# Patient Record
Sex: Female | Born: 1960
Health system: Southern US, Community
[De-identification: ages and names within clinical notes are randomized; demographics above are authoritative.]

## PROBLEM LIST (undated history)

## (undated) DIAGNOSIS — F329 Major depressive disorder, single episode, unspecified: Secondary | ICD-10-CM

## (undated) DIAGNOSIS — A549 Gonococcal infection, unspecified: Secondary | ICD-10-CM

## (undated) DIAGNOSIS — Z8742 Personal history of other diseases of the female genital tract: Secondary | ICD-10-CM

## (undated) DIAGNOSIS — R197 Diarrhea, unspecified: Secondary | ICD-10-CM

## (undated) DIAGNOSIS — M109 Gout, unspecified: Secondary | ICD-10-CM

## (undated) DIAGNOSIS — F419 Anxiety disorder, unspecified: Secondary | ICD-10-CM

## (undated) DIAGNOSIS — Z8619 Personal history of other infectious and parasitic diseases: Secondary | ICD-10-CM

## (undated) DIAGNOSIS — K219 Gastro-esophageal reflux disease without esophagitis: Secondary | ICD-10-CM

## (undated) DIAGNOSIS — J449 Chronic obstructive pulmonary disease, unspecified: Secondary | ICD-10-CM

## (undated) DIAGNOSIS — Z8489 Family history of other specified conditions: Secondary | ICD-10-CM

## (undated) DIAGNOSIS — R87619 Unspecified abnormal cytological findings in specimens from cervix uteri: Secondary | ICD-10-CM

## (undated) DIAGNOSIS — F4323 Adjustment disorder with mixed anxiety and depressed mood: Secondary | ICD-10-CM

## (undated) DIAGNOSIS — M722 Plantar fascial fibromatosis: Secondary | ICD-10-CM

## (undated) DIAGNOSIS — F32A Depression, unspecified: Secondary | ICD-10-CM

## (undated) DIAGNOSIS — I10 Essential (primary) hypertension: Secondary | ICD-10-CM

## (undated) HISTORY — PX: OTHER SURGICAL HISTORY: SHX169

## (undated) HISTORY — PX: TUBAL LIGATION: SHX77

---

## 2006-06-17 ENCOUNTER — Emergency Department: Payer: Self-pay | Admitting: Internal Medicine

## 2007-06-09 ENCOUNTER — Emergency Department: Payer: Self-pay | Admitting: Emergency Medicine

## 2007-07-18 ENCOUNTER — Emergency Department: Payer: Self-pay | Admitting: Emergency Medicine

## 2007-07-18 ENCOUNTER — Other Ambulatory Visit: Payer: Self-pay

## 2007-07-18 IMAGING — CR DG CHEST 1V
1 series · 1 of 1 positions shown · non-contrast
Comparison: none

REASON FOR EXAM: COUGH
COMMENTS:

PROCEDURE:     DXR - DXR CHEST 1 VIEWAP OR PA  - [DATE]  [DATE]
RESULT:     The lungs are mildly hyperinflated. There is no focal
infiltrate. The heart is normal in size. The pulmonary vascularity is not
engorged.

[view not recorded]
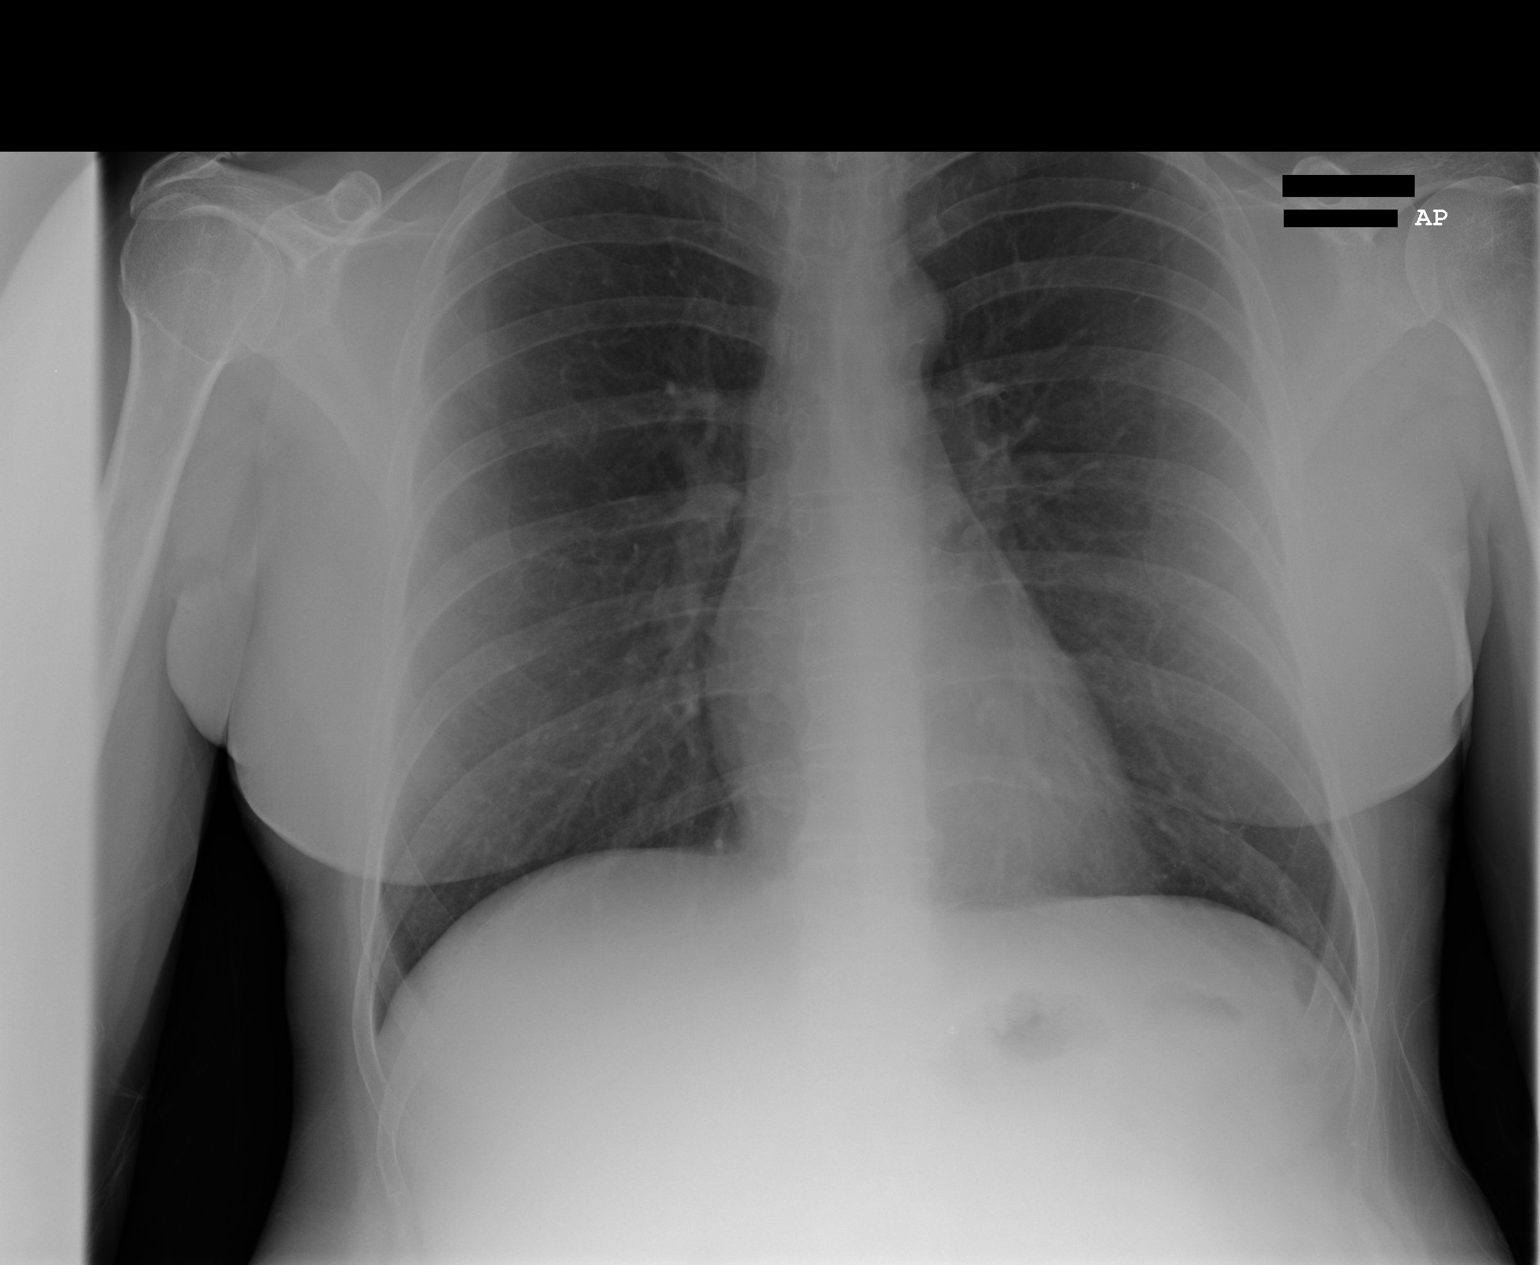

[1 of 1 positions shown; findings below may reference images not displayed]

IMPRESSION: There is hyperinflation consistent with COPD or reactive
airway disease. I do not see evidence of CHF nor of pneumonia.

## 2007-07-18 IMAGING — CT CT HEAD WITHOUT CONTRAST
2 series · 16 of 30 positions shown, 20 images · non-contrast
Comparison: none

REASON FOR EXAM: DIZZINESS, HEADACHE
COMMENTS:

PROCEDURE:     CT  - CT HEAD WITHOUT CONTRAST  - [DATE]  [DATE]
RESULT:     The patient has a history of dizziness and headache.
TECHNIQUE: Nonenhanced head CT is performed.
There are no prior studies available for comparison.

[Series 2: without · axial · non-contrast · 0.39mm/px · z∈[-185,-65]mm · 13 of 30 slices shown, 17 images]
[im 3/30  brain]
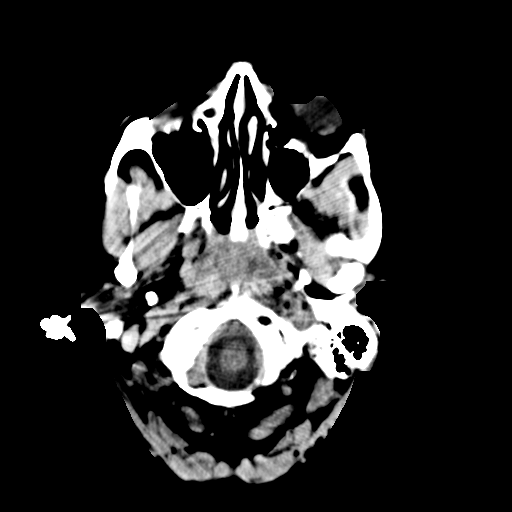
[im 3/30  bone]
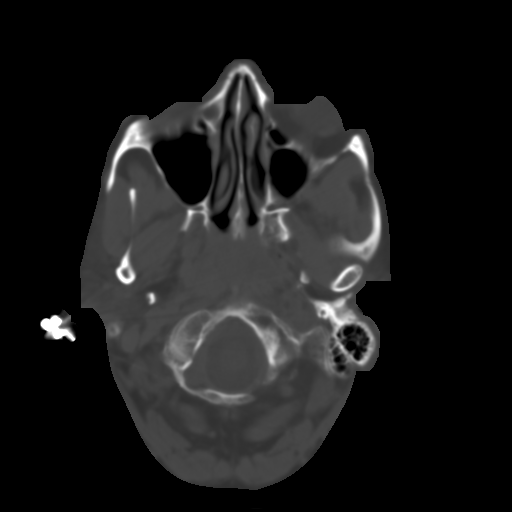
[im 5/30  brain]
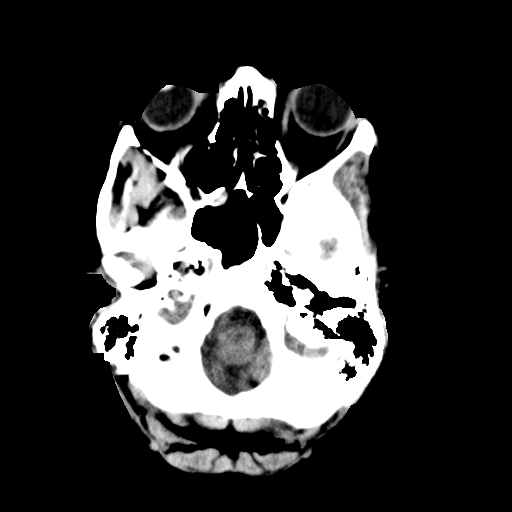
[im 7/30  brain]
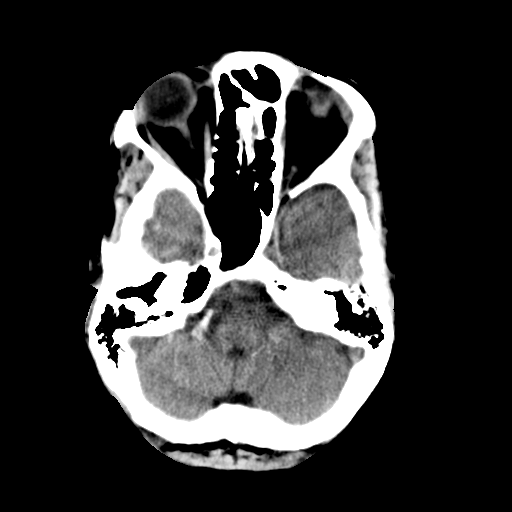
[im 9/30  brain]
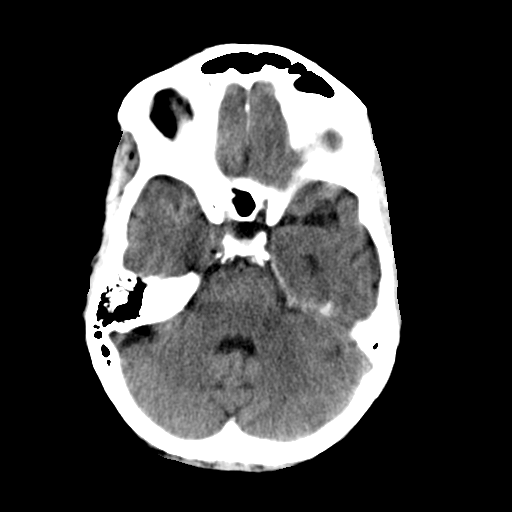
[im 11/30  brain]
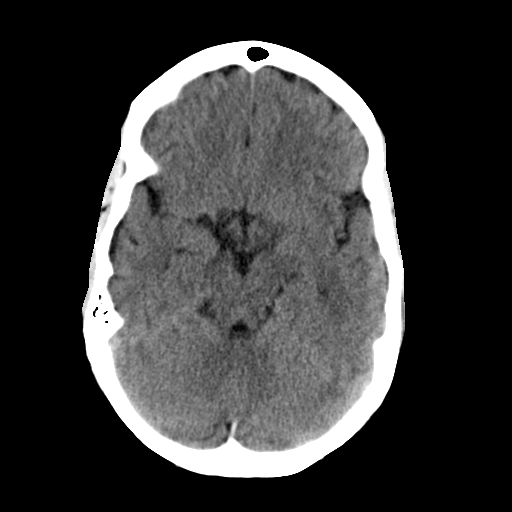
[im 11/30  bone]
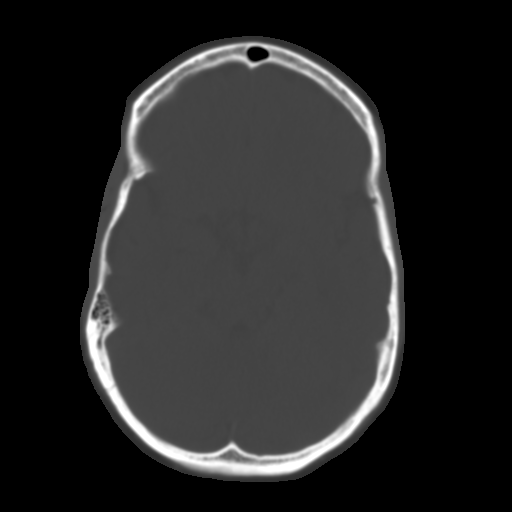
[im 13/30  brain]
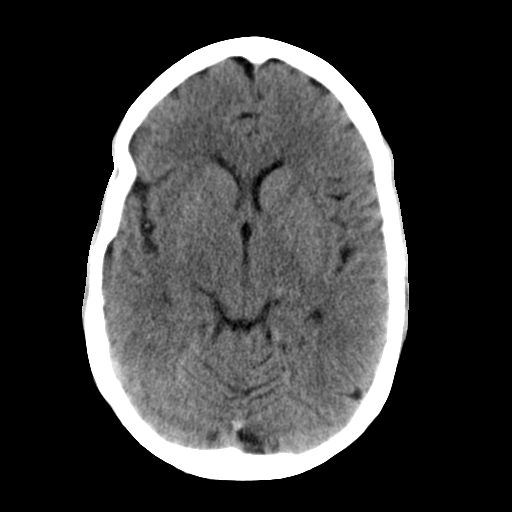
[im 15/30  brain]
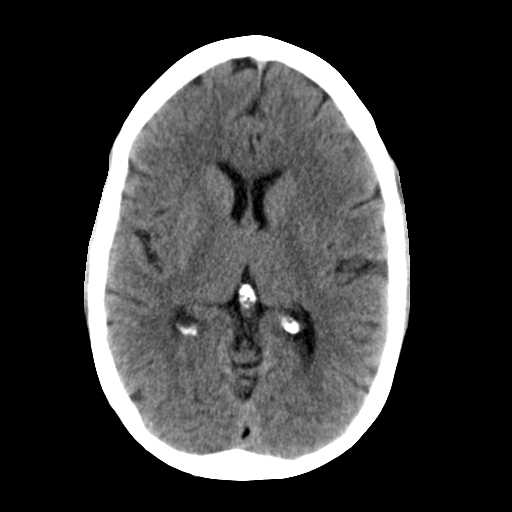
[im 17/30  brain]
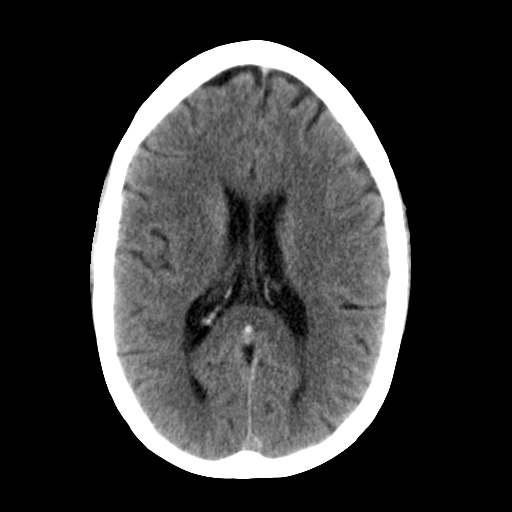
[im 19/30  brain]
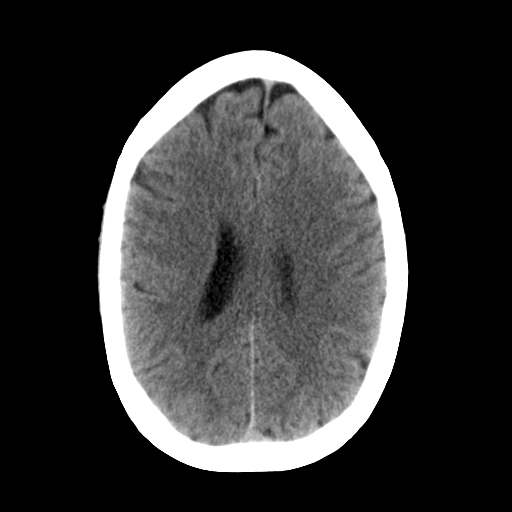
[im 19/30  bone]
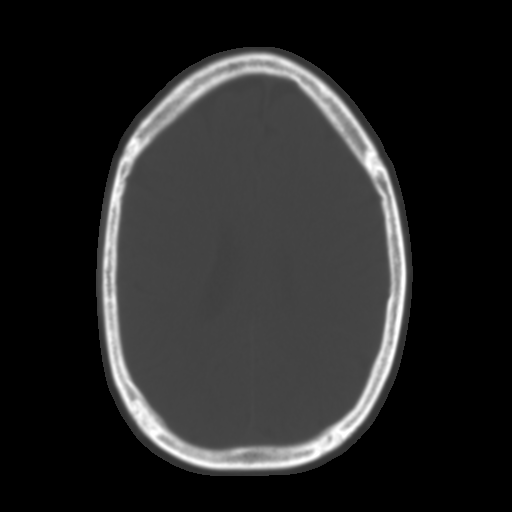
[im 21/30  brain]
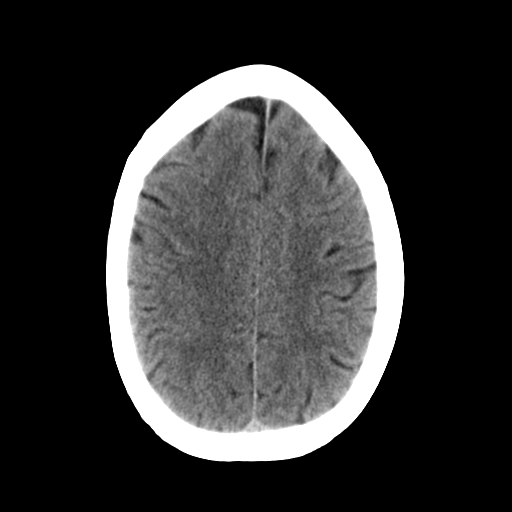
[im 23/30  brain]
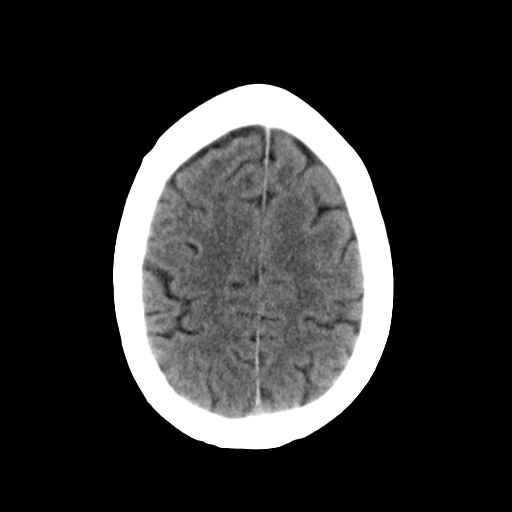
[im 25/30  brain]
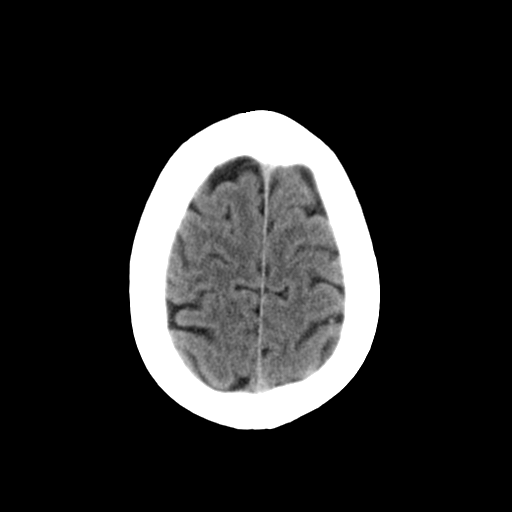
[im 27/30  brain]
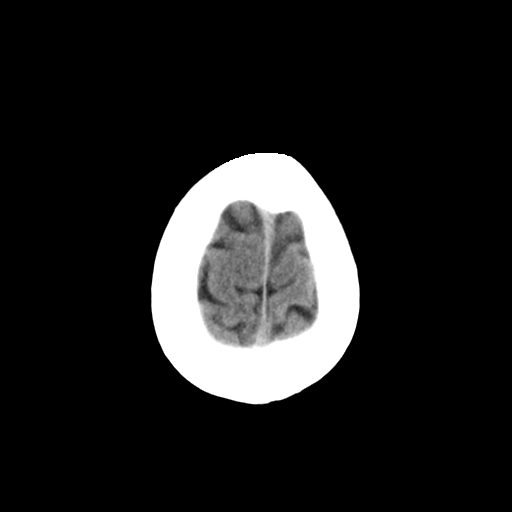
[im 27/30  bone]
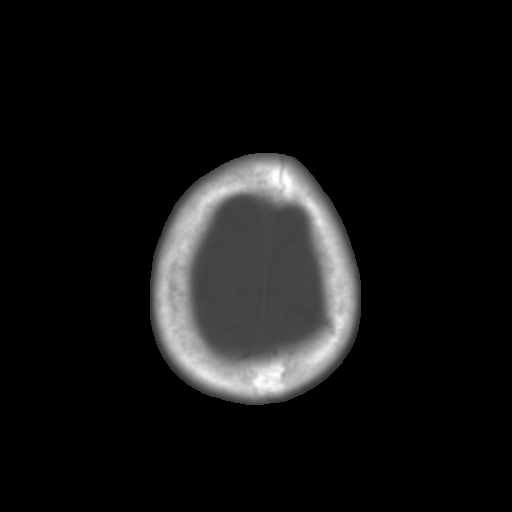

[Series 3: bone · axial · 0.39mm/px · z∈[-185,-145]mm · 3 of 30 slices shown]
[im 3/30  bone]
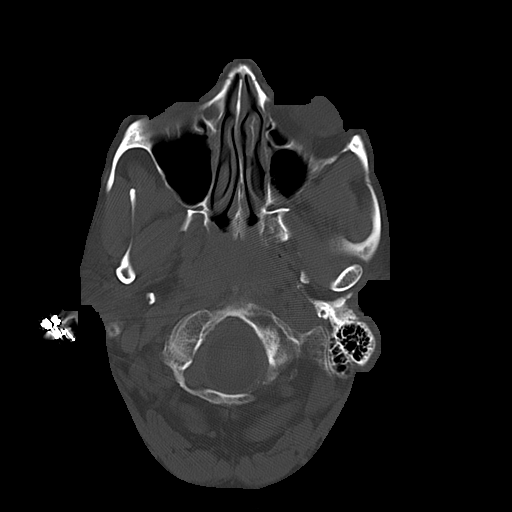
[im 7/30  bone]
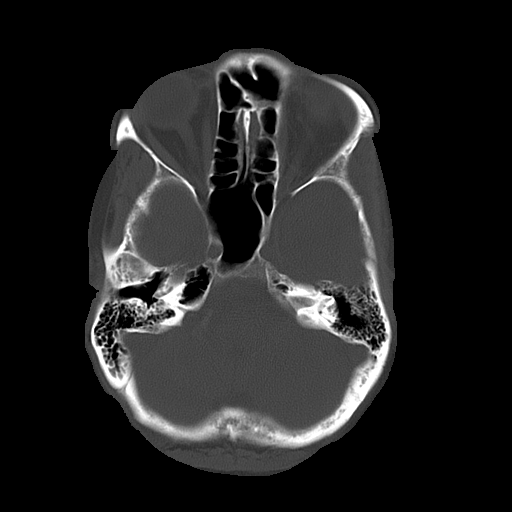
[im 11/30  bone]
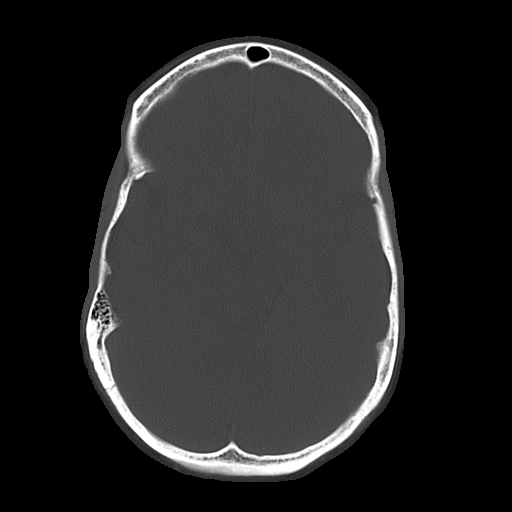

[16 of 30 positions shown; findings below may reference images not displayed]

FINDINGS: No intra-axial or extra-axial pathologic fluid or blood
collections are identified. No mass lesion is noted. There is no
hydrocephalus. A mucous retention cyst is noted in the LEFT maxillary sinus.
IMPRESSION: No acute intracranial abnormality is identified.

## 2007-10-15 ENCOUNTER — Emergency Department: Payer: Self-pay | Admitting: Emergency Medicine

## 2007-10-15 IMAGING — CR SACRUM AND COCCYX - 2+ VIEW
1 series · 4 of 4 positions shown · non-contrast
Comparison: none

REASON FOR EXAM: Fall, injury
COMMENTS:   LMP: 3 years ago

[Series 1: view not recorded · 0.17mm/px · 4 of 4 slices shown]
[im 1/4]
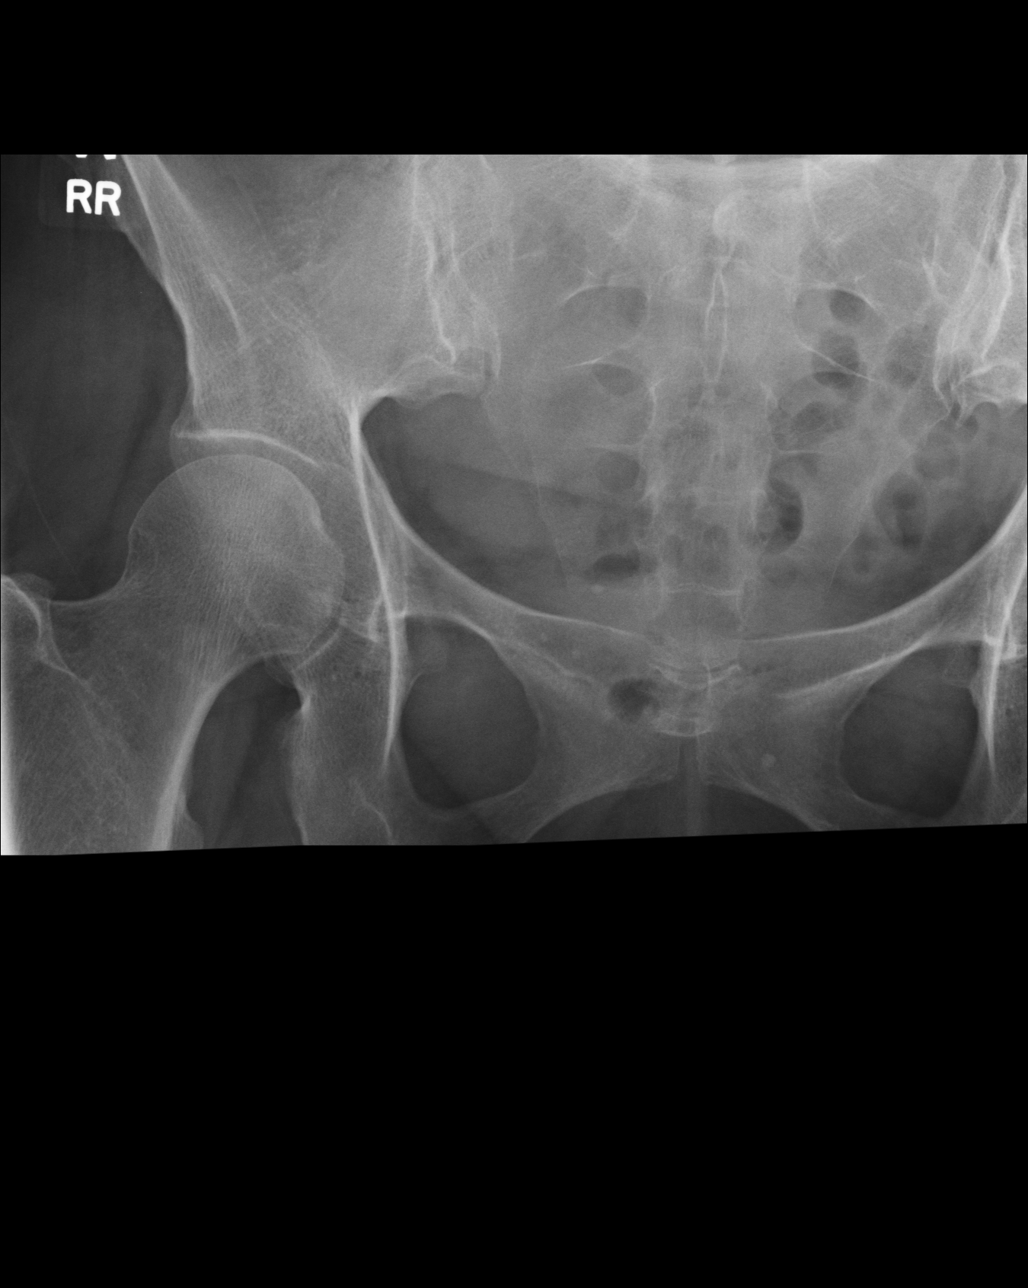
[im 2/4]
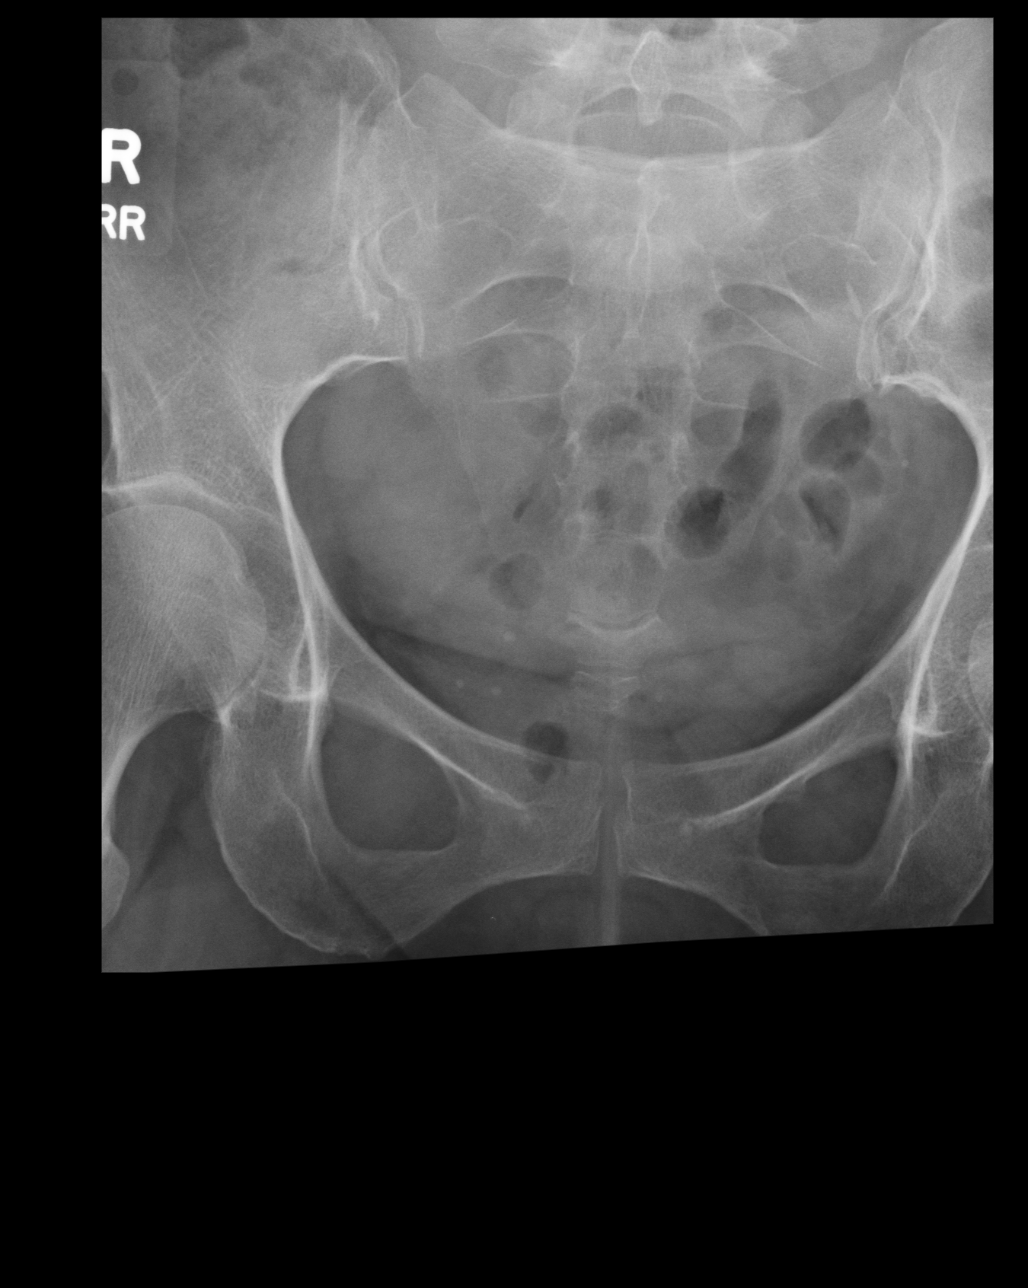
[im 3/4]
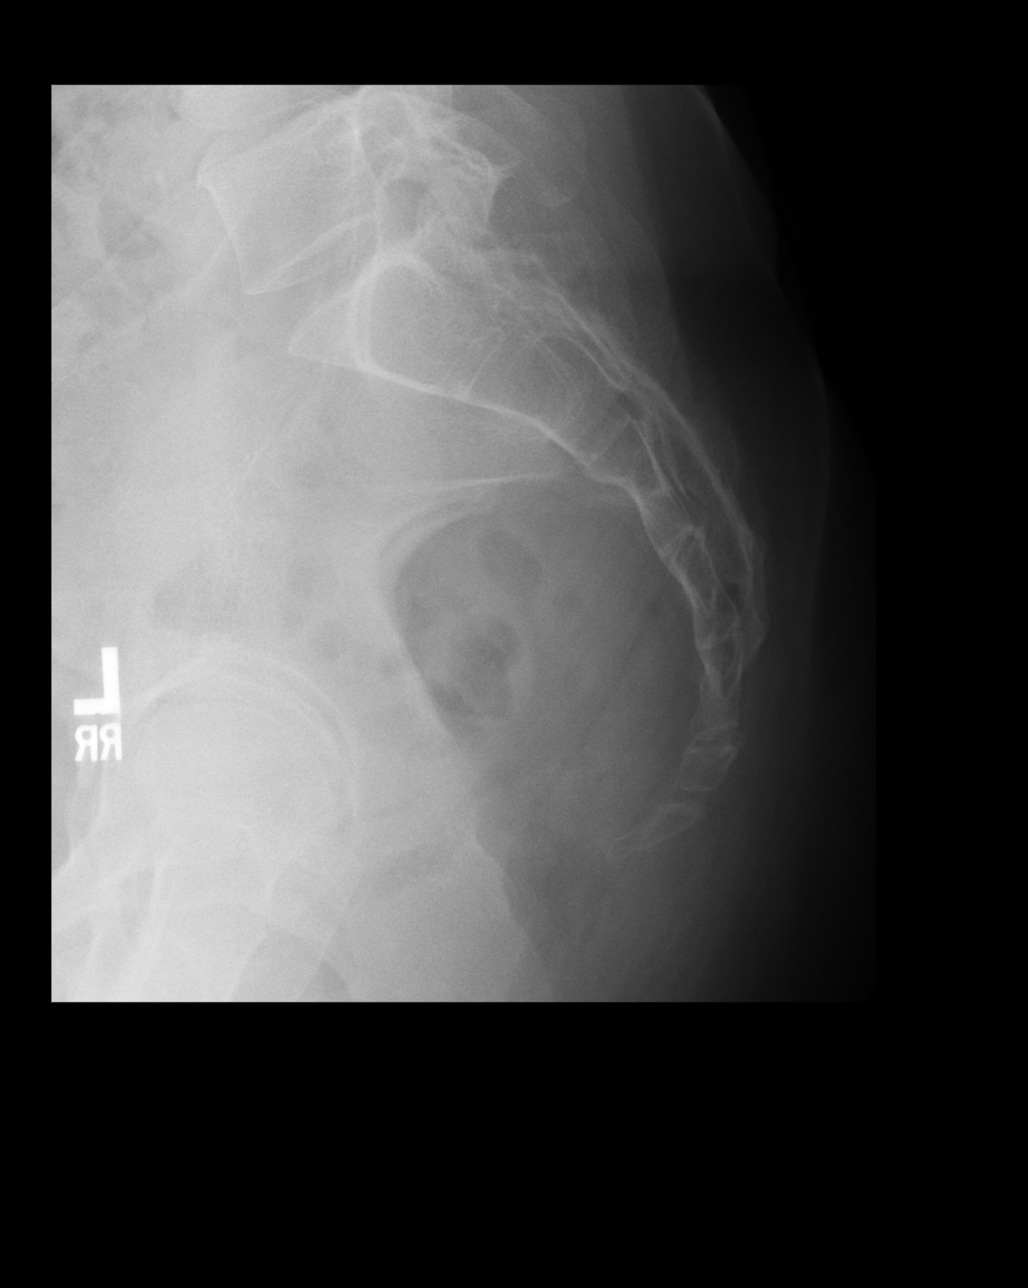
[im 4/4]
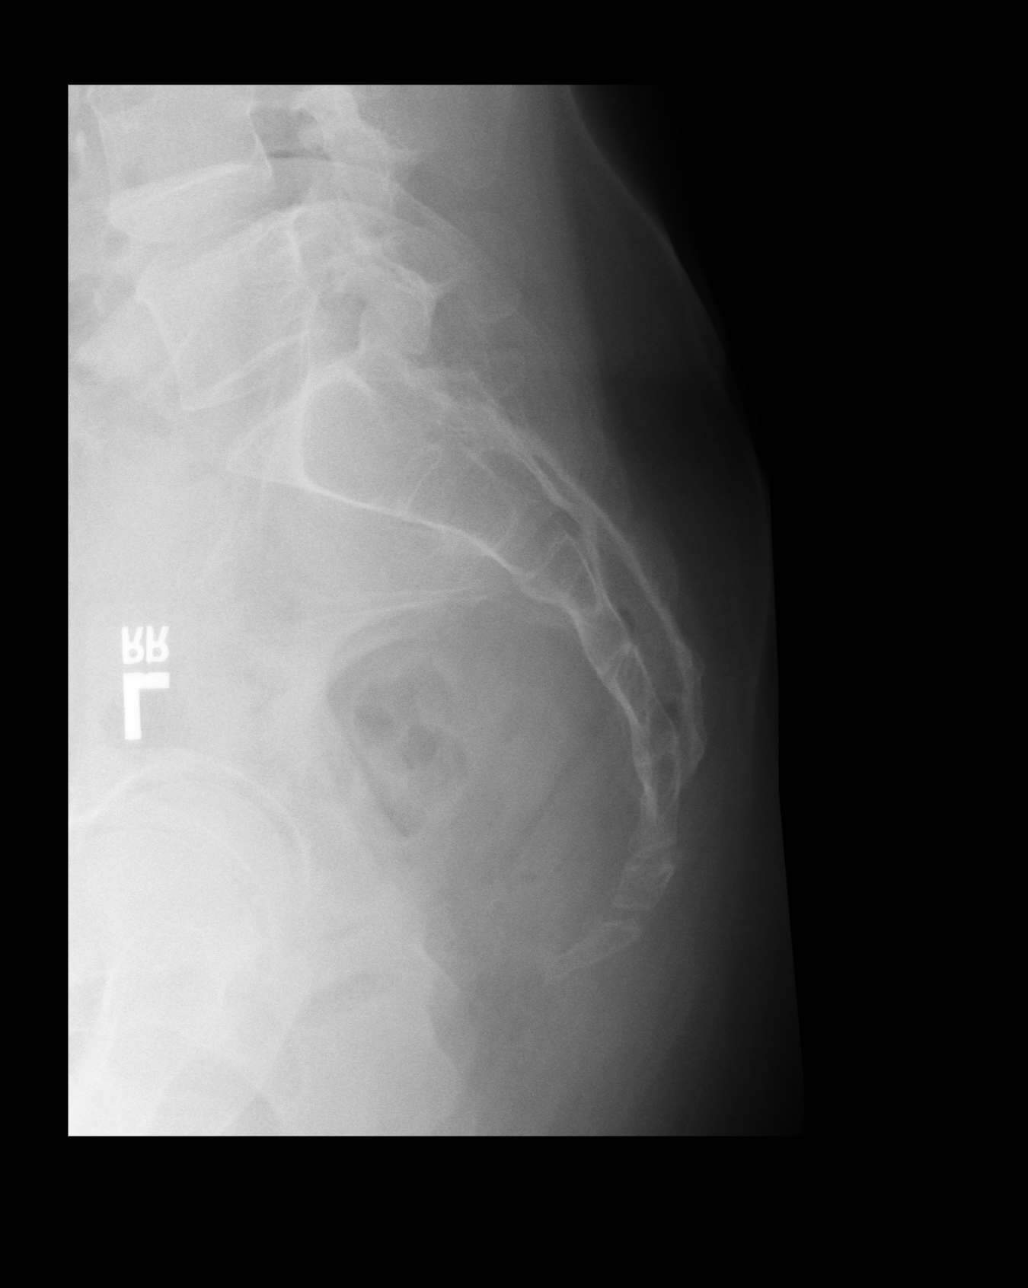

[4 of 4 positions shown; findings below may reference images not displayed]

PROCEDURE:     DXR - DXR SACRUM AND COCCYX  - [DATE]  [DATE]

RESULT:     Anterior and lateral views of the sacrum and coccyx are
obtained.

There is no previous exam available for comparison.

The sacral arches appear to be intact. The sacral and sacrococcygeal
alignment appears to be normal. No foreign body is evident.
IMPRESSION: No acute radiographic abnormality.

## 2007-10-30 ENCOUNTER — Emergency Department: Payer: Self-pay | Admitting: Emergency Medicine

## 2007-12-06 ENCOUNTER — Ambulatory Visit: Payer: Self-pay

## 2007-12-06 IMAGING — MG MAM [PERSON_NAME] DIG SCREEN W/CAD
1 series · 4 of 4 positions shown · non-contrast
Comparison: none

REASON FOR EXAM: Annual screening
COMMENTS:

[R CC · right · 4 of 4 slices shown]
[im 1/4]
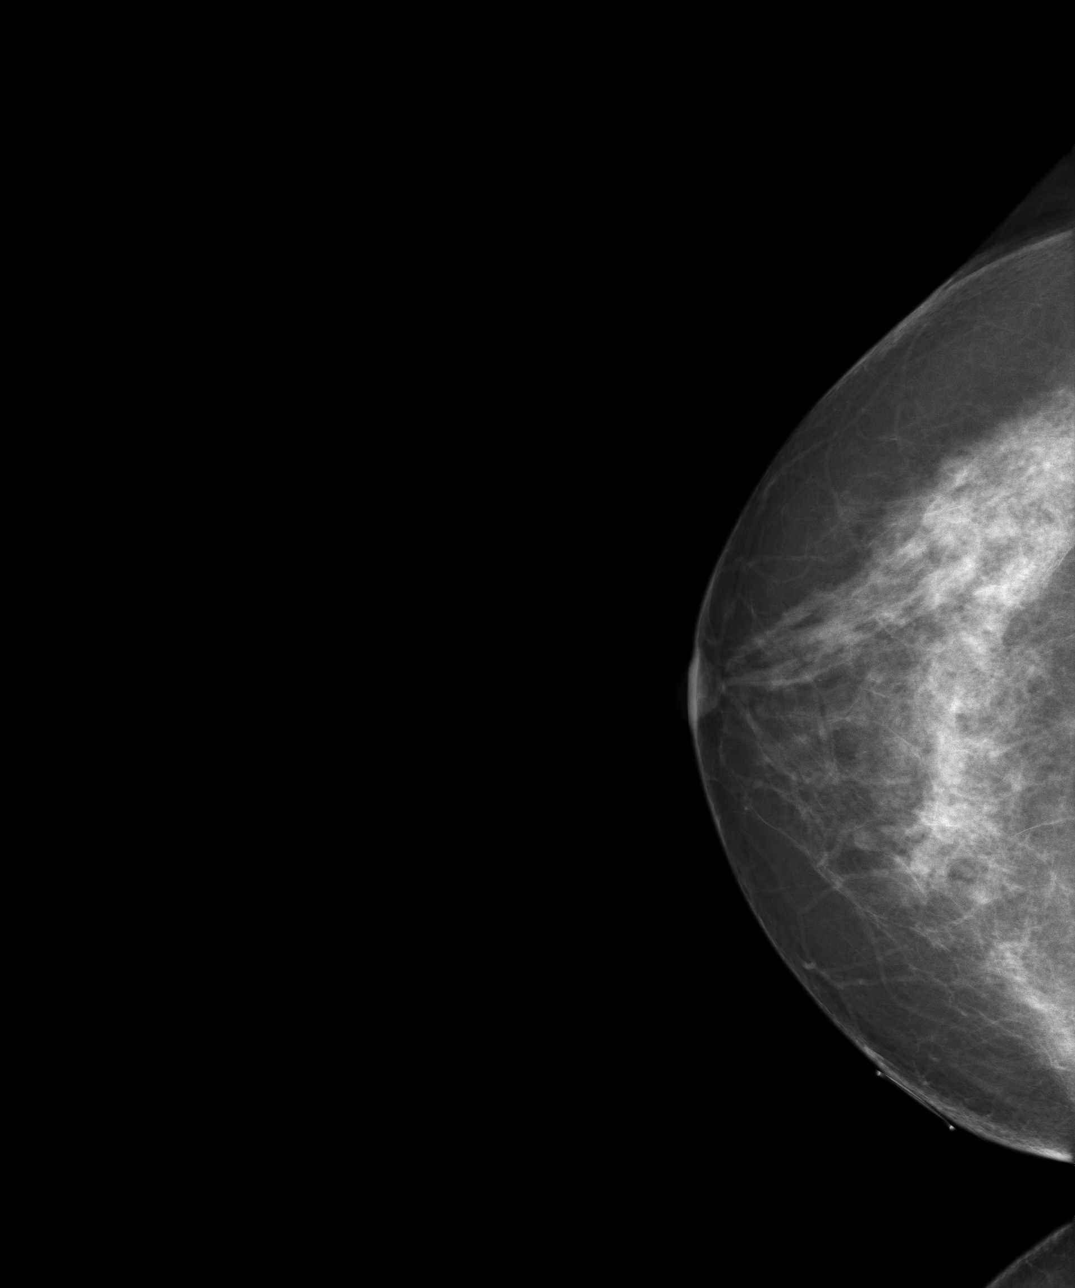
[im 2/4]
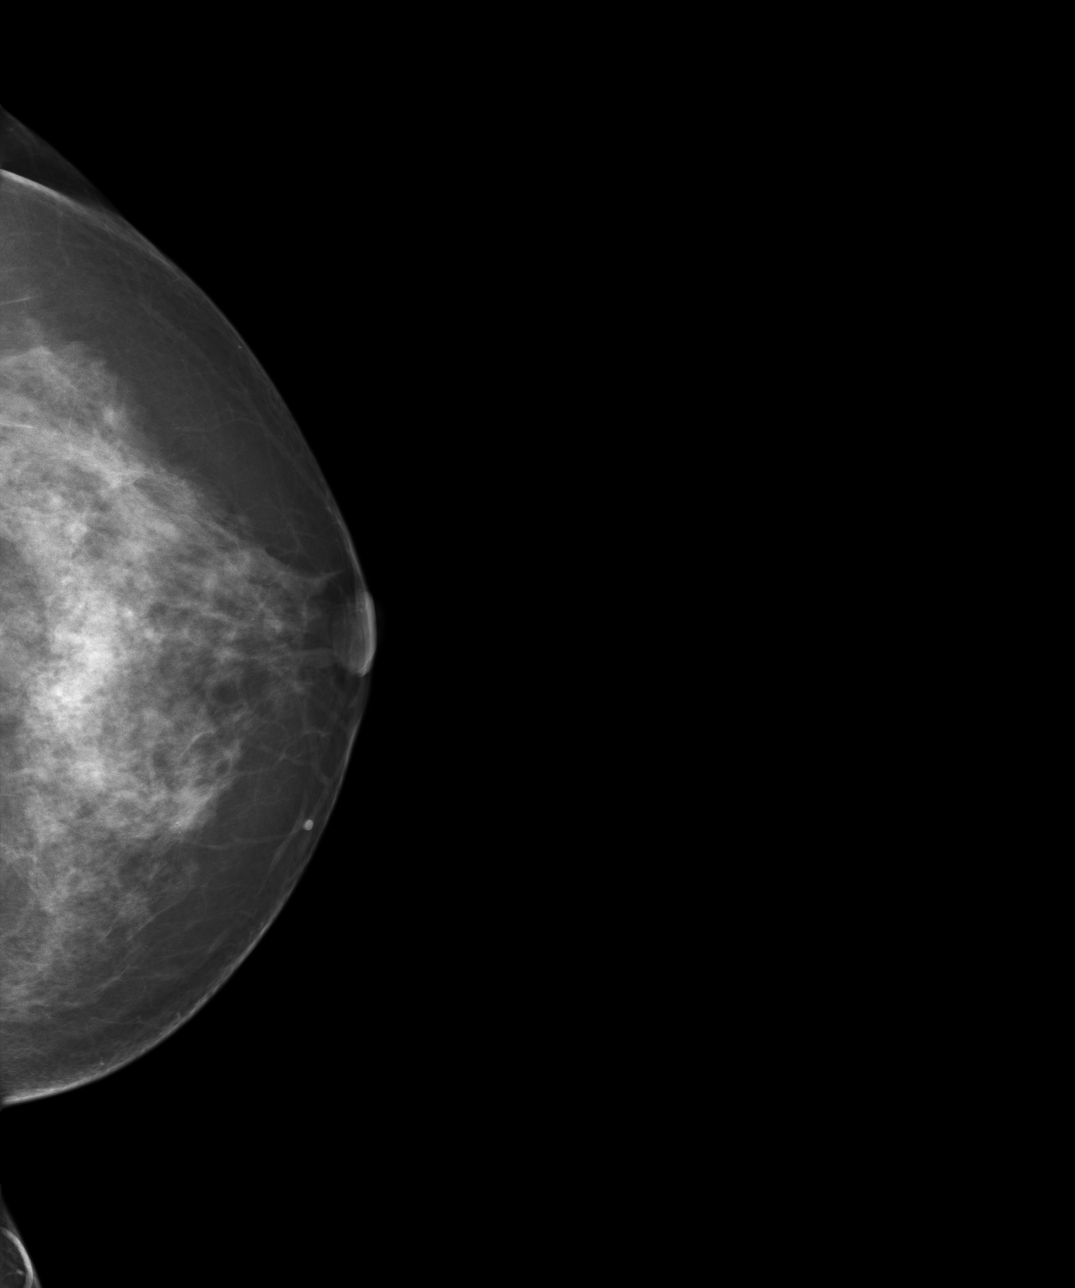
[im 3/4]
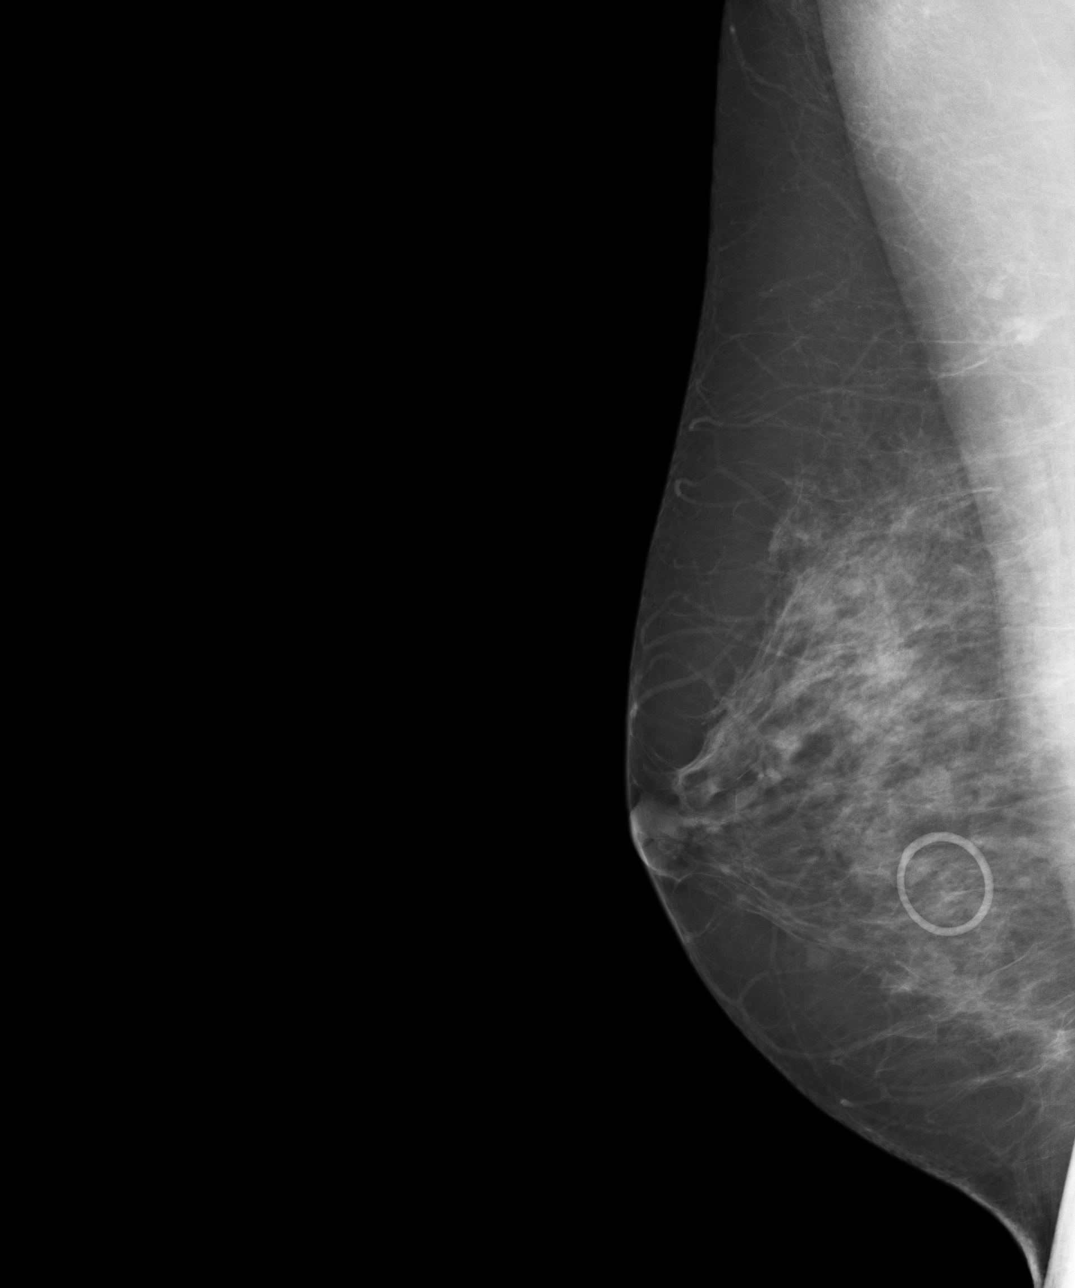
[im 4/4]
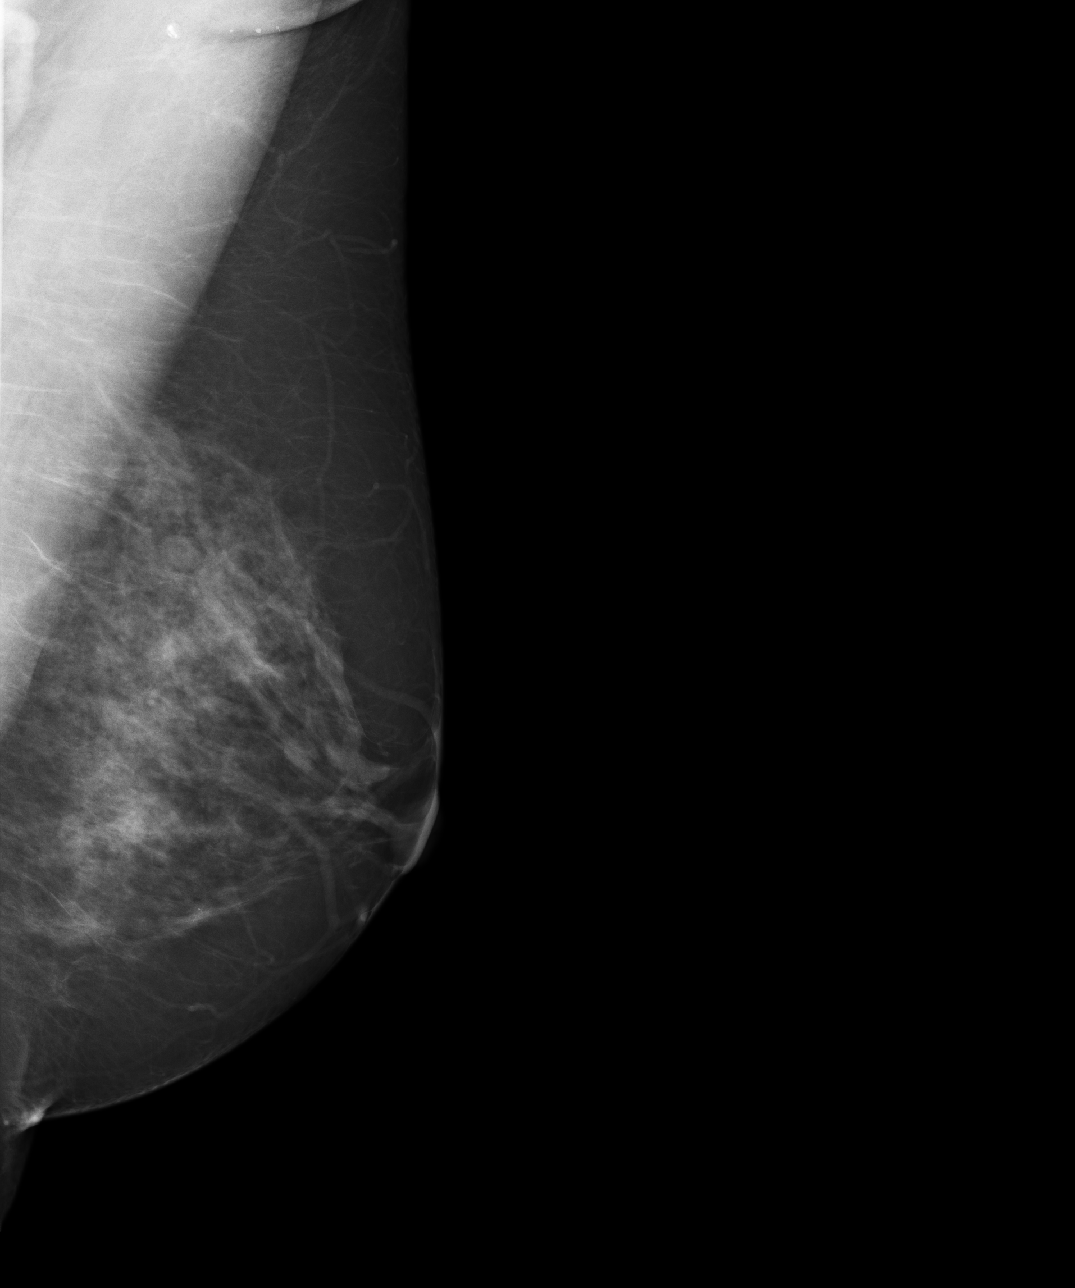

[4 of 4 positions shown; findings below may reference images not displayed]

PROCEDURE:     MAM - MAM [REDACTED] [facility] DIG SCREEN W/CAD  - [DATE]  [DATE]

RESULT:     Comparison is made to prior outside examinations dated
[DATE] and [DATE] from [facility].

The breast parenchyma is heterogeneously dense. No mass or malignant
appearing microcalcifications are seen.
IMPRESSION: 1.     Bilaterally benign appearing screening mammography.
2.     Annual screening mammography is recommended.
3.     BI-RADS: Category 1  Negative.

Thank you for this opportunity to contribute to the care of your patient.

A NEGATIVE MAMMOGRAM REPORT DOES NOT PRECLUDE BIOPSY OR OTHER EVALUATION OF
A CLINICALLY PALPABLE OR OTHERWISE SUSPICIOUS MASS OR LESION. BREAST CANCER
MAY NOT BE DETECTED BY MAMMOGRAPHY IN UP TO 10% OF CASES.

## 2007-12-09 ENCOUNTER — Emergency Department: Payer: Self-pay | Admitting: Emergency Medicine

## 2007-12-09 IMAGING — CR RIGHT HAND - COMPLETE 3+ VIEW
1 series · 3 of 3 positions shown · non-contrast
Comparison: none

REASON FOR EXAM: fall
COMMENTS:

[Series 1: view not recorded · 0.17mm/px · 3 of 3 slices shown]
[im 1/3]
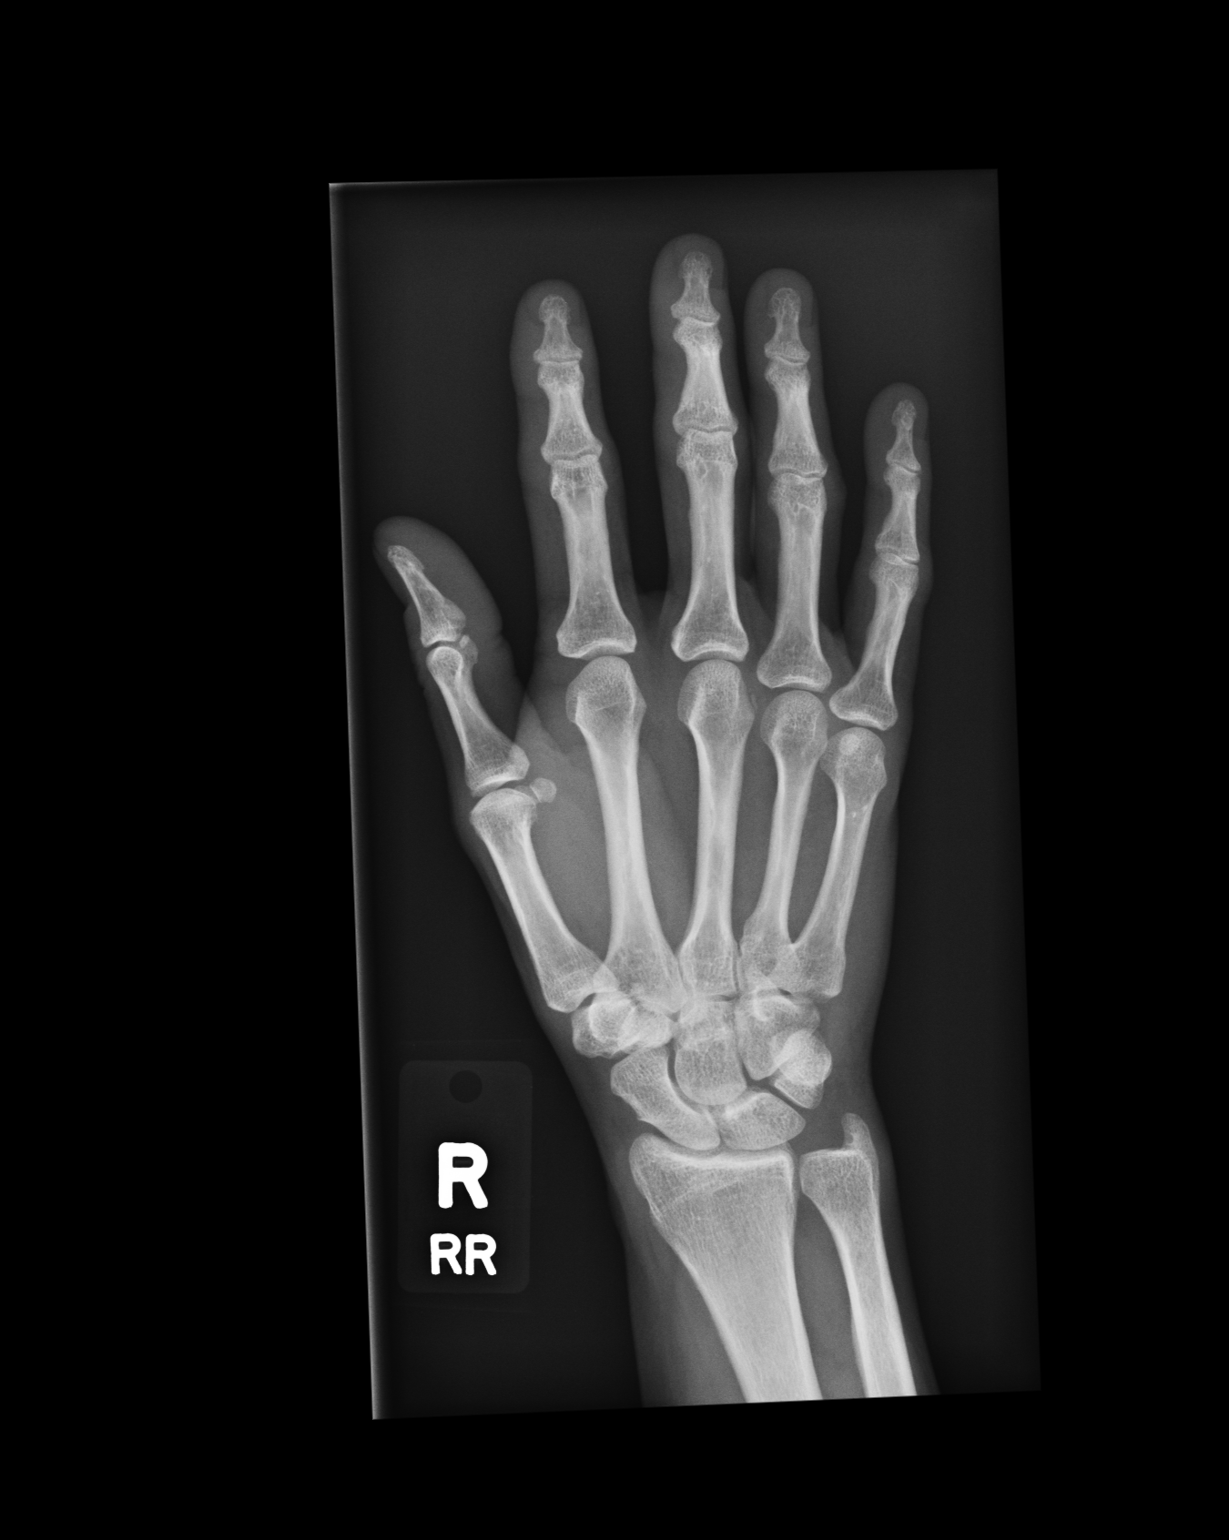
[im 2/3]
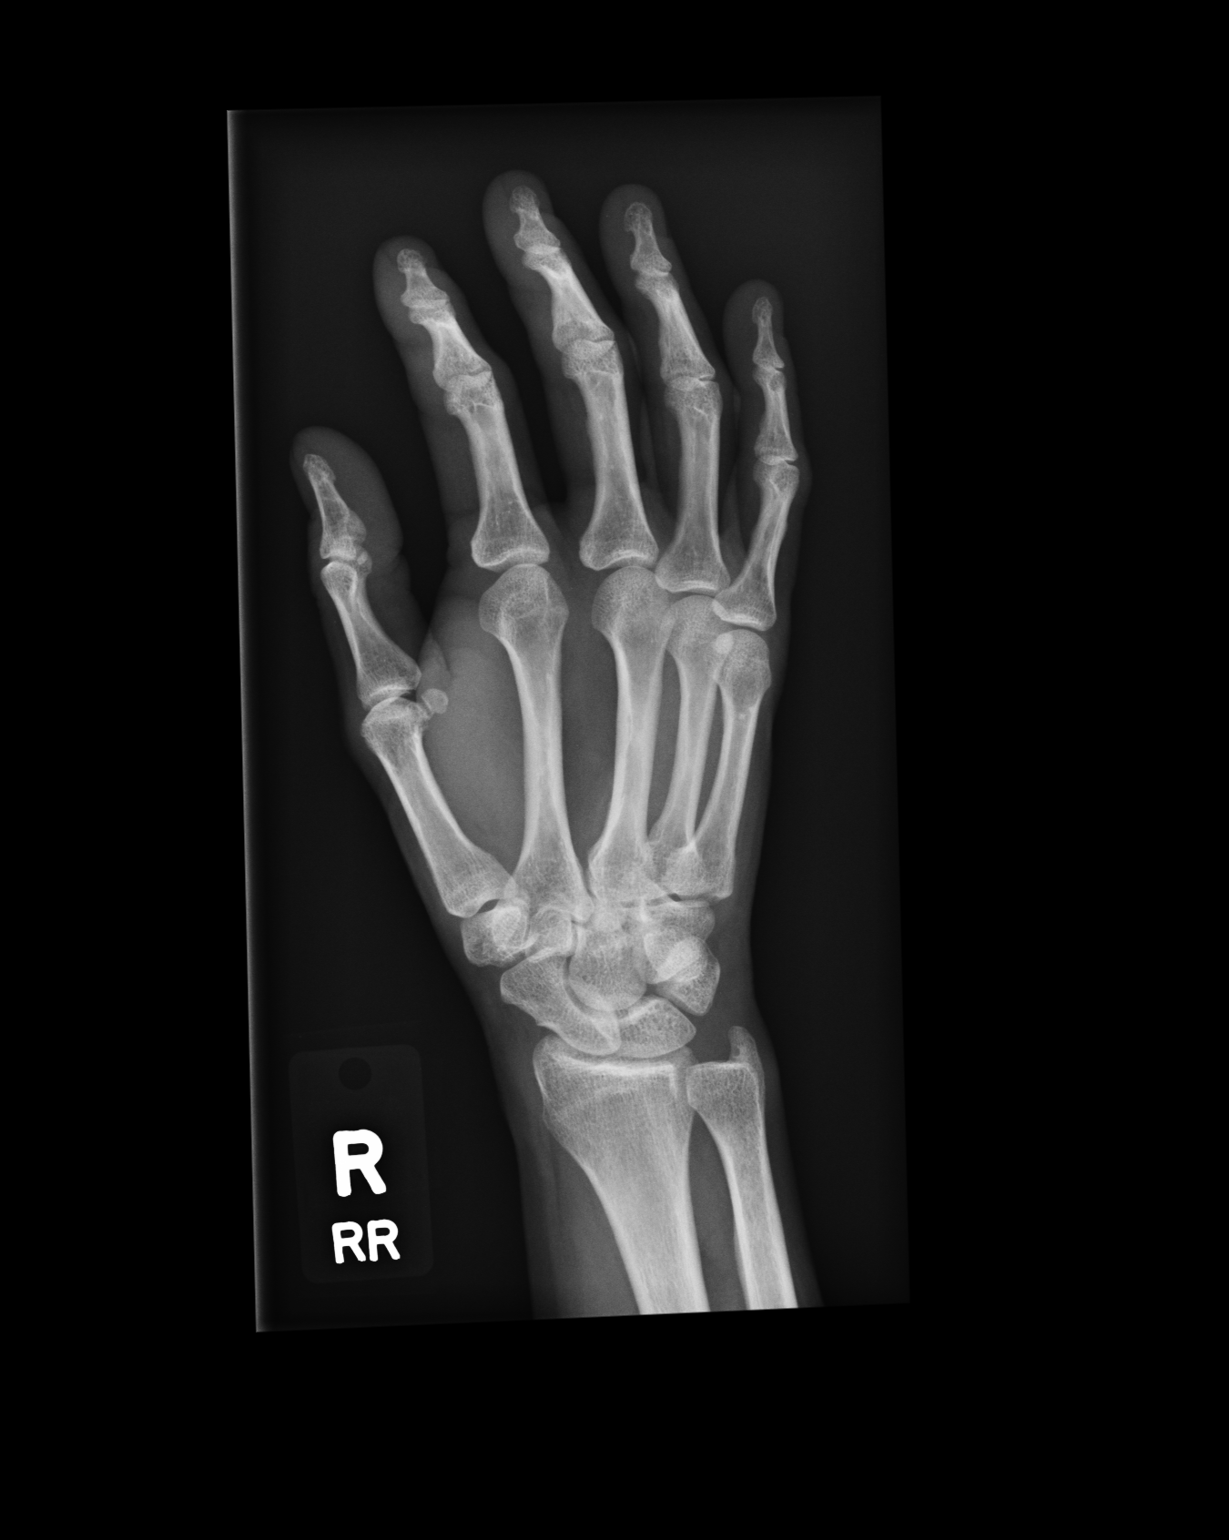
[im 3/3]
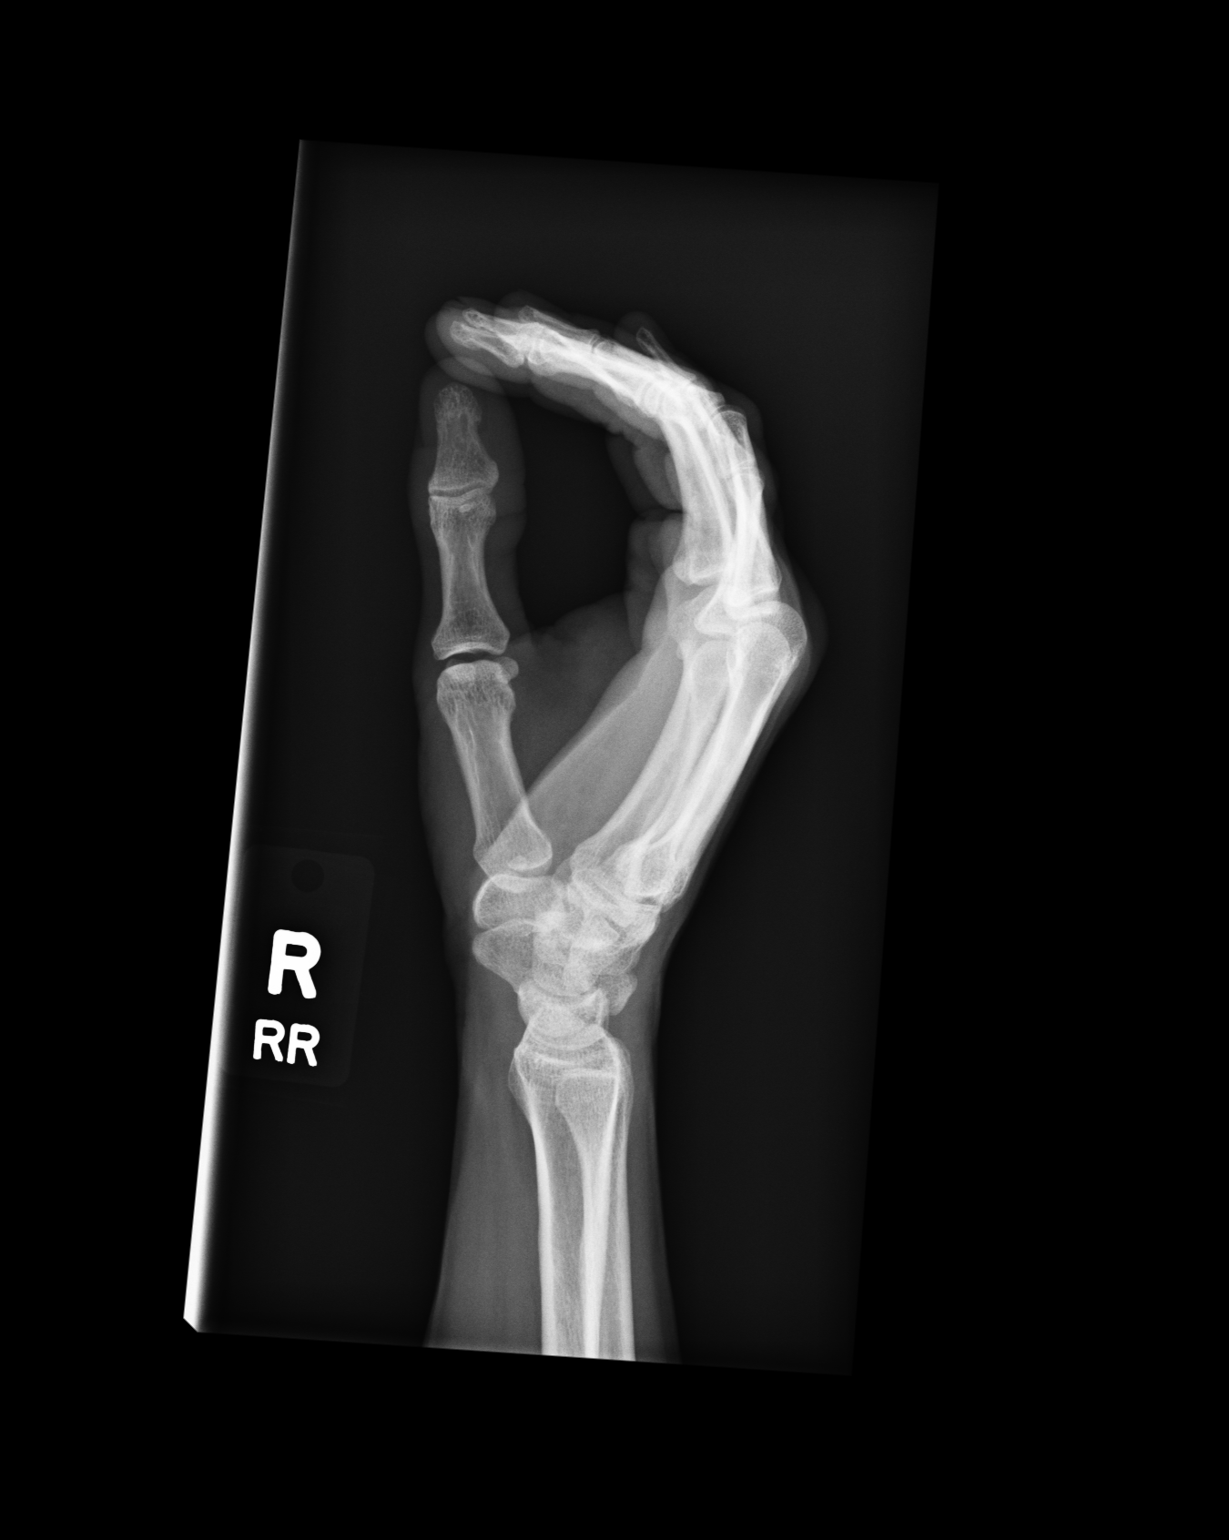

[3 of 3 positions shown; findings below may reference images not displayed]

PROCEDURE:     DXR - DXR HAND RT COMPLETE W/OBLIQUES  - [DATE]  [DATE]

RESULT:     A small fleck of osseous density projects along the ulnar aspect
of the distal third metacarpal.  This may represent a small avulsion
fragment particularly if there is correlation with the patient's signs and
symptoms.  No further evidence of fracture or dislocation is appreciated.
IMPRESSION: Possible distal third metacarpal avulsion fracture as described above.

## 2008-06-29 ENCOUNTER — Emergency Department: Payer: Self-pay | Admitting: Emergency Medicine

## 2008-06-29 IMAGING — CR DG CHEST 2V
1 series · 2 of 2 positions shown · non-contrast
Comparison: none

REASON FOR EXAM: cough chest pain
COMMENTS:   LMP: Post-Menopausal

[Series 1: view not recorded · 0.17mm/px · 2 of 2 slices shown]
[im 1/2]
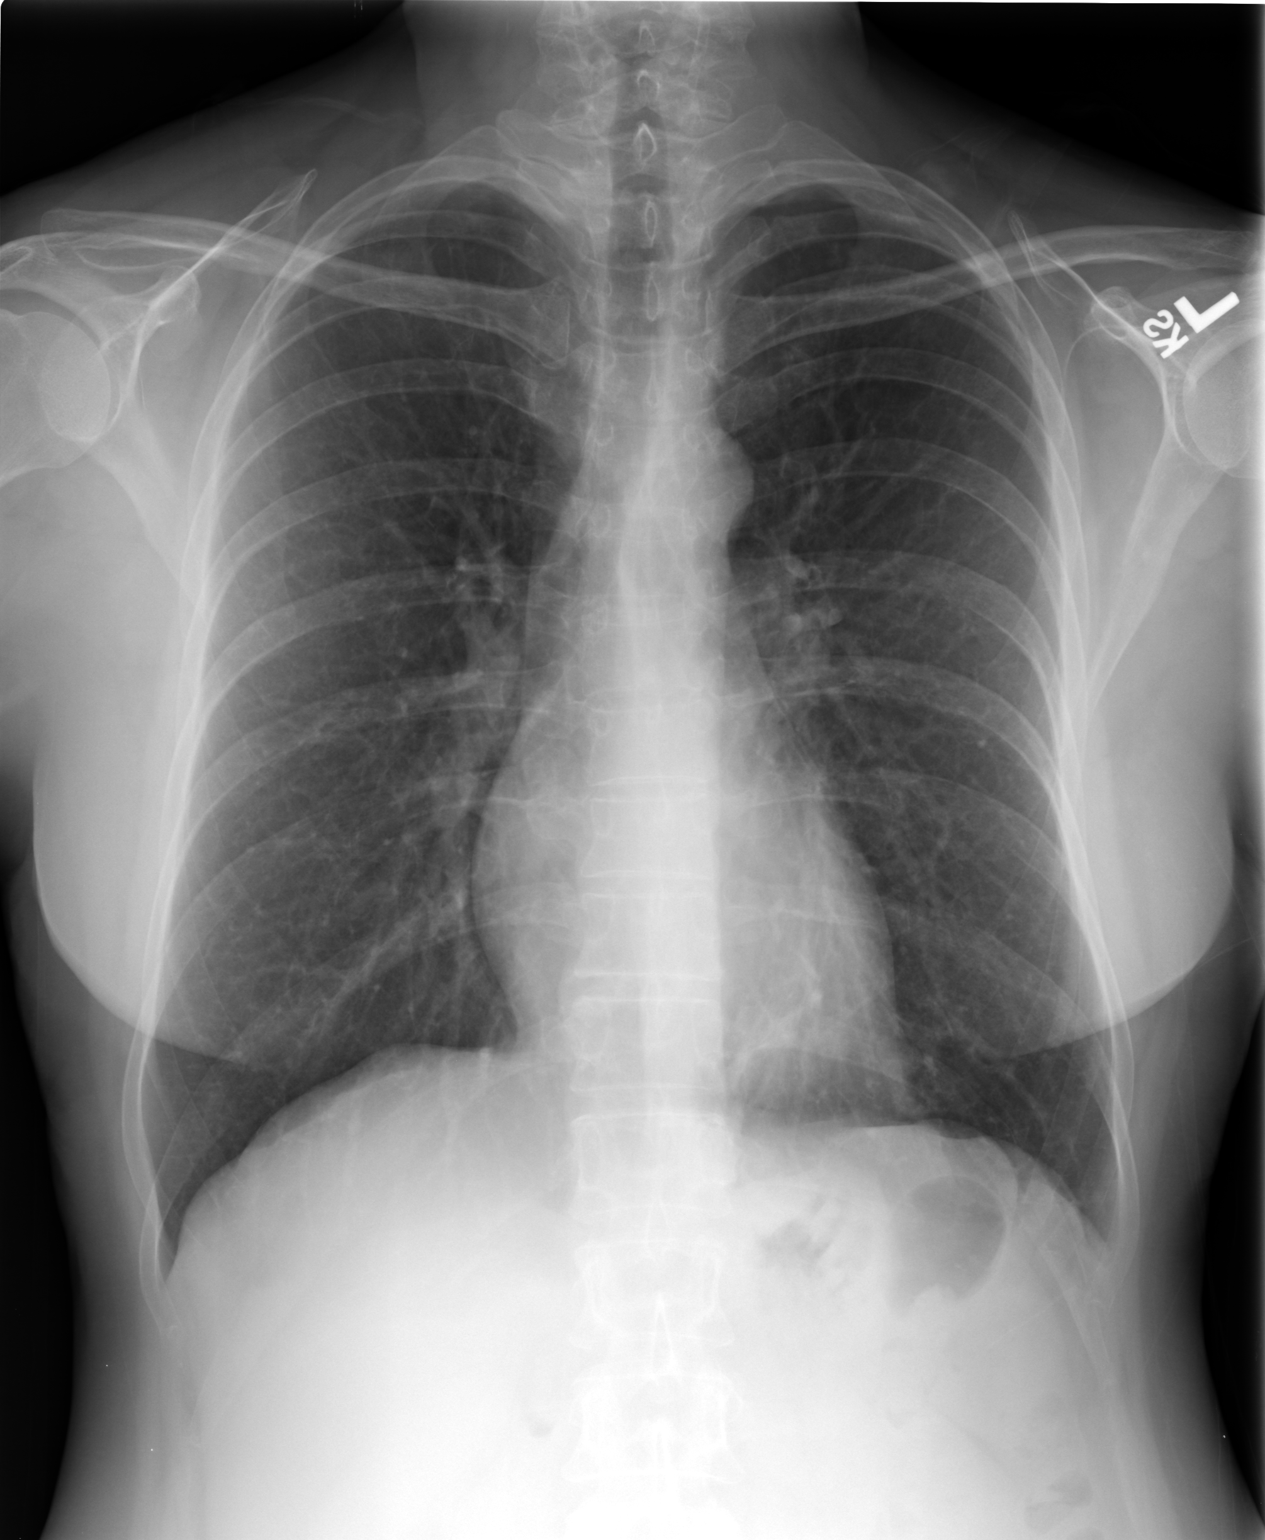
[im 2/2]
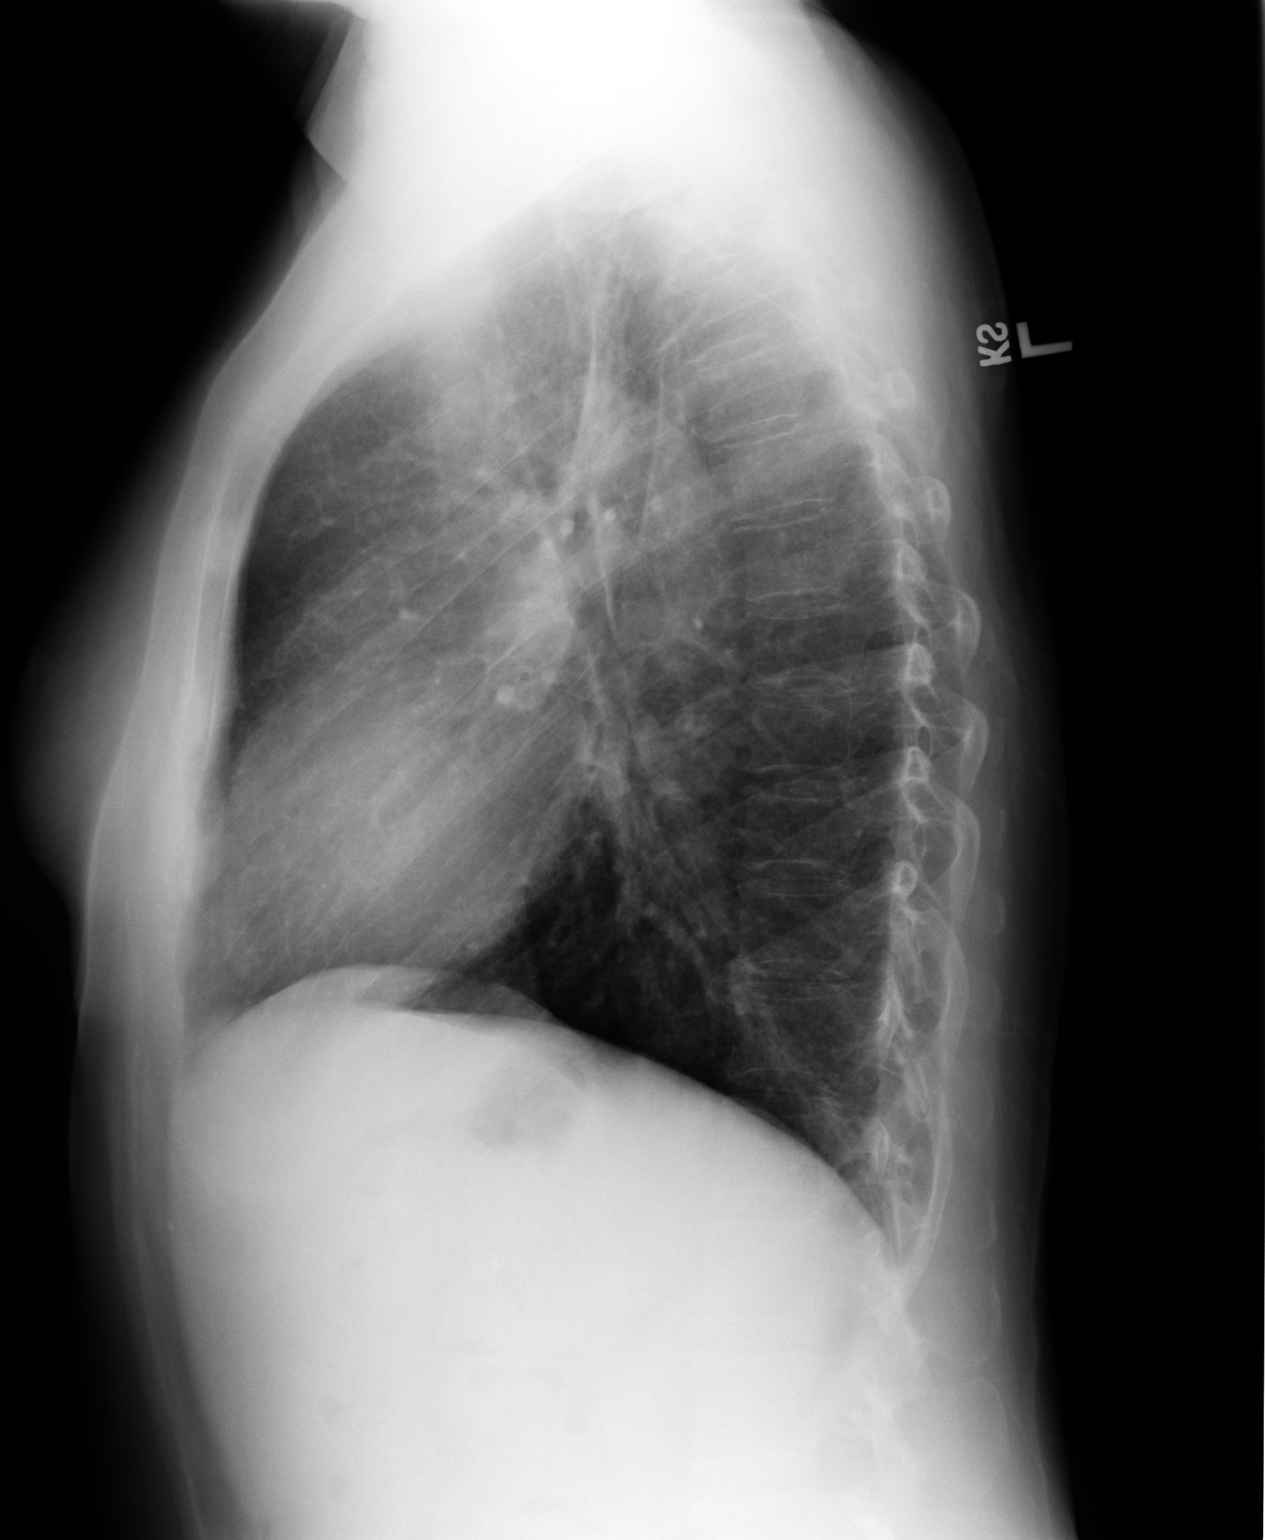

[2 of 2 positions shown; findings below may reference images not displayed]

PROCEDURE:     DXR - DXR CHEST PA (OR AP) AND LATERAL  - [DATE] [DATE]

RESULT:     Comparison is made to the exam of [DATE].

The lungs are clear. The heart and pulmonary vessels are normal. The bony
and mediastinal structures are unremarkable. There is no effusion. There is
no pneumothorax or evidence of congestive failure.
IMPRESSION: No acute cardiopulmonary disease. Stable appearance.

## 2009-01-22 ENCOUNTER — Ambulatory Visit: Payer: Self-pay

## 2009-01-22 IMAGING — US ULTRASOUND LEFT BREAST
1 series · 12 of 12 positions shown · non-contrast
Comparison: none

REASON FOR EXAM: thickening at the left inframmary ridge continuing to
the left rib area
COMMENTS:

[Series 1: ultrasound left breast · 12 of 12 slices shown]
[im 1/12]
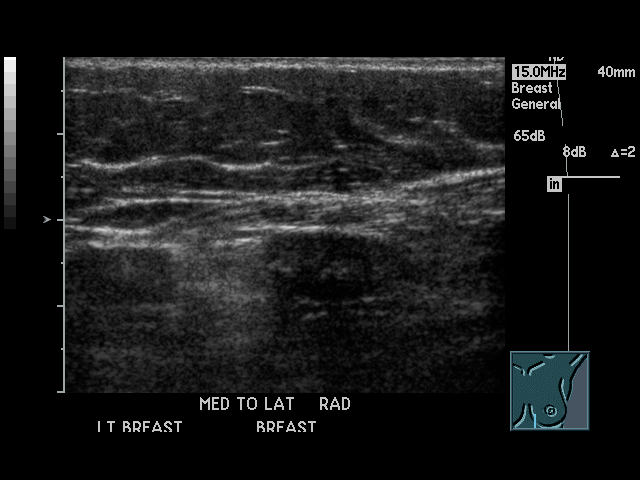
[im 2/12]
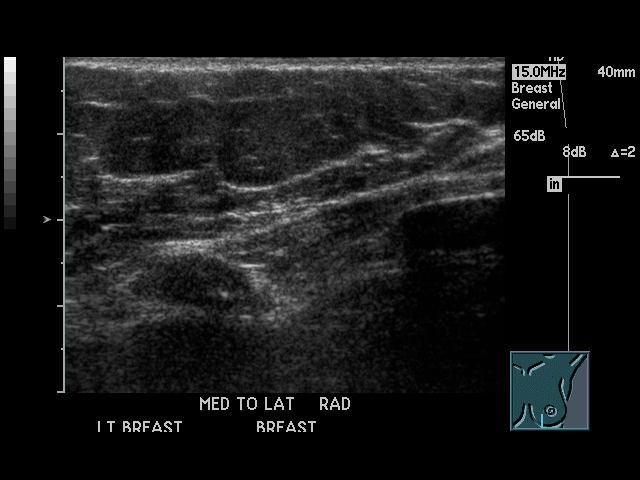
[im 3/12]
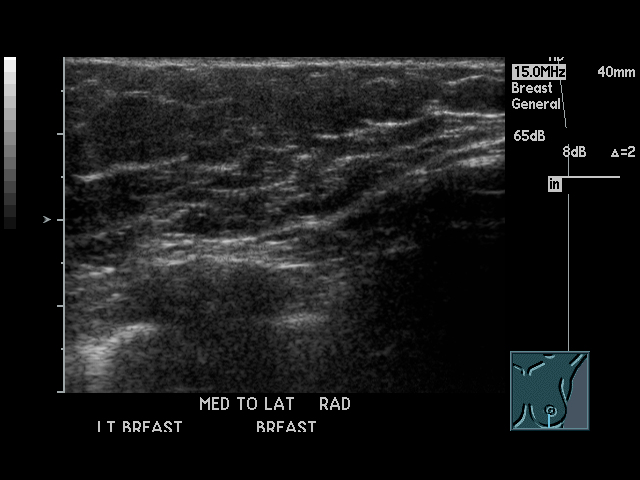
[im 4/12]
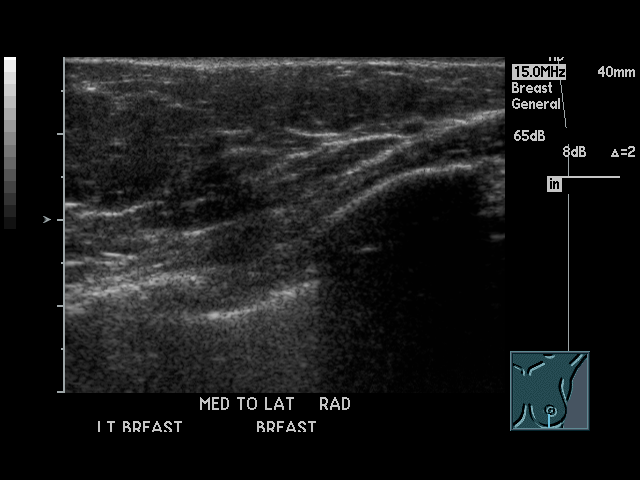
[im 5/12]
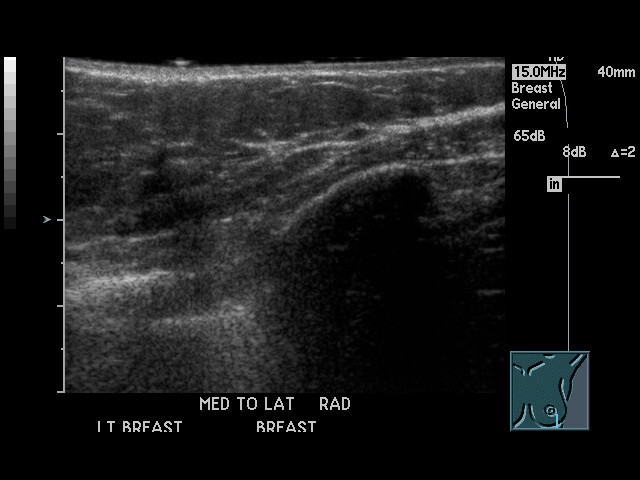
[im 6/12]
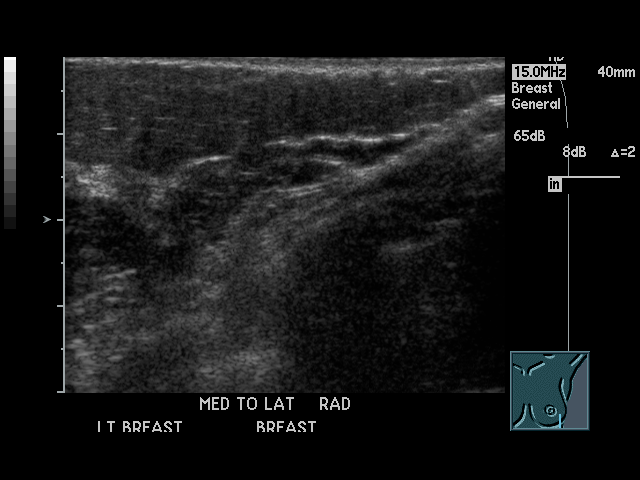
[im 7/12]
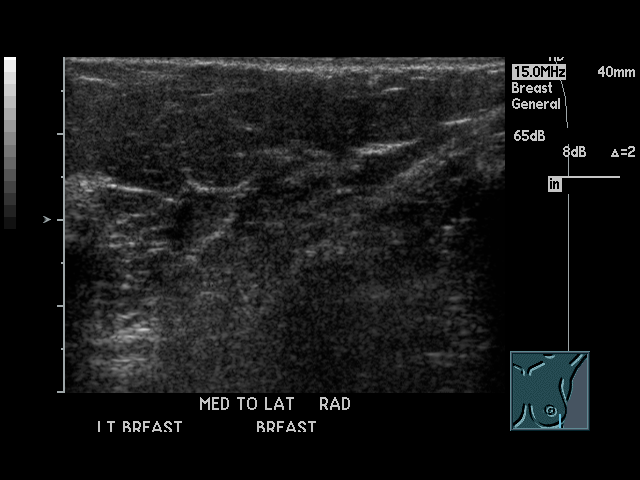
[im 8/12]
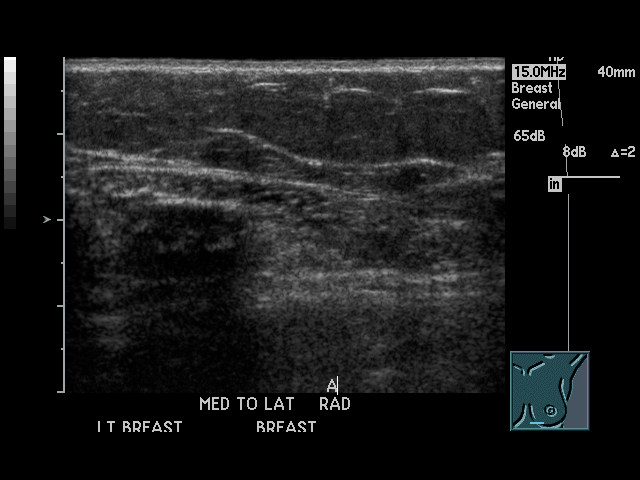
[im 9/12]
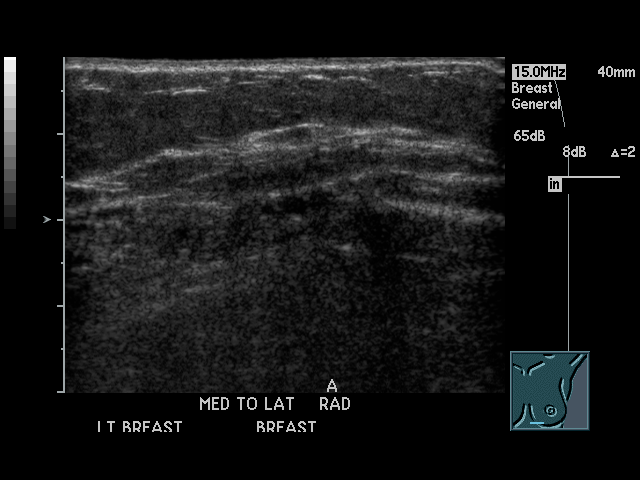
[im 10/12]
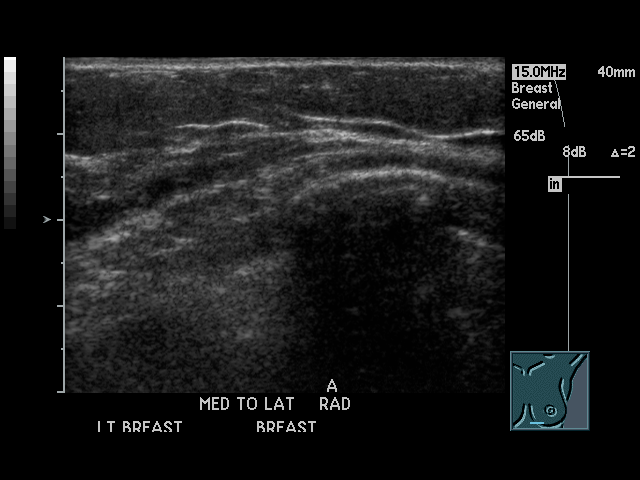
[im 11/12]
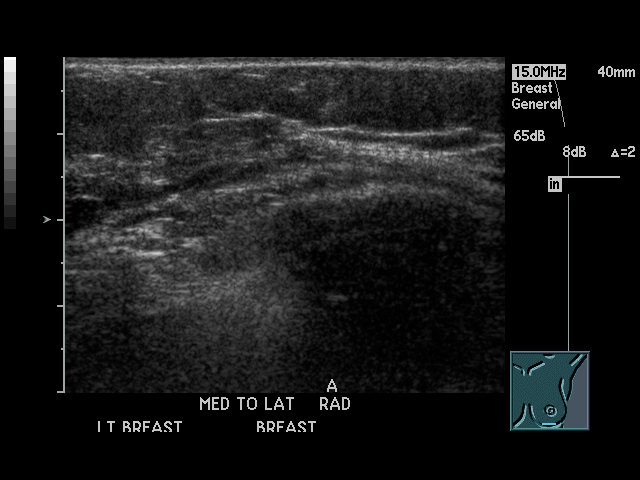
[im 12/12]
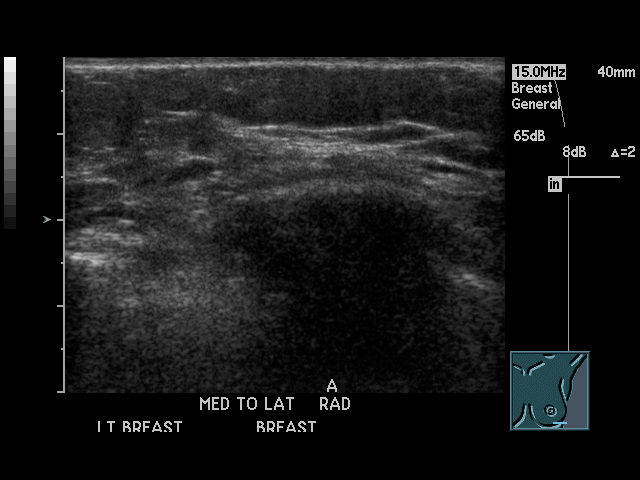

[12 of 12 positions shown; findings below may reference images not displayed]

PROCEDURE:     US  - US LT BREAST ([REDACTED])  - [DATE]  [DATE]

RESULT:       A tiny nodular density is present in the upper portion of the
left breast.  It is suggested the patient return for compression spot films
of the upper portion of the left breast and ultrasound of the upper portion
of the left breast if need be.  No other abnormality is identified.
Ultrasound was performed in the inferior aspect of the left breast in the
region of questionable fullness.  No abnormality is noted on ultrasound or
mammogram in the inframammary portion of the breast.
IMPRESSION: 1.      No focal mammographic ultrasound abnormality is noted in the region
of questionable palpable fullness in the lower portion of the left breast.
2.     Nodular density noted in the upper portion of the breast for which
compression spot films and ultrasound if needed suggested for further
evaluation.
3.      BI-RADS:  Category 0- Needs Additional Imaging.

A negative mammogram report does not preclude biopsy or other evaluation of
a clinically palpable or otherwise suspicious mass or lesion.  Breast cancer
may not be detected by mammography in up to 10% of cases.

## 2009-01-27 ENCOUNTER — Ambulatory Visit: Payer: Self-pay

## 2009-01-27 IMAGING — US ULTRASOUND LEFT BREAST
1 series · 17 of 24 positions shown · non-contrast
Comparison: none

REASON FOR EXAM: left breast nodular density [REDACTED]
COMMENTS:

[Series 1: ultrasound left breast · 17 of 24 slices shown]
[im 1/24]
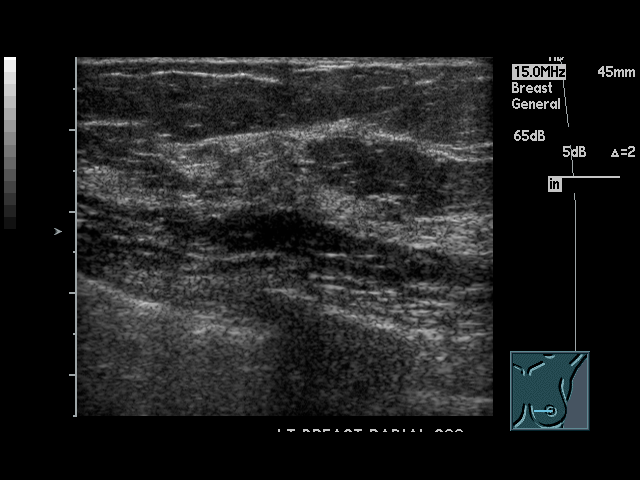
[im 3/24]
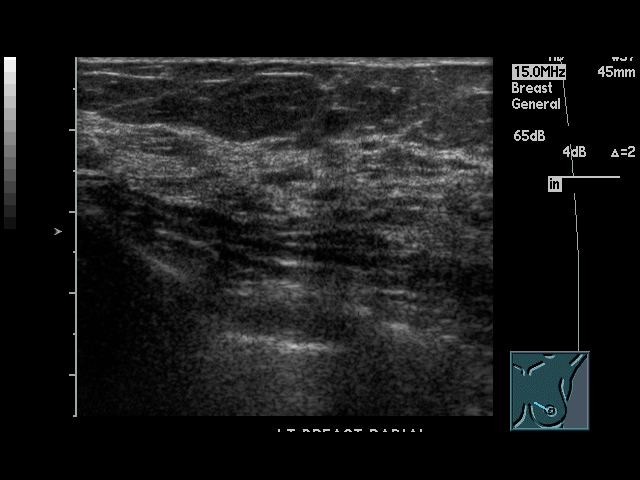
[im 4/24]
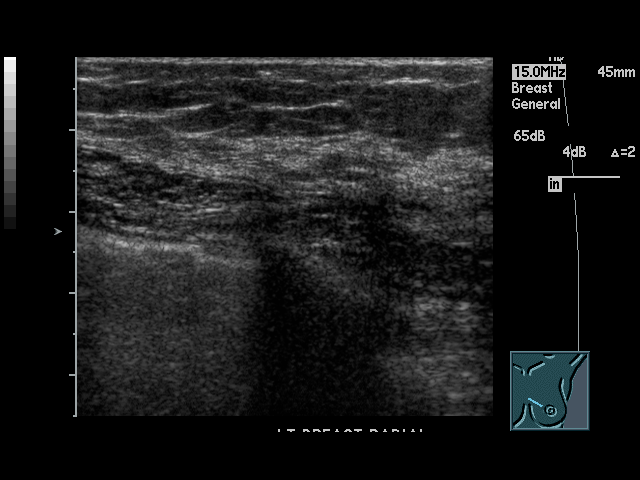
[im 5/24]
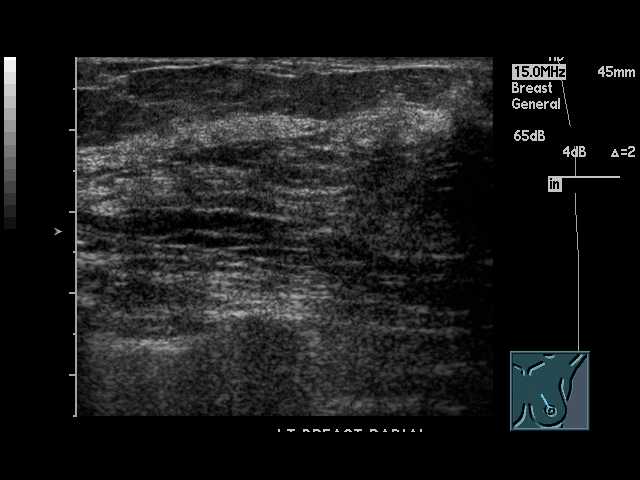
[im 7/24]
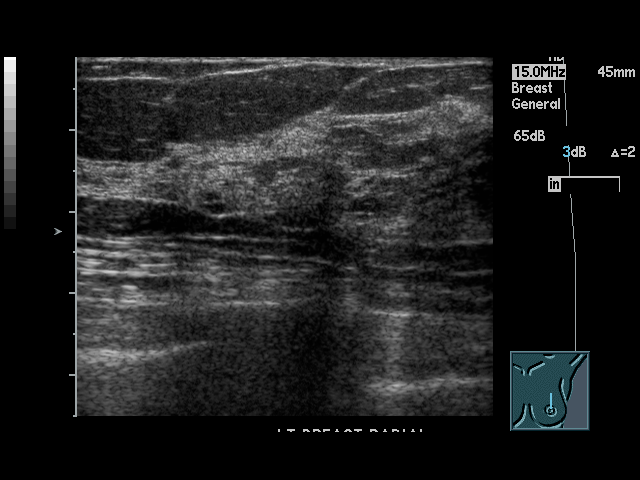
[im 8/24]
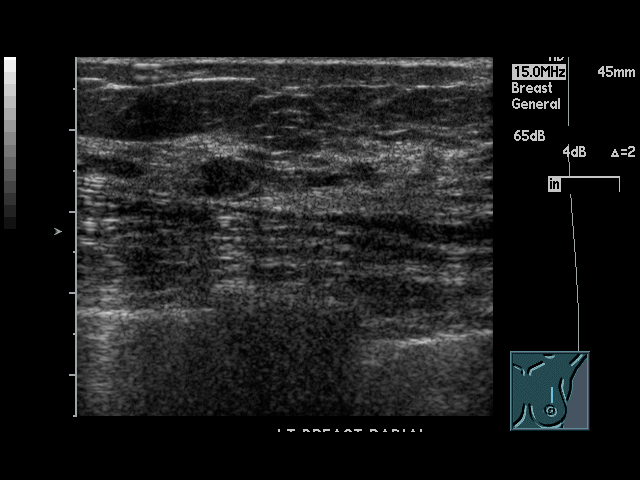
[im 10/24]
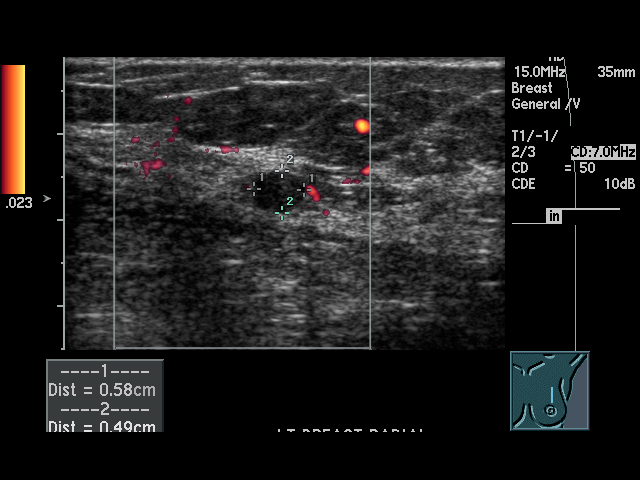
[im 11/24]
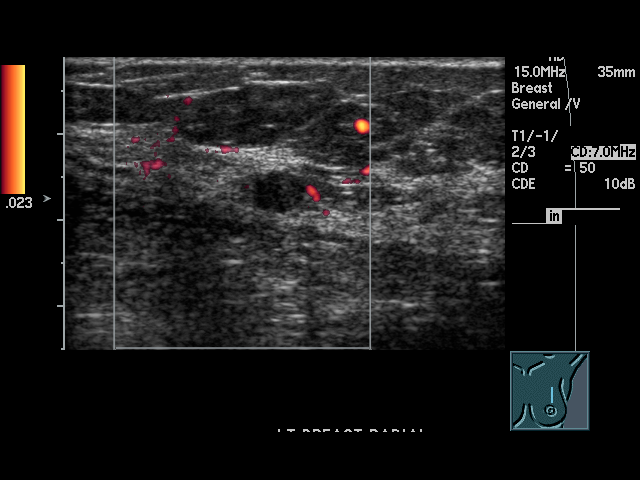
[im 13/24]
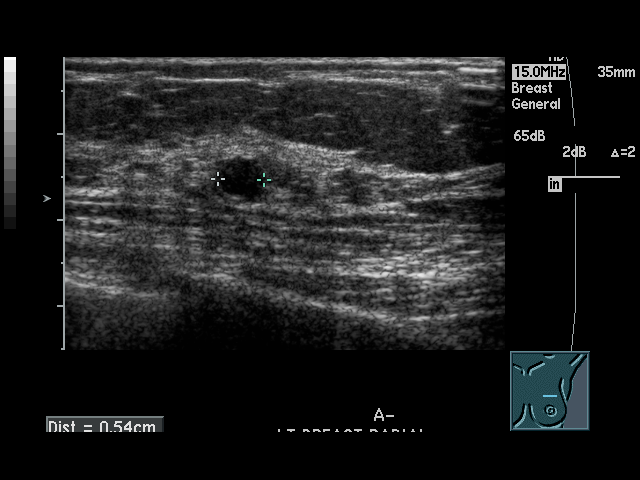
[im 14/24]
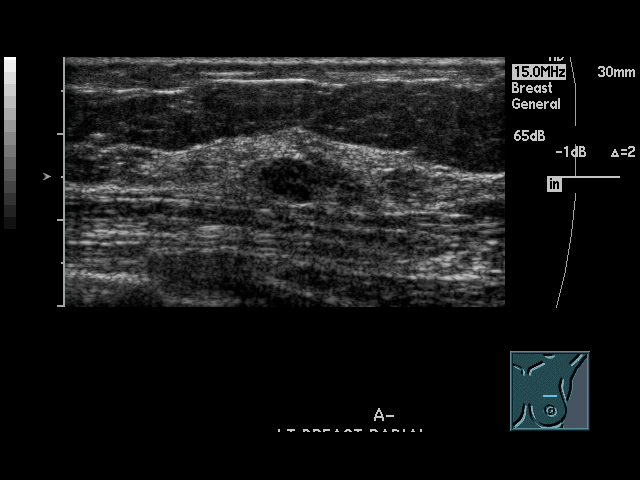
[im 15/24]
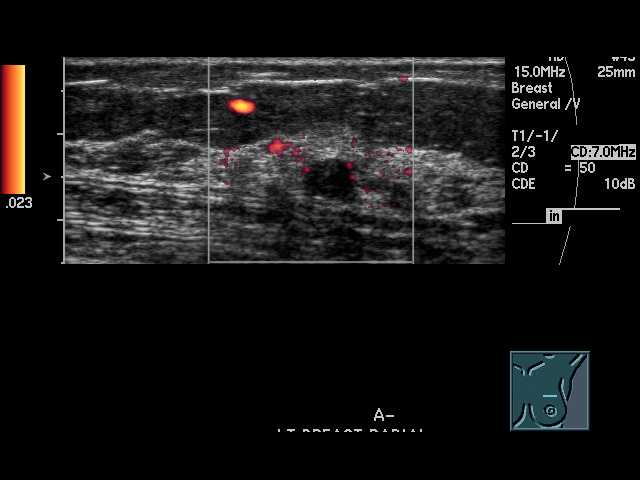
[im 17/24]
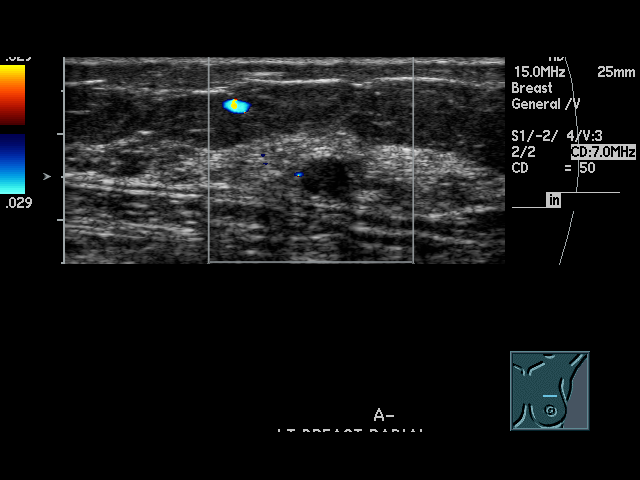
[im 18/24]
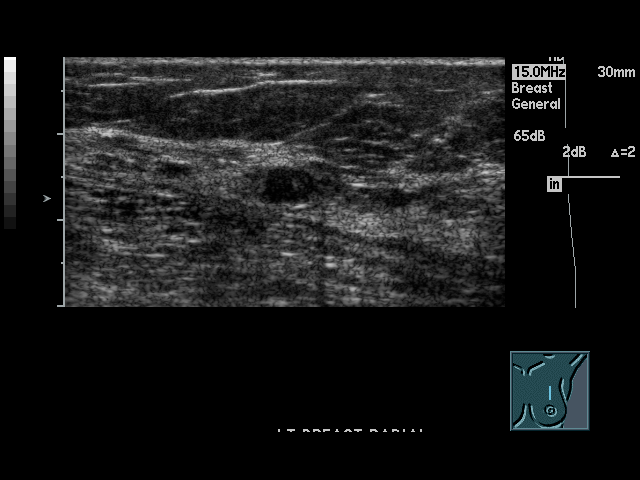
[im 20/24]
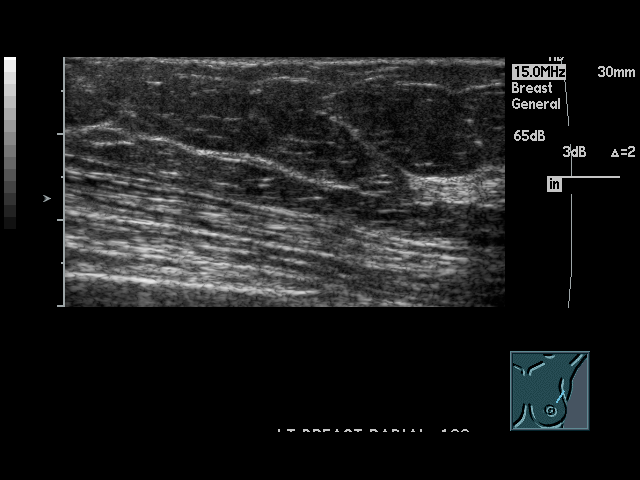
[im 21/24]
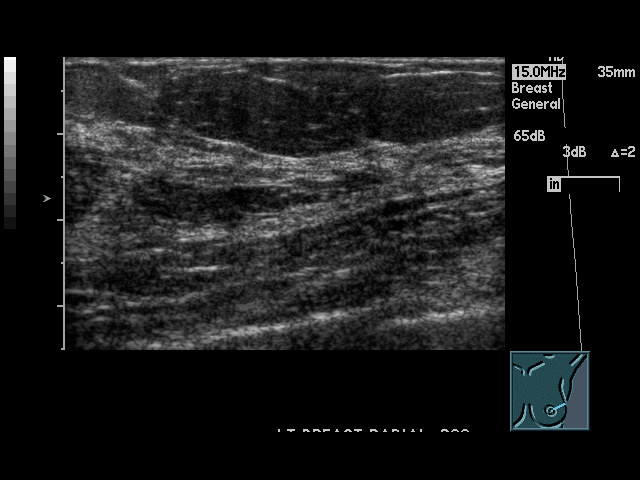
[im 22/24]
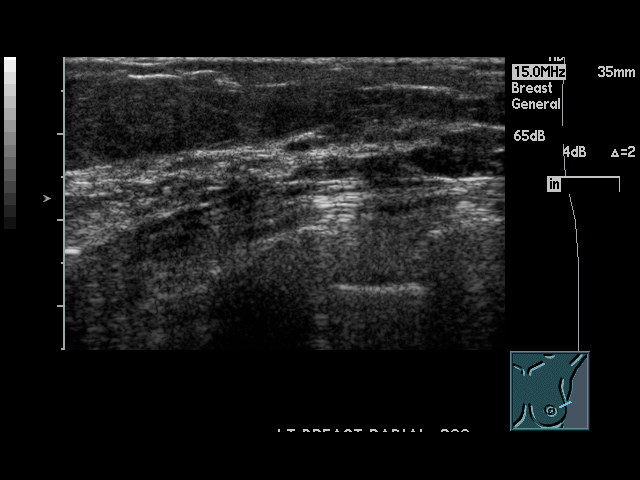
[im 24/24]
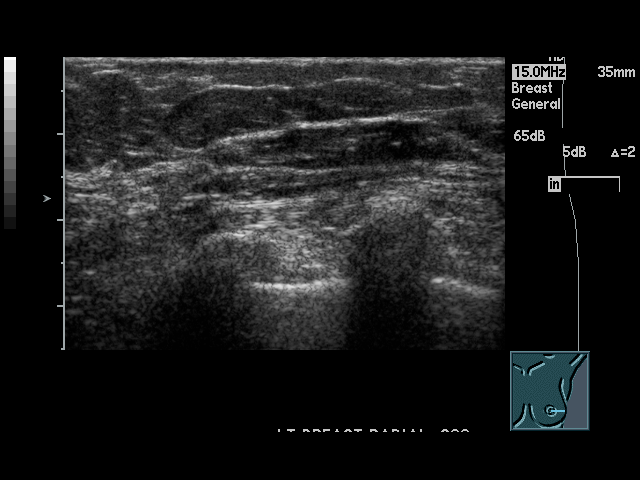

[17 of 24 positions shown; findings below may reference images not displayed]

PROCEDURE:     US  - US LT BREAST ([REDACTED])  - [DATE]  [DATE]

RESULT:

Additional views of the left breast reveal a parenchymal density at
approximately 12 o'clock in the left breast.  Ultrasound was performed and
reveals a solid lesion measuring approximately 6 mm.  Needle localization
and surgical removal of this nodule is suggested as malignancy cannot be
excluded.
IMPRESSION: 1.New solid nodule upper aspect left breast for which needle localization
and surgical removal is suggested.

BI-RADS: Category 4-Suspicious Abnormality

## 2009-02-10 ENCOUNTER — Ambulatory Visit: Payer: Self-pay | Admitting: Surgery

## 2009-02-17 ENCOUNTER — Ambulatory Visit: Payer: Self-pay | Admitting: Surgery

## 2009-02-17 HISTORY — PX: BREAST EXCISIONAL BIOPSY: SUR124

## 2009-06-07 ENCOUNTER — Emergency Department: Payer: Self-pay | Admitting: Emergency Medicine

## 2009-06-07 IMAGING — CR DG CHEST 2V
1 series · 2 of 2 positions shown · non-contrast
Comparison: none

REASON FOR EXAM: pain
COMMENTS:

[Series 1: view not recorded · 0.17mm/px · 2 of 2 slices shown]
[im 1/2]
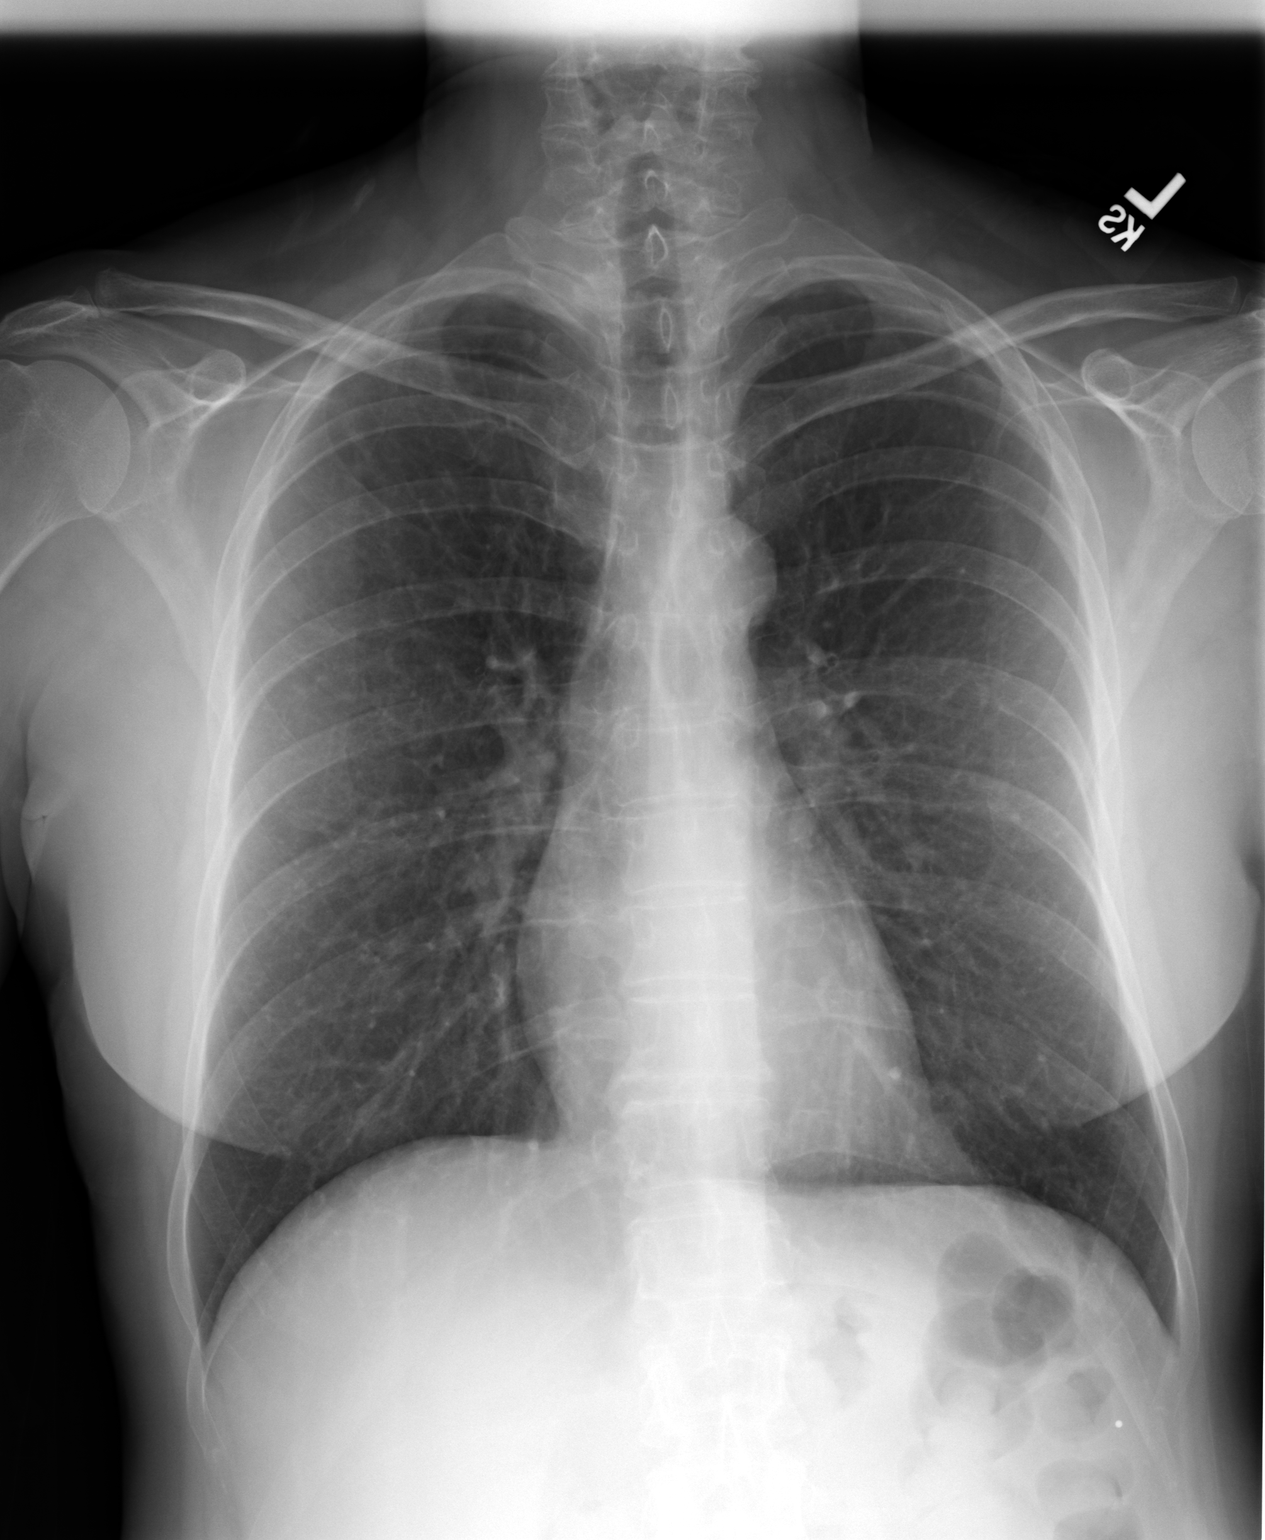
[im 2/2]
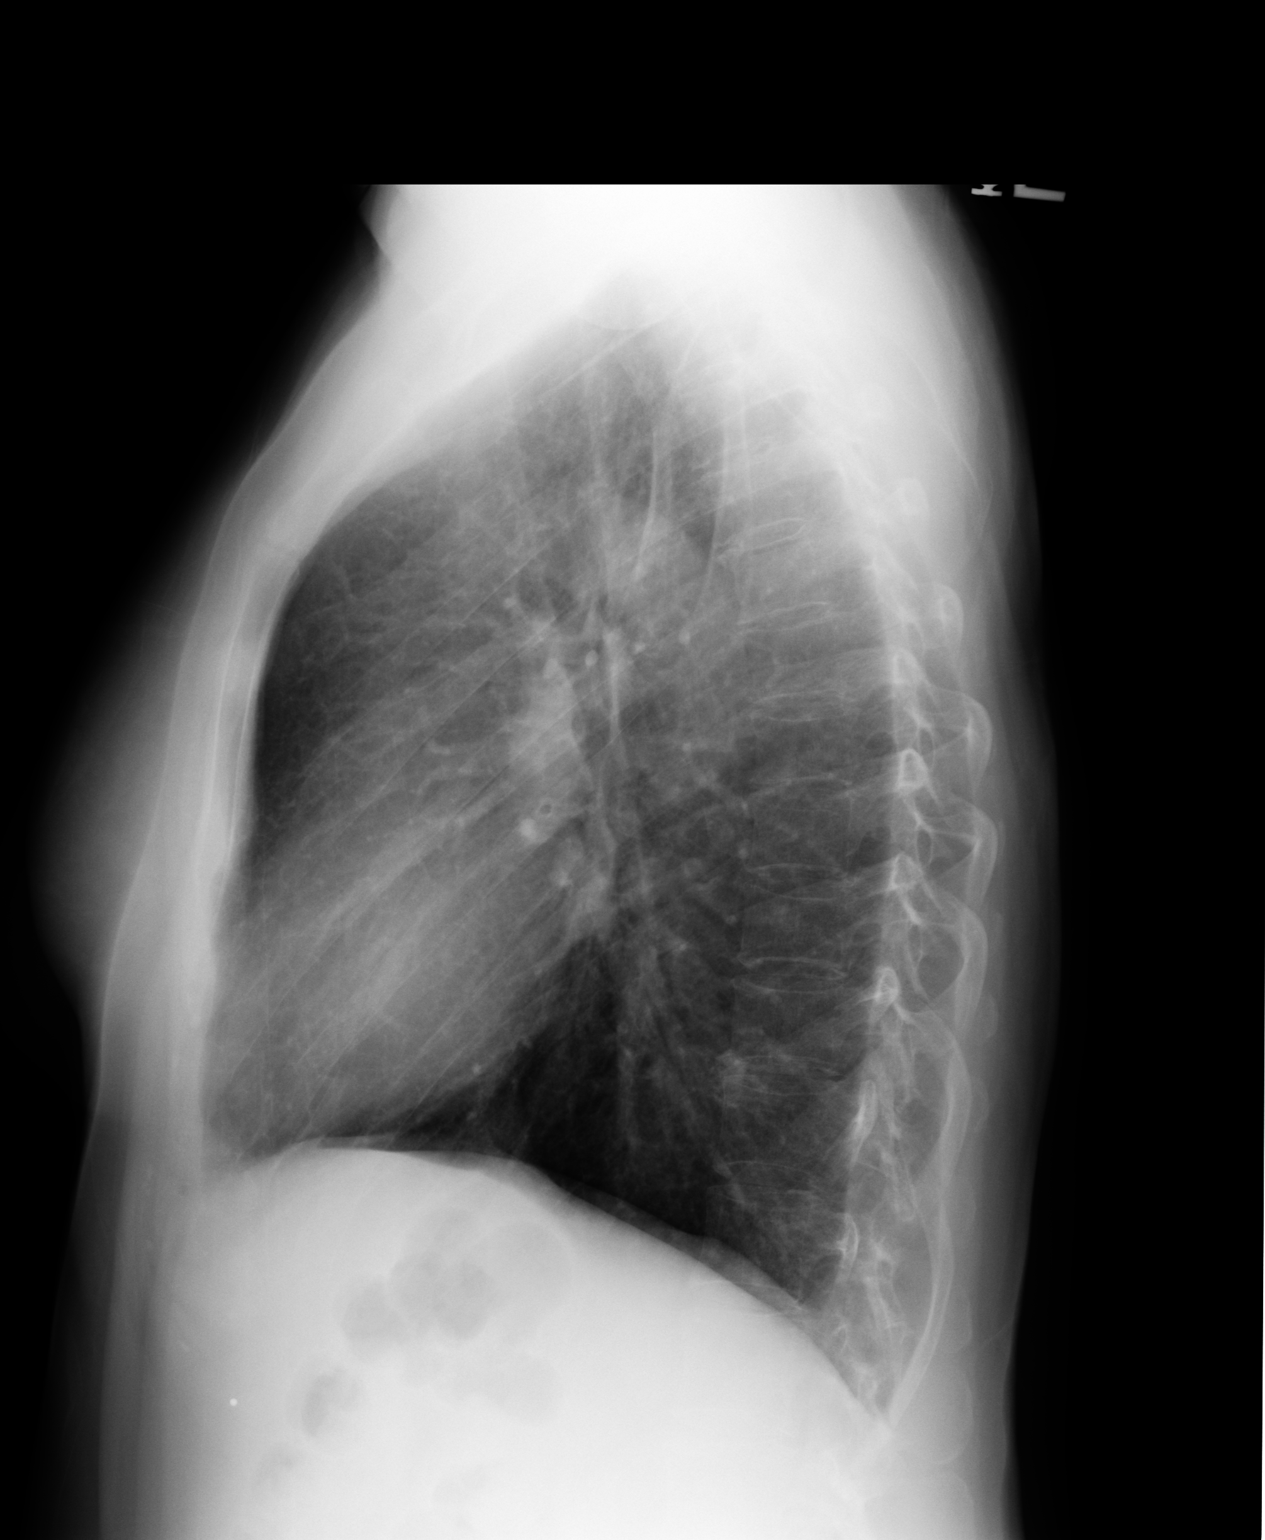

[2 of 2 positions shown; findings below may reference images not displayed]

PROCEDURE:     DXR - DXR CHEST PA (OR AP) AND LATERAL  - [DATE]  [DATE]

RESULT:     Comparison is made to the study of [DATE].

There is mild hyperinflation. The lungs are clear. The heart and pulmonary
vessels are normal. The bony and mediastinal structures are unremarkable.
There is no effusion. There is no pneumothorax or evidence of congestive
failure.
IMPRESSION: No acute cardiopulmonary disease. Mildly hyperinflated
lungs. Stable appearance.

## 2010-06-23 ENCOUNTER — Emergency Department: Payer: Self-pay | Admitting: Unknown Physician Specialty

## 2010-06-23 IMAGING — US US PELV - US TRANSVAGINAL
1 series · 17 of 25 positions shown · non-contrast
Comparison: none

REASON FOR EXAM: pain right lower pelvic area
COMMENTS:   LMP: Post-Menopausal

[Series 1: us pelv - us transvaginal · 17 of 62 slices shown]
[im 1/62]
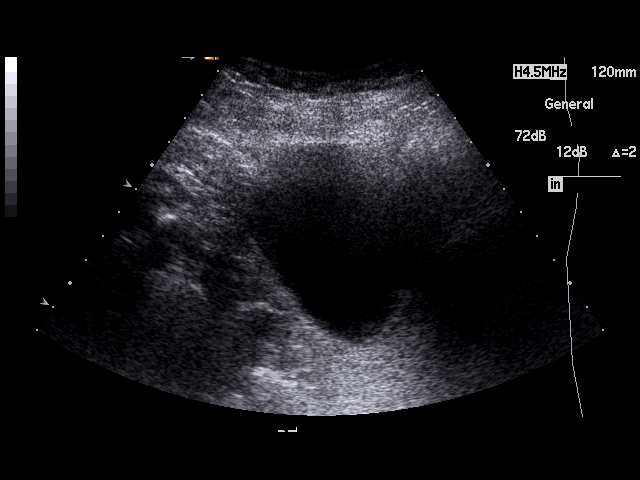
[im 6/62]
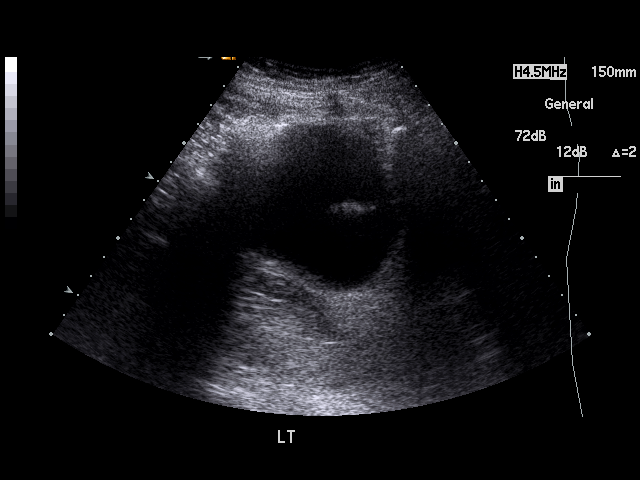
[im 8/62]
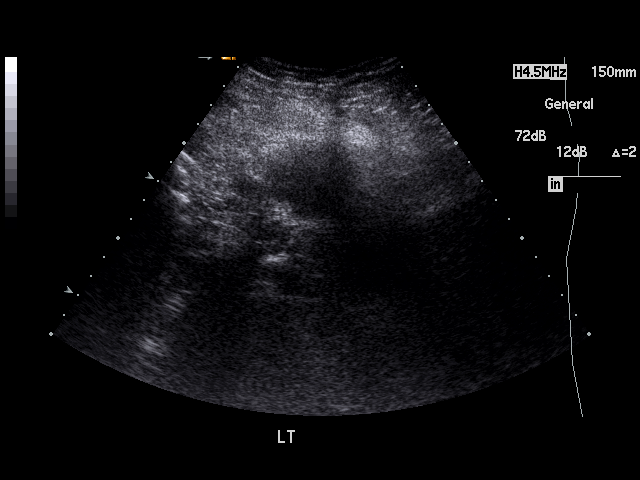
[im 13/62]
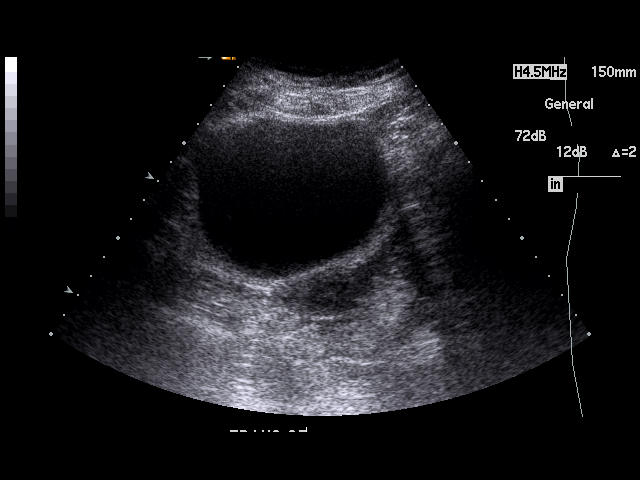
[im 16/62]
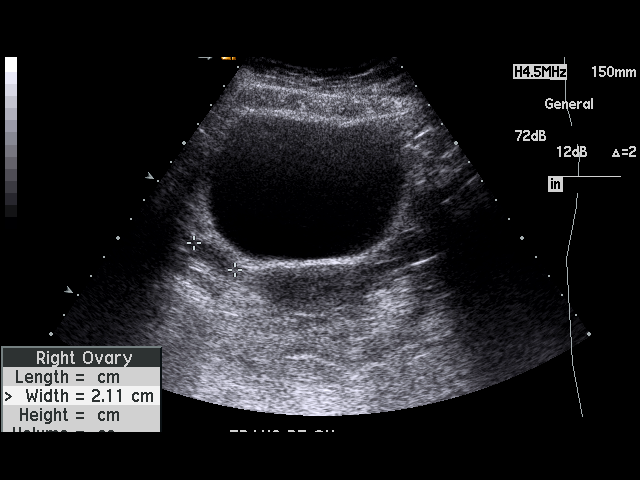
[im 21/62]
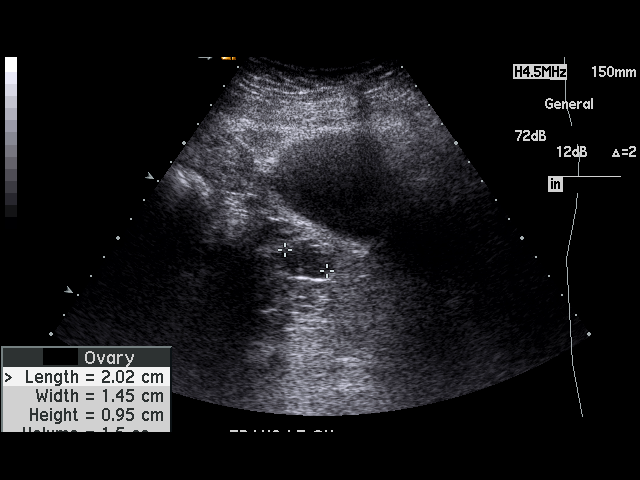
[im 23/62]
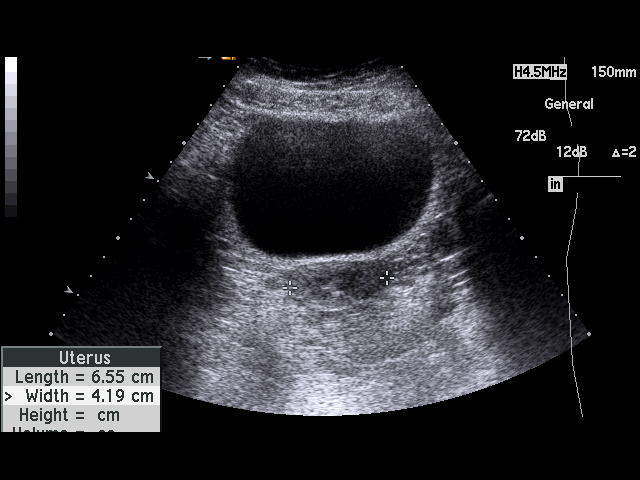
[im 28/62]
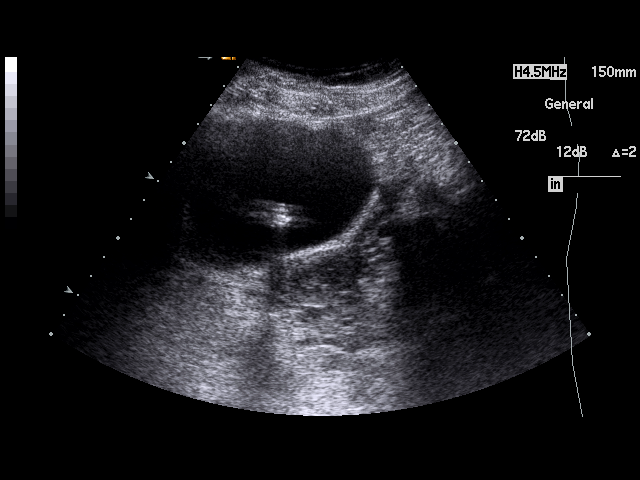
[im 31/62]
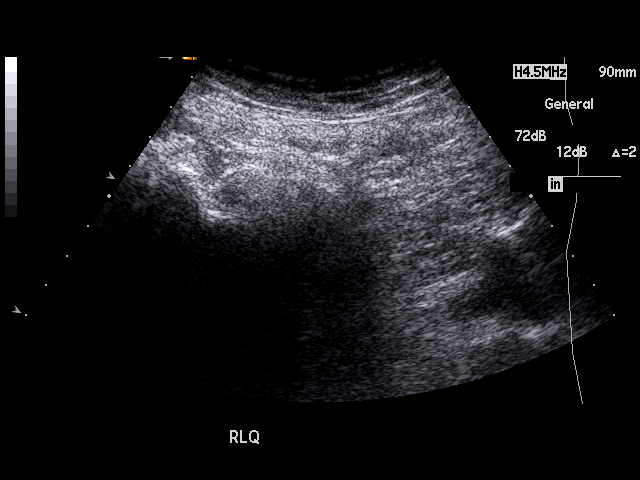
[im 34/62]
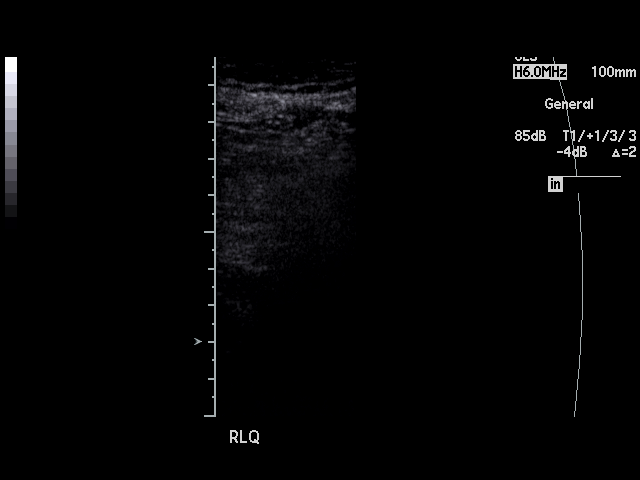
[im 39/62]
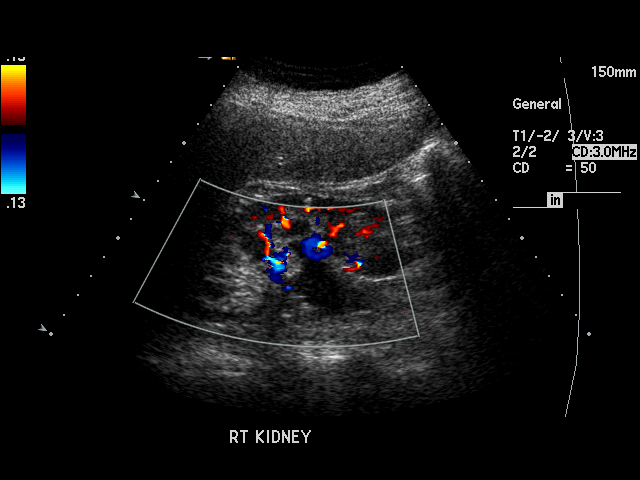
[im 41/62]
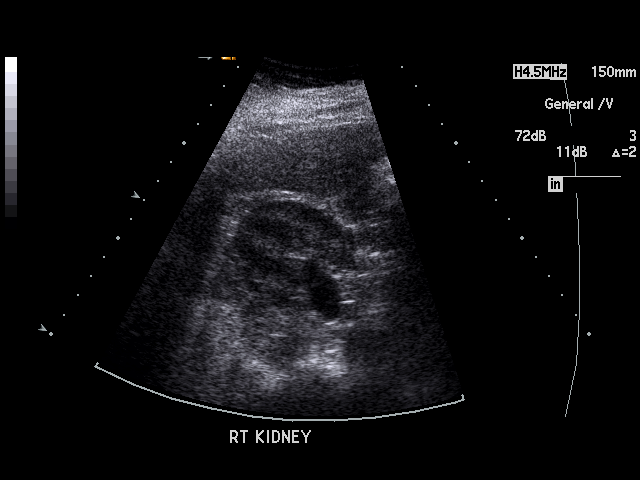
[im 46/62]
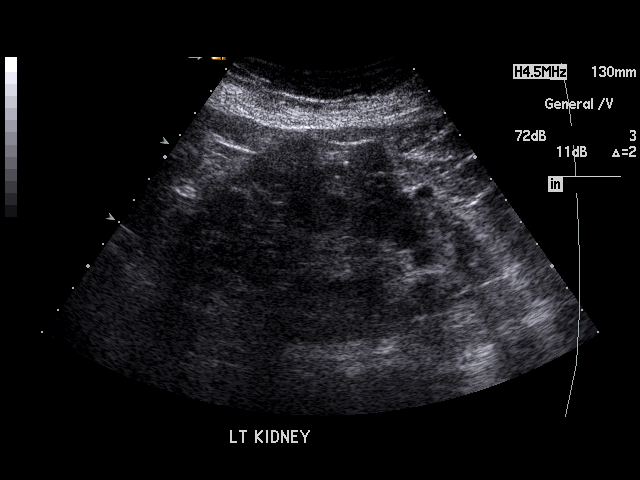
[im 49/62]
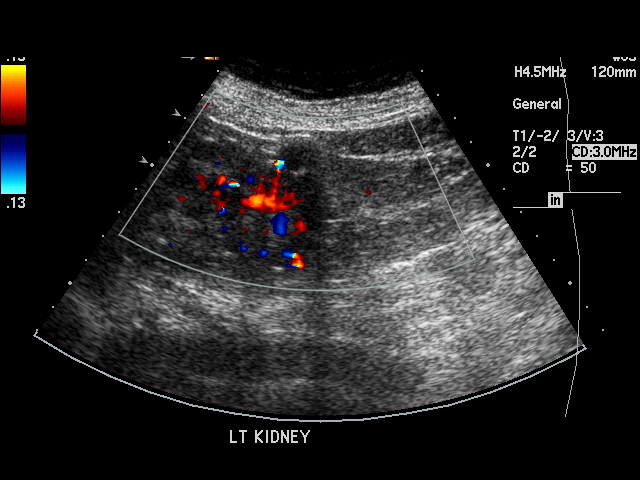
[im 54/62]
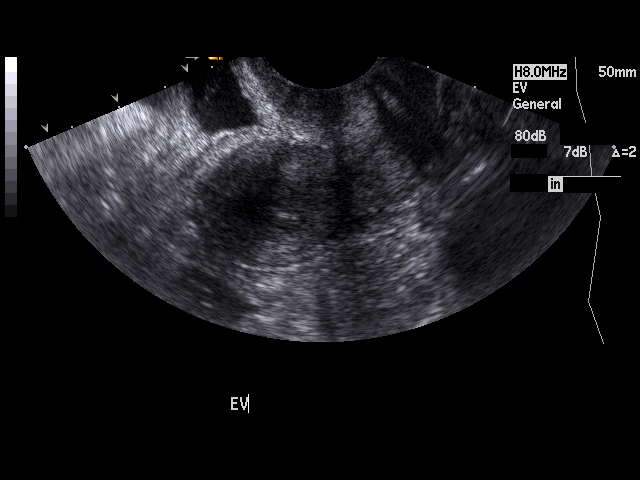
[im 56/62]
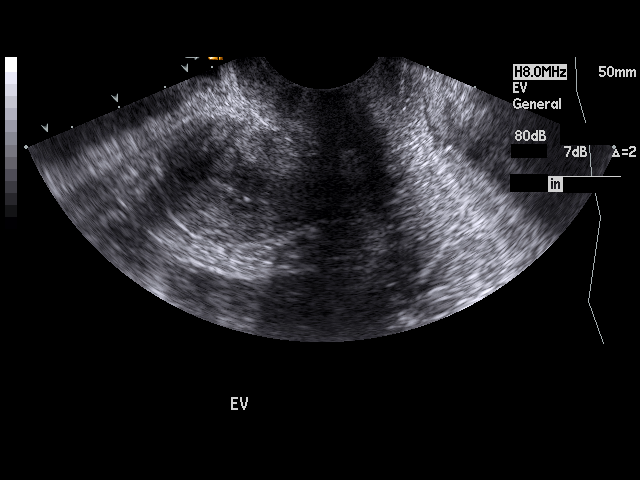
[im 62/62]
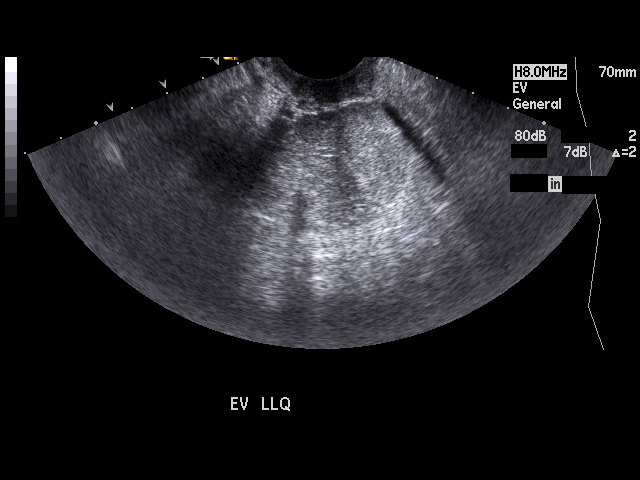

[17 of 25 positions shown; findings below may reference images not displayed]

PROCEDURE:     US  - US PELVIS EXAM W/TRANSVAGINAL  - [DATE] [DATE]

RESULT:     The uterus exhibits normal echotexture and contour. It measures
6.5 x 4.2 x 2.1 cm. The endometrial stripe is normal at just over 1 mm in
thickness. The ovaries are normal in echotexture and contour. The right
ovary measures 1.5 x 2.1 x 0.9 cm. The left ovary measures 2 x 1.4 x 1 cm.
Vascularity of the ovaries appears normal.

The left kidney exhibits an exophytic solid appearing hypodensity from its
mid pole laterally. This measures 1.5 x 1.1 x 1.4 cm. There is no evidence
of hydronephrosis on the left. On the right there is minimal hydronephrosis
versus an extrarenal pelvis. I see no perinephric fluid collections.
IMPRESSION: 1. The uterus and ovaries are normal in appearance.
2. There is a solid appearing mass exophytic from the midpole of the left
kidney. A triphasic renal CT scan is recommended.
3. There is minimal hydronephrosis versus an extrarenal pelvis on the right.
This too could be evaluated further with the aforementioned renal CT scan.

A preliminary report was called to the [HOSPITAL] the
conclusion of the study

## 2010-06-23 IMAGING — CT CT ABD-PELV W/ CM
1 of 3 series · 13 of 32 positions shown, 19 images · non-contrast
Comparison: none

REASON FOR EXAM: (1) rlq, delays to eval. kidney mass on left; (2) same
COMMENTS:

PROCEDURE:     CT  - CT ABDOMEN / PELVIS  W  - [DATE]  [DATE]
RESULT:     CT abdomen and pelvis dated [DATE]
TECHNIQUE: Helical 3 mm sections were obtained from the lung bases through
the pubic symphysis status post intravenous administration of 85 mL
[M5] and oral contrast.

[Series 2: with · axial · 0.66mm/px · z∈[-458,-98]mm · 13 of 141 slices shown, 19 images]
[im 11/141  soft-tissue]
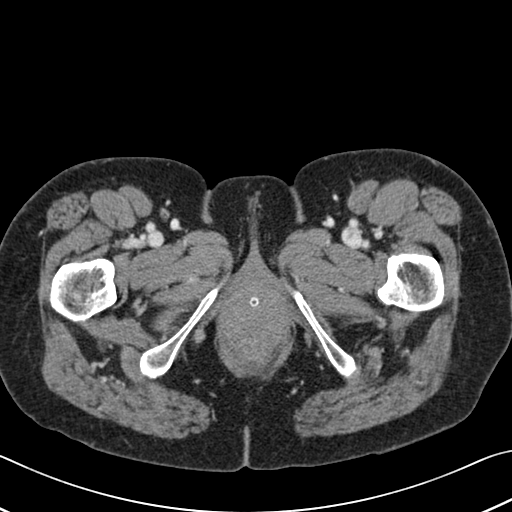
[im 11/141  bone]
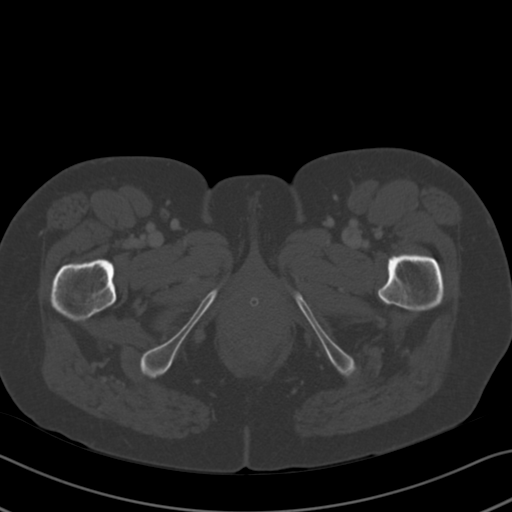
[im 21/141  soft-tissue]
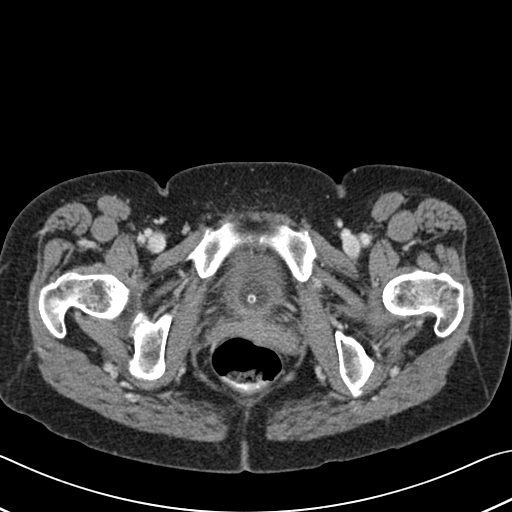
[im 31/141  soft-tissue]
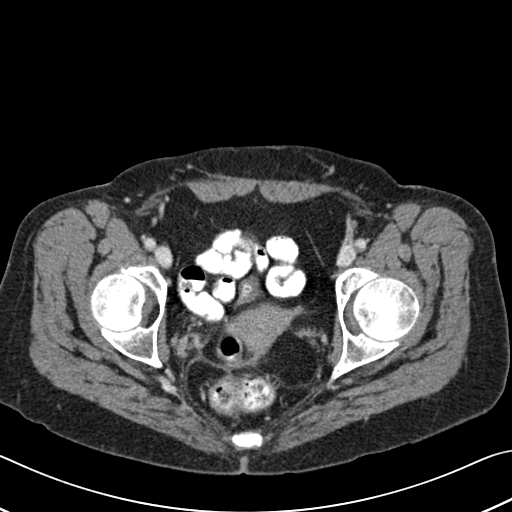
[im 41/141  soft-tissue]
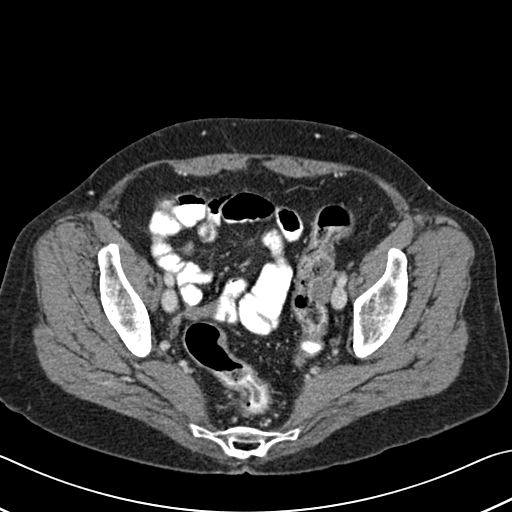
[im 51/141  soft-tissue]
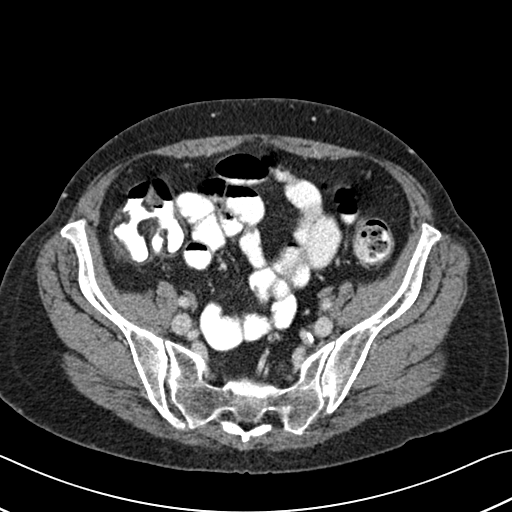
[im 61/141  soft-tissue]
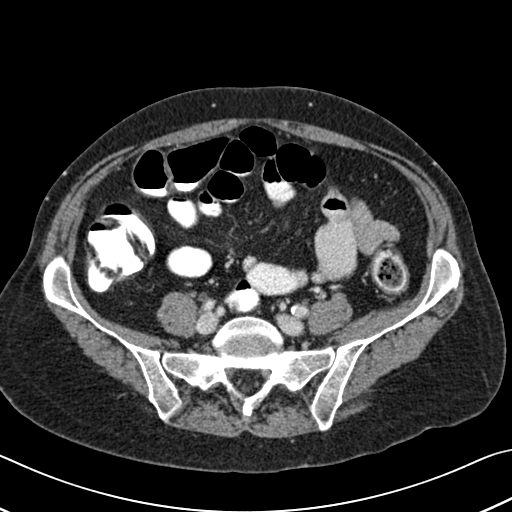
[im 71/141  soft-tissue]
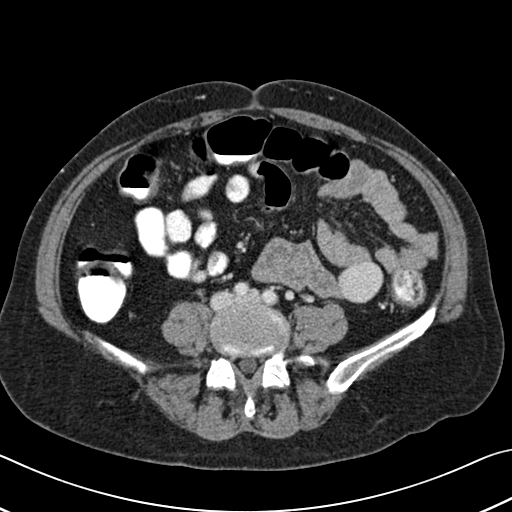
[im 81/141  soft-tissue]
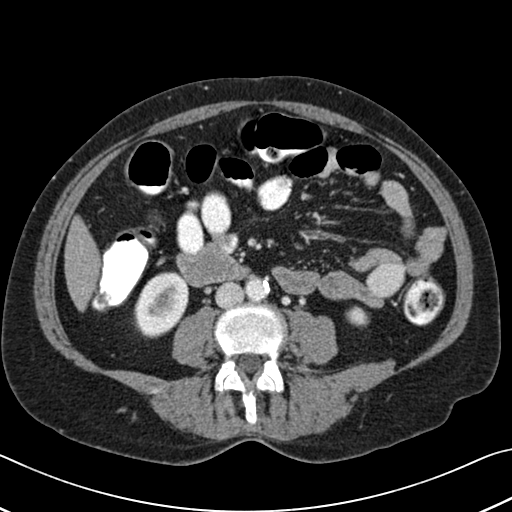
[im 91/141  soft-tissue]
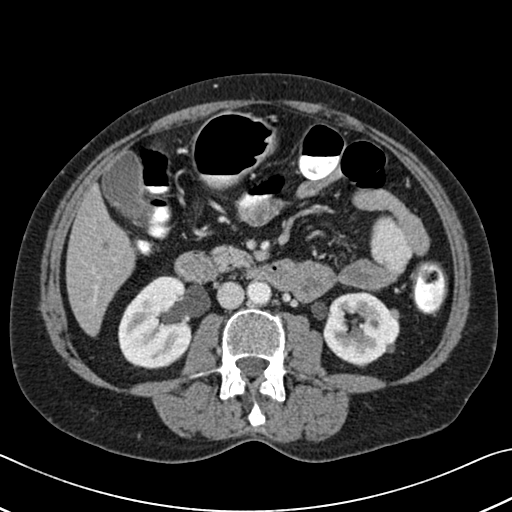
[im 91/141  bone]
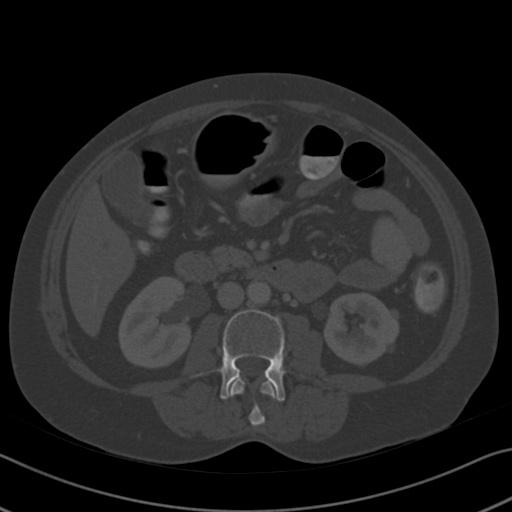
[im 101/141  soft-tissue]
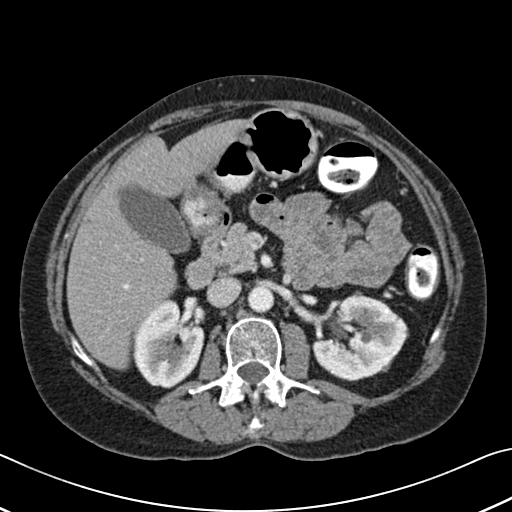
[im 101/141  lung]
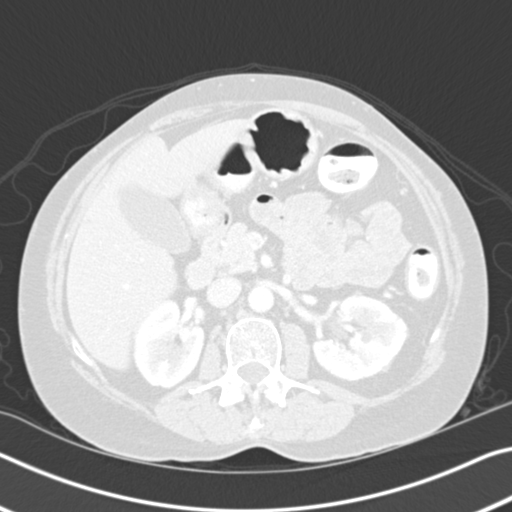
[im 111/141  soft-tissue]
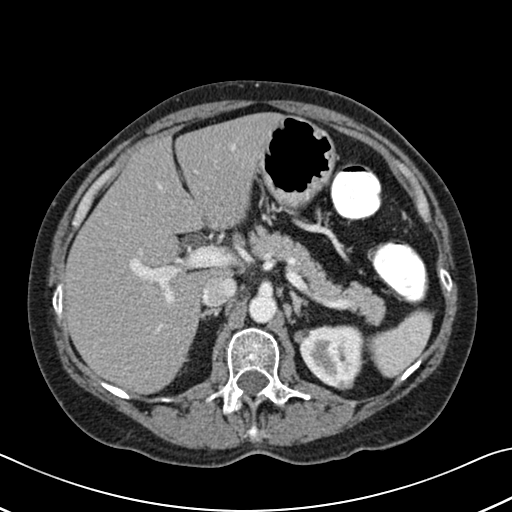
[im 111/141  lung]
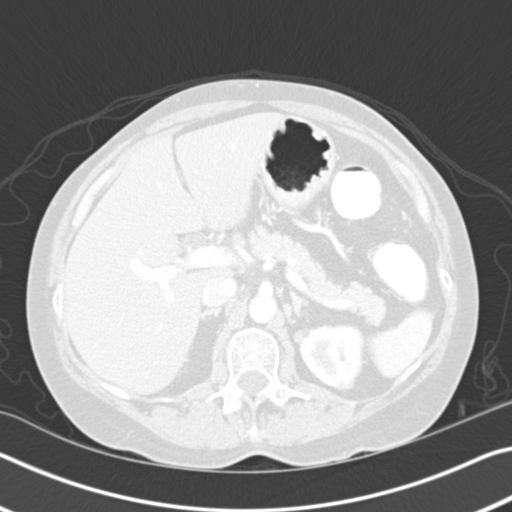
[im 121/141  soft-tissue]
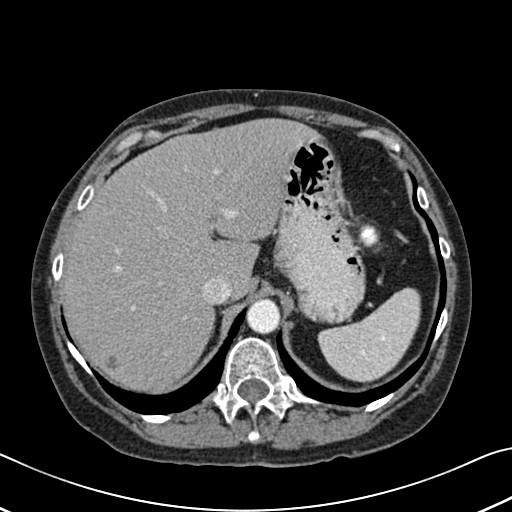
[im 121/141  lung]
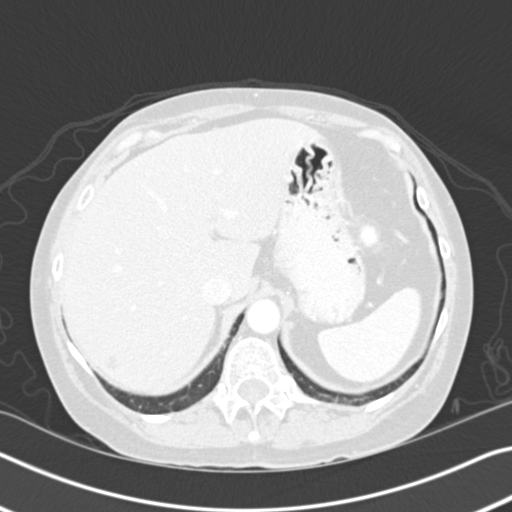
[im 131/141  soft-tissue]
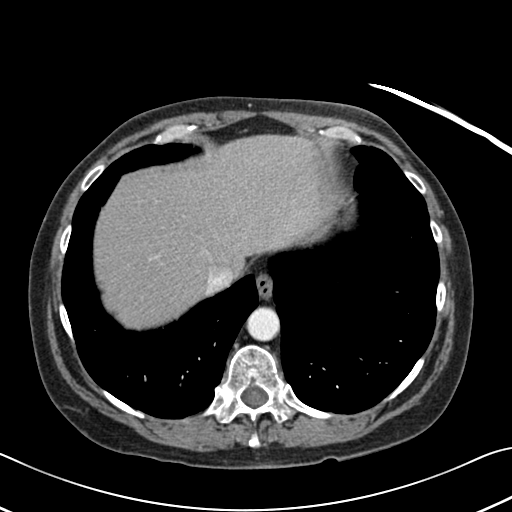
[im 131/141  lung]
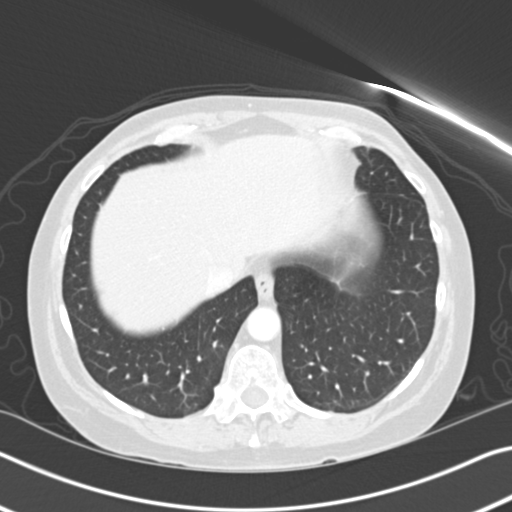

[13 of 32 positions shown; findings below may reference images not displayed]

FINDINGS: Evaluation of the lung bases insure its minimal hypoventilation. A
multiple small low attenuating nonenhancing foci are appreciated scattered
throughout the liver. Different considerations are multiple small cyst
versus multiple atypical hemangiomas. The liver is otherwise unremarkable.
The spleen, adrenals, and pancreas are unremarkable.

Evaluation left kidney demonstrates small nonenhancing low attenuating foci
scattered the kidney indicative of multiple cysts. There is no evidence of
hydronephrosis.

The right kidney demonstrates a moderate to high attenuating exophytic
nodule along the posterior midpole region measuring 1.4 cm in diameter.
Different considerations on an enhancing nodule versus a hyperdense cyst.
Triphasic evaluation is recommended for further characterization and or MRI
with contrast. A second areas identified along the upper pole region
posteriorly of the left kidney with similar differential considerations
measuring 1.2 cm in diameter. Small subcentimeter low attenuating foci are
scattered throughout the kidney liker attending multiple small cysts. There
is no evidence of abdominal or pelvic masses, free fluid, nor loculated
fluid collections. There is no CT evidence of bowel obstruction no secondary
signs reflecting enteritis, colitis, diverticulitis nor appendicitis. There
is no evidence of abdominal aortic aneurysm.
IMPRESSION: Indeterminate nodules within the left kidney further
evaluation with triphasic CT and/or pre- and postcontrast MRI is recommended.
2. Otherwise no evidence of obstructive or inflammatory abnormalities.

## 2010-07-22 ENCOUNTER — Ambulatory Visit: Payer: Self-pay | Admitting: Urology

## 2010-07-22 IMAGING — CT CT ABDOMEN WO/W CM
2 of 4 series · 14 of 32 positions shown, 19 images · non-contrast
Comparison: none

REASON FOR EXAM: LT renal mass seen on CT
COMMENTS:

[Series 3: soft tissue with · axial · 0.68mm/px · z∈[-308,-98]mm · 8 of 56 slices shown, 13 images]
[im 7/56  soft-tissue]
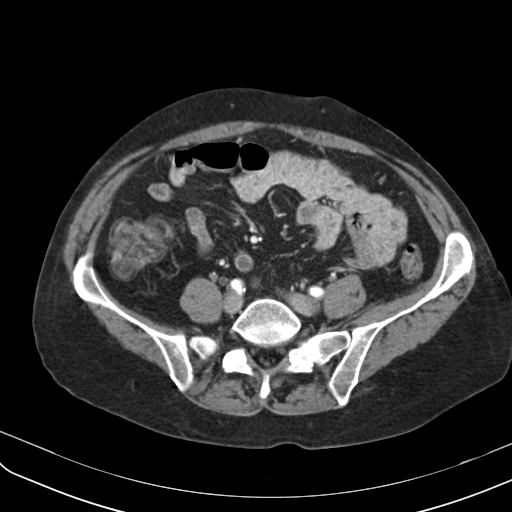
[im 7/56  bone]
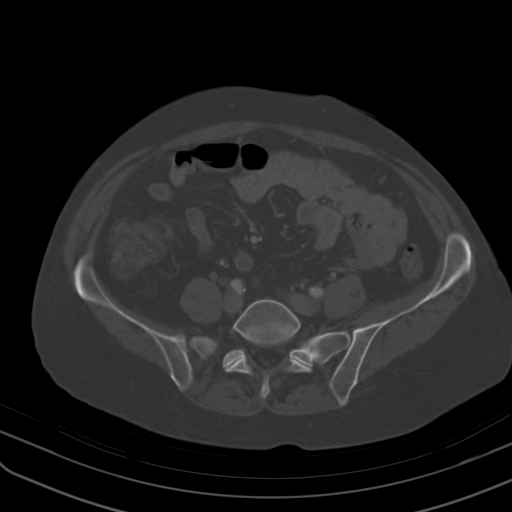
[im 13/56  soft-tissue]
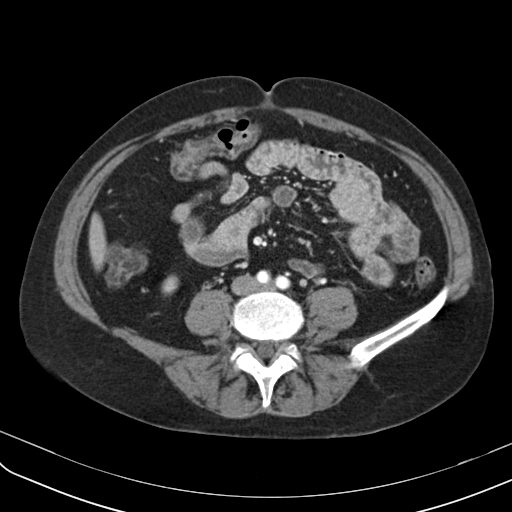
[im 19/56  soft-tissue]
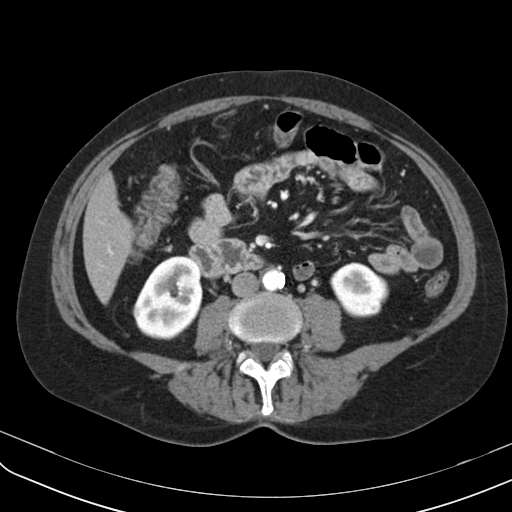
[im 25/56  soft-tissue]
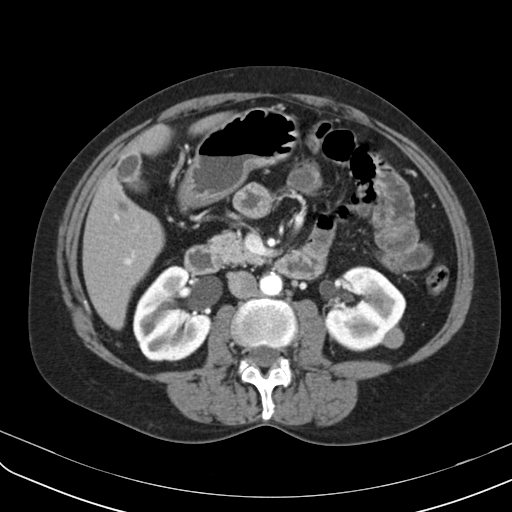
[im 31/56  soft-tissue]
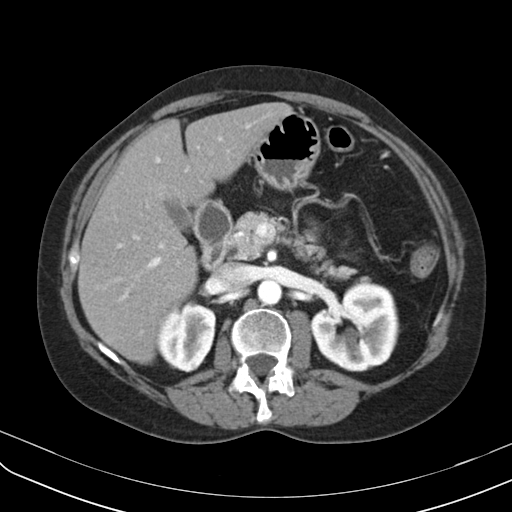
[im 31/56  lung]
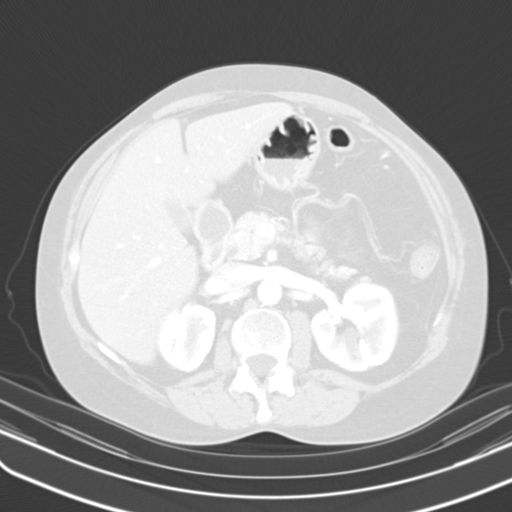
[im 37/56  soft-tissue]
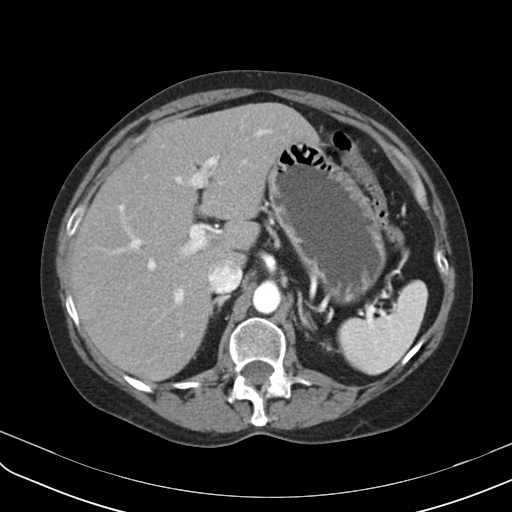
[im 37/56  lung]
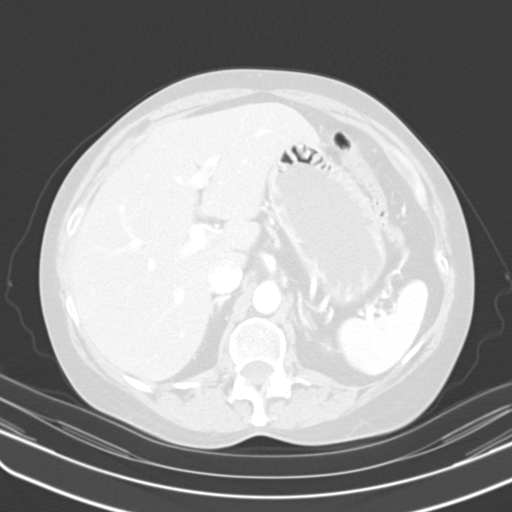
[im 43/56  soft-tissue]
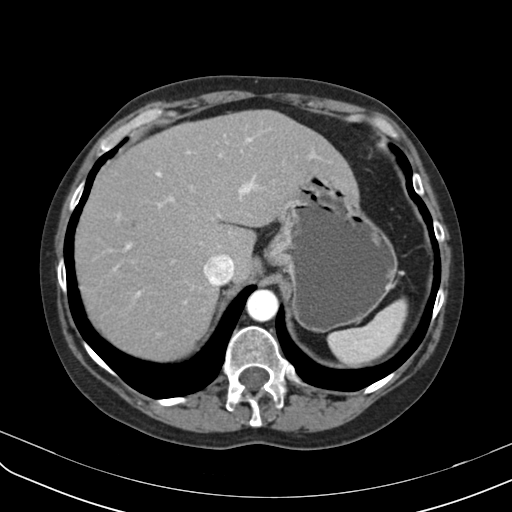
[im 43/56  lung]
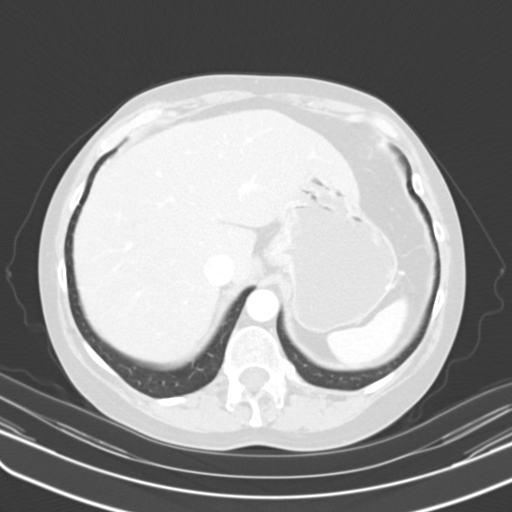
[im 49/56  soft-tissue]
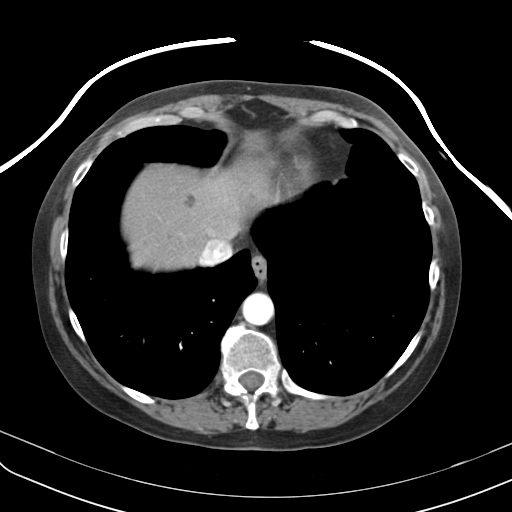
[im 49/56  lung]
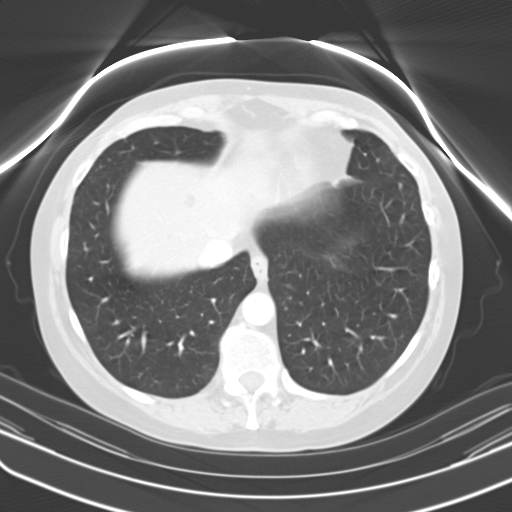

[Series 4: soft tissue delay · axial · delayed · 0.68mm/px · z∈[-308,-158]mm · 6 of 56 slices shown]
[im 7/56  soft-tissue]
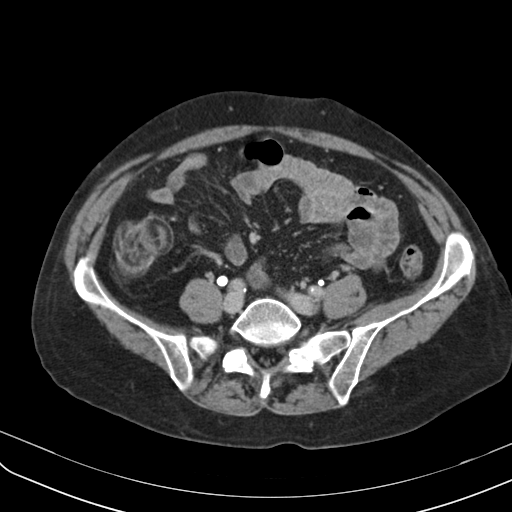
[im 13/56  soft-tissue]
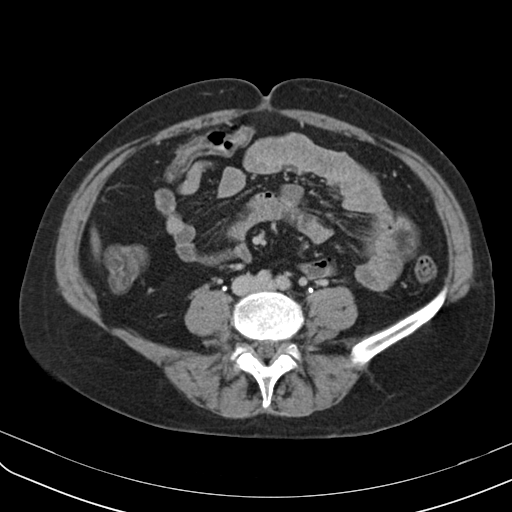
[im 19/56  soft-tissue]
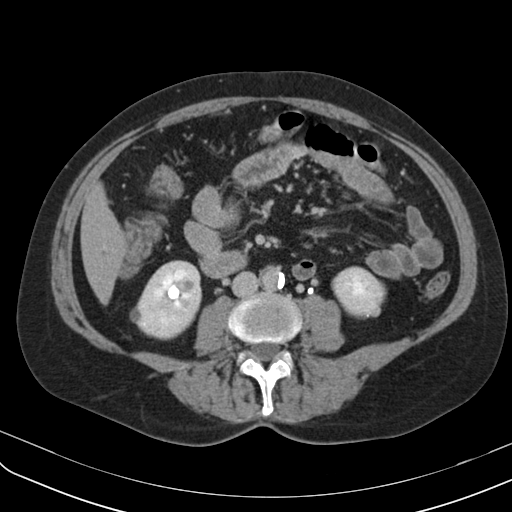
[im 25/56  soft-tissue]
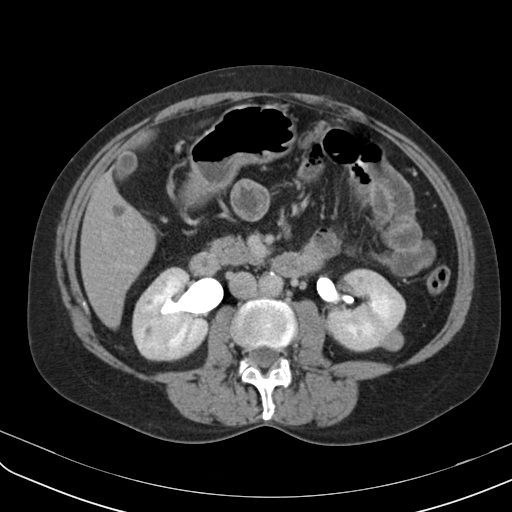
[im 31/56  soft-tissue]
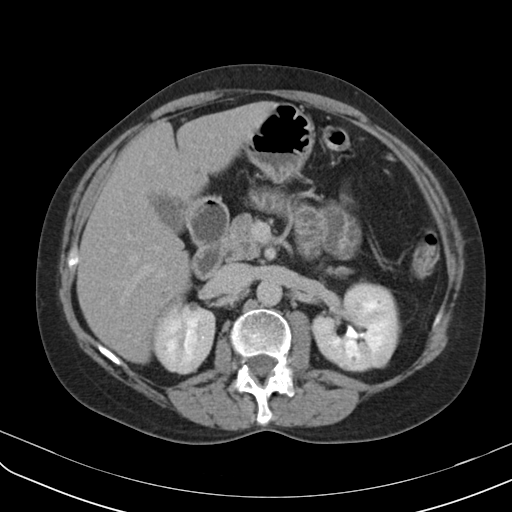
[im 37/56  soft-tissue]
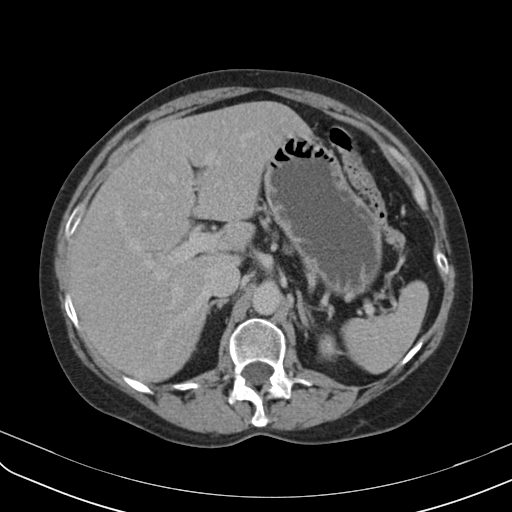

[14 of 32 positions shown; findings below may reference images not displayed]

PROCEDURE:     HADD - HADD ABDOMEN STANDARD W/WO  - [DATE] [DATE]

RESULT:     Triphasic CT of the abdomen is performed. Comparison is made to
previous abdominal images dated [DATE].

Images through the base of the lungs demonstrate normal appearing aeration.
Non-gadolinium images demonstrate the presence of isodense and hyperdense
lesions within and exophytic from the kidneys. No definite calculi or
obstructive changes are evident. Low-attenuation areas are present within
the lower portion of the right lobe of the liver. Some minimal
atherosclerotic calcification is noted in the aorta distally. Representative
Hounsfield reading in the largest exophytic mid left renal posterior area
hyperdense lesion is 84.0.

Following contrast administration that lesion demonstrates a Hounsfield
reading of 83 with delayed postcontrast images showing a Hounsfield reading
of 90. Just inferior to this lesion there is a slightly larger area with
similar Hounsfield readings of 64 precontrast, 63 immediate postcontrast and
60 on the delayed postcontrast images. These appear to suggest hyperdense
cysts. Other lesions are too small for accurate assessment. There do appear
to be some small nonenhancing cysts in both kidneys. Tiny scattered cysts
are seen throughout the liver most prominently seen in the lower portion of
the right lobe of the liver. The aorta enhances normally without dissection.
The pancreas, spleen, gallbladder and bowel appear unremarkable. The adrenal
glands appear within normal limits.
IMPRESSION: Hyperdense nonenhancing lesions in the left kidney
suggestive of hyperdense cysts. Some lesions are too small for accurate
assessment. Multiple tiny hepatic cysts are present. Minimal atherosclerotic
disease is present.

## 2011-04-08 ENCOUNTER — Emergency Department: Payer: Self-pay | Admitting: Emergency Medicine

## 2011-04-10 ENCOUNTER — Emergency Department: Payer: Self-pay | Admitting: Unknown Physician Specialty

## 2012-05-06 ENCOUNTER — Emergency Department: Payer: Self-pay | Admitting: Unknown Physician Specialty

## 2012-05-06 IMAGING — CR CERVICAL SPINE - 2-3 VIEW
1 series · 4 of 4 positions shown · non-contrast
Comparison: None

REASON FOR EXAM: pain
COMMENTS:

PROCEDURE:     DXR - DXR C- SPINE AP AND LATERAL  - [DATE]  [DATE]
RESULT:     History: Cervical pain

[Series 1: w cervical spine lat · 0.14mm/px · 4 of 4 slices shown]
[im 1/4]
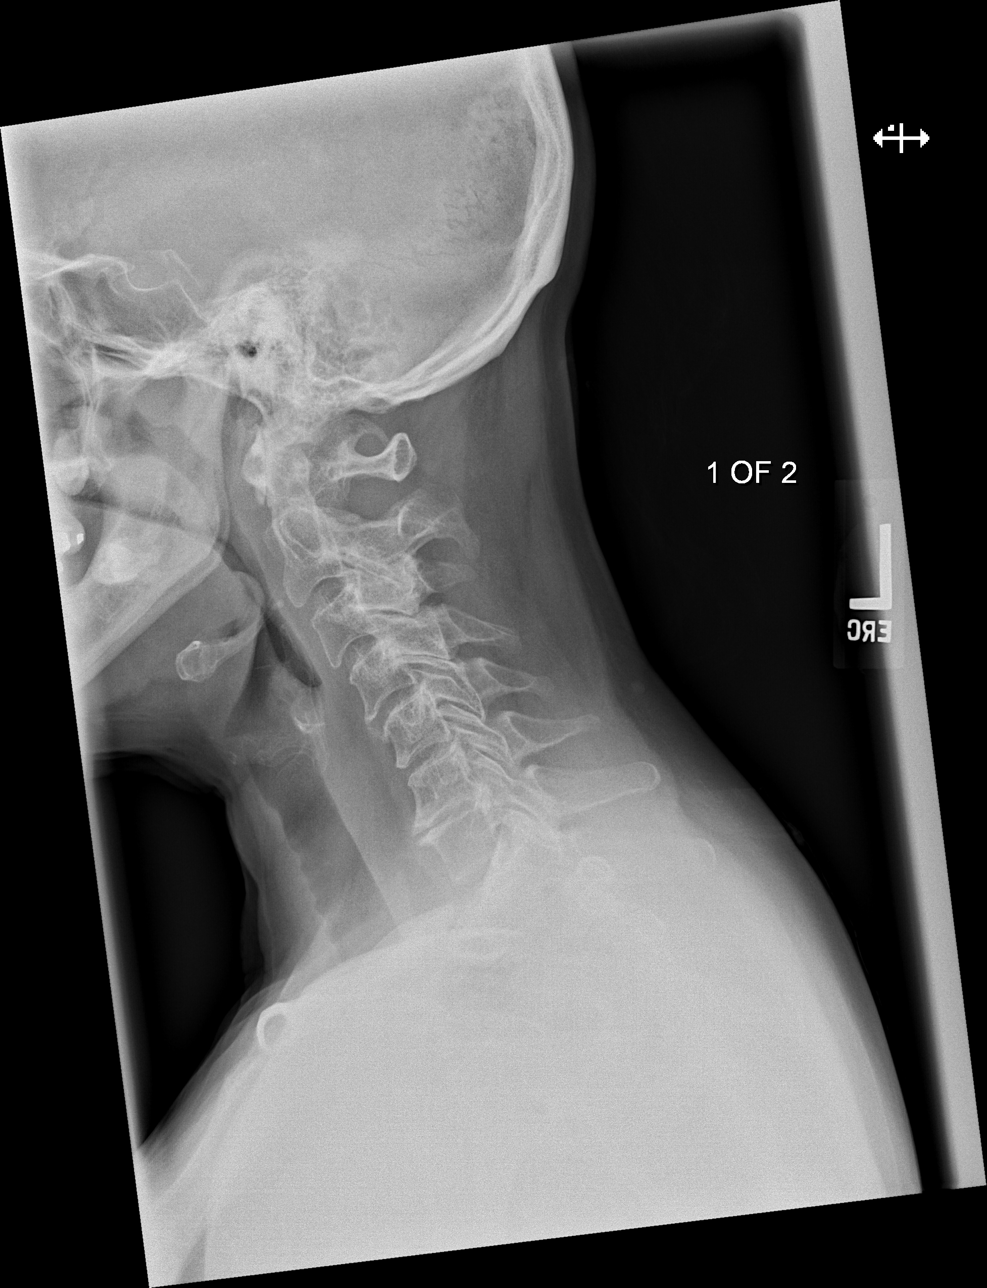
[im 2/4]
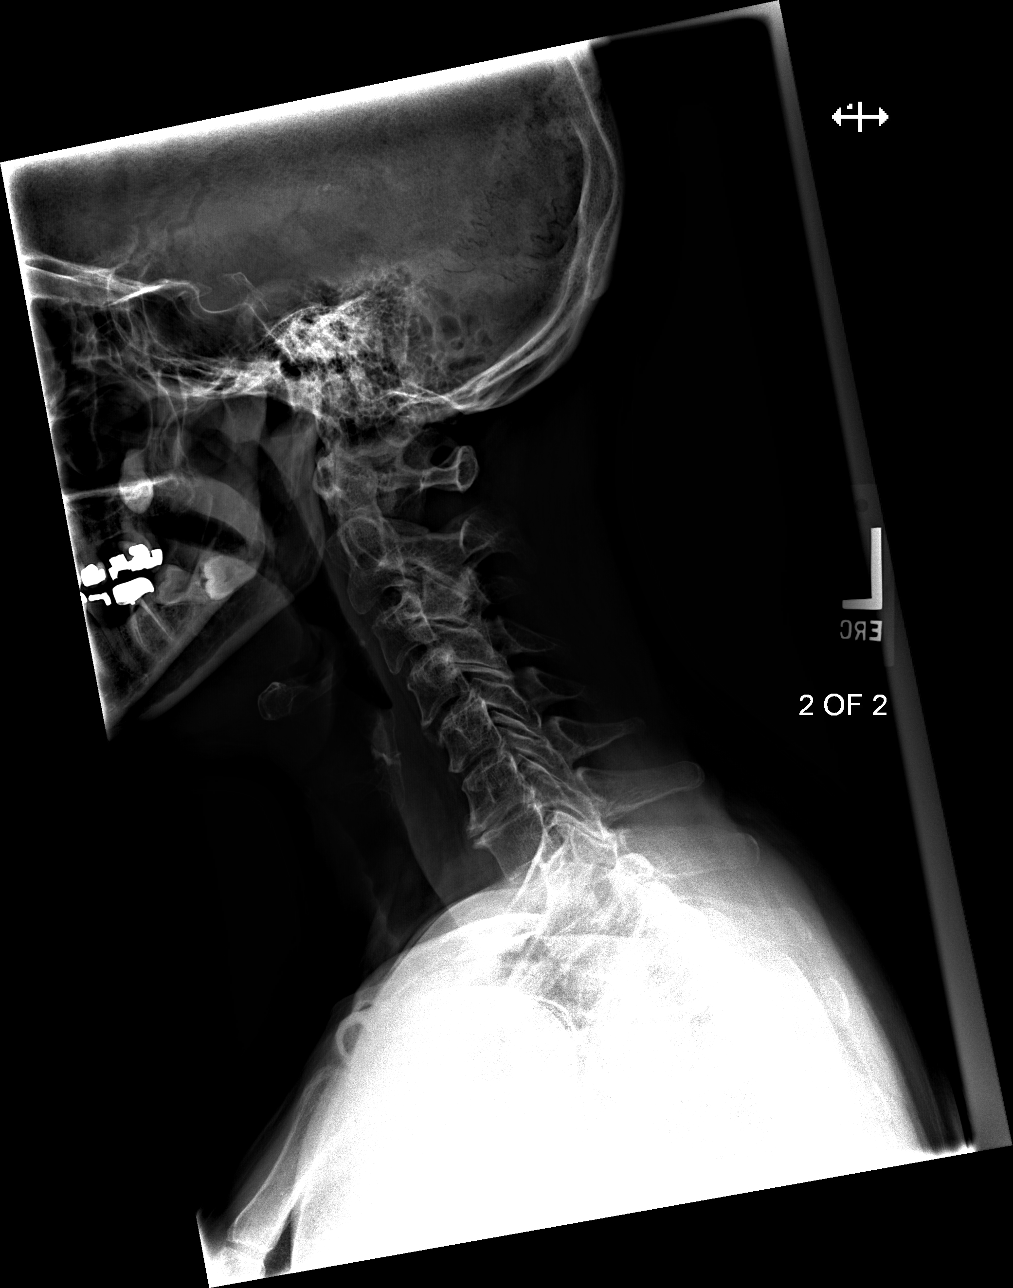
[im 3/4]
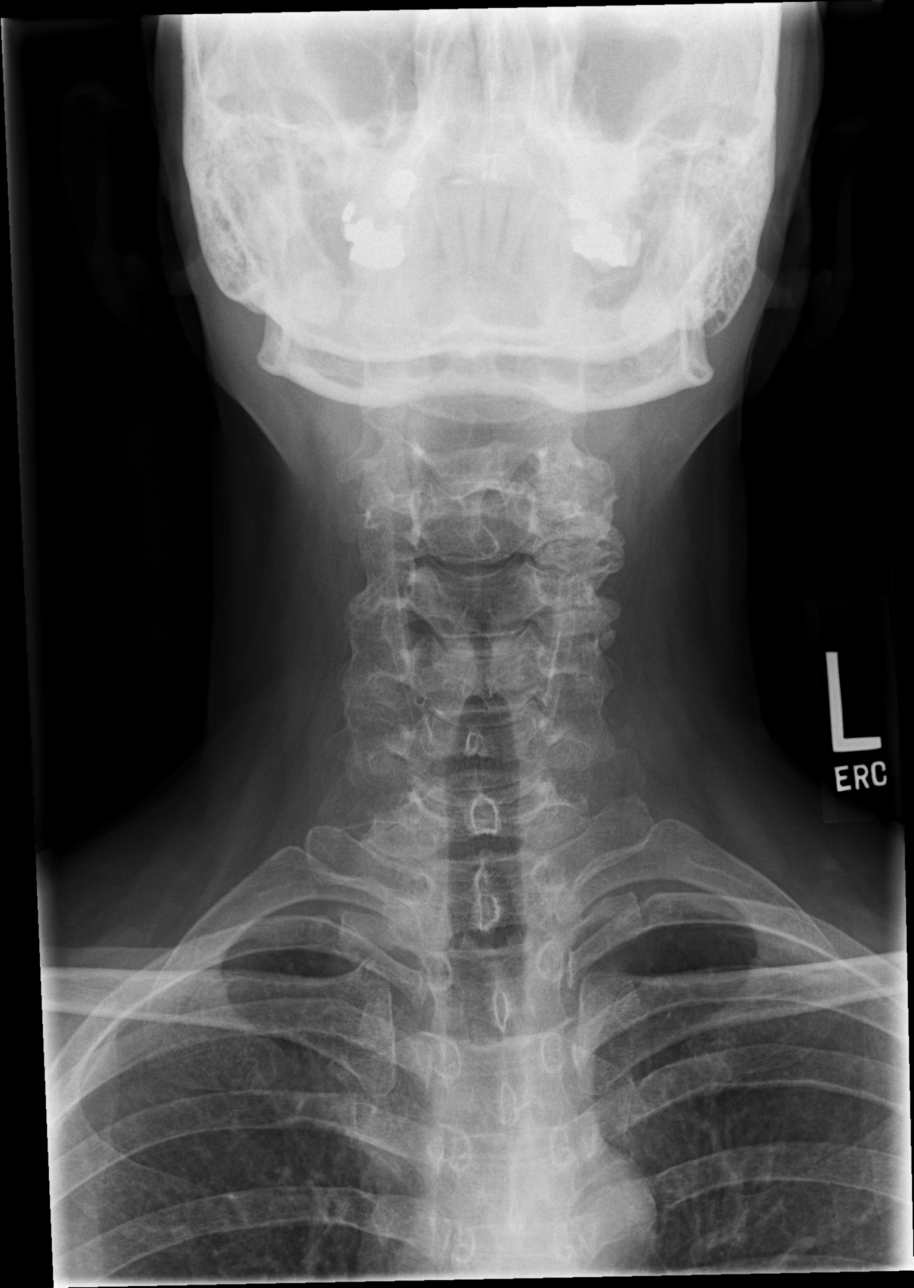
[im 4/4]
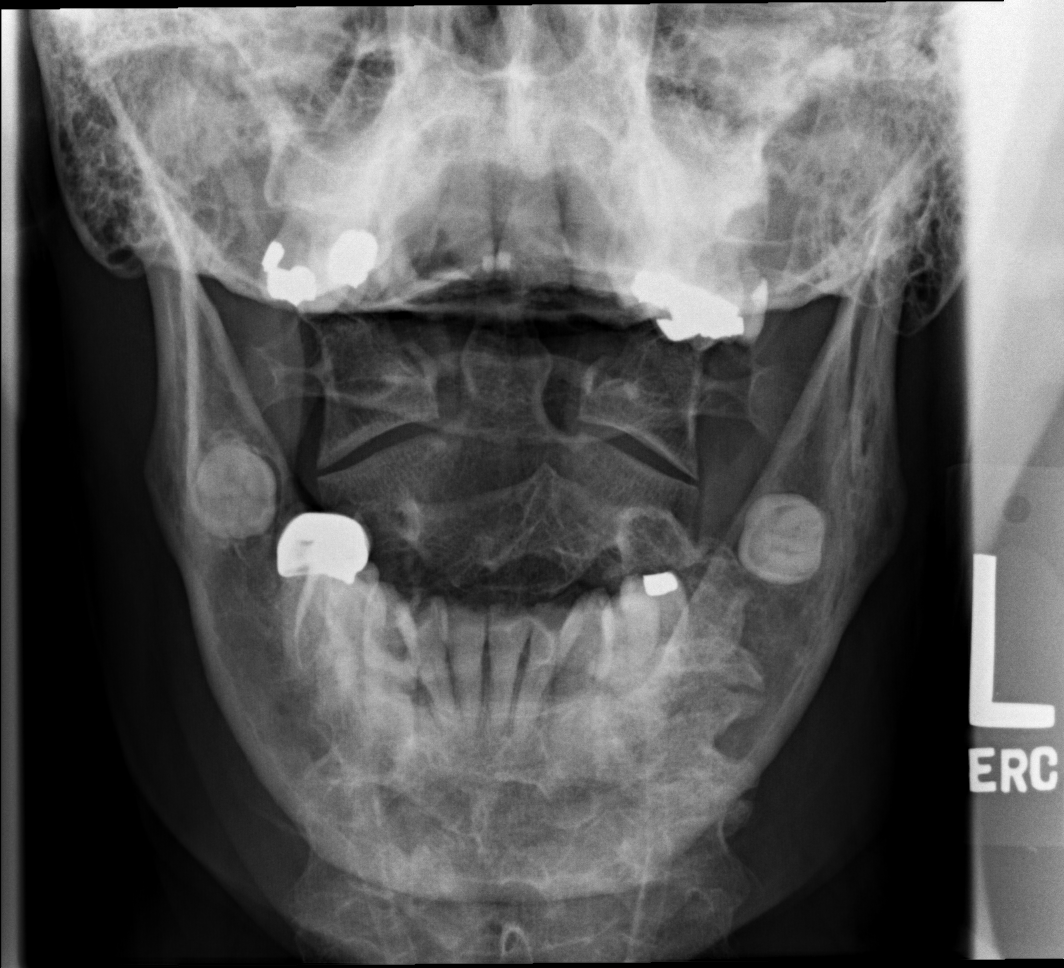

[4 of 4 positions shown; findings below may reference images not displayed]

FINDINGS: AP, lateral and odontoid views of the cervical spine are provided.

The cervical spine is visualized to the level of T1.

The vertebral body heights are maintained. The alignment is normal. The
prevertebral soft tissues are normal. There is no acute fracture or static
listhesis. There is degenerative disc disease at C6-C7.
IMPRESSION: No acute osseous injury of the cervical spine.

[REDACTED]

## 2012-11-08 ENCOUNTER — Ambulatory Visit: Payer: Self-pay

## 2012-11-08 IMAGING — MG MM DIGITAL SCREENING BILAT W/ CAD
1 series · 4 of 4 positions shown · non-contrast
Comparison: none

REASON FOR EXAM: scr mammo [REDACTED]
COMMENTS:

PROCEDURE:     MAM - MAM [REDACTED] DIG SCREEN MAM W/CAD  - [DATE]  [DATE]
RESULT:     Comparison made to prior studies dated [DATE]. No mass.
Breast are dense and nodular with benign calcification.

[R CC · right · 4 of 4 slices shown]
[im 1/4]
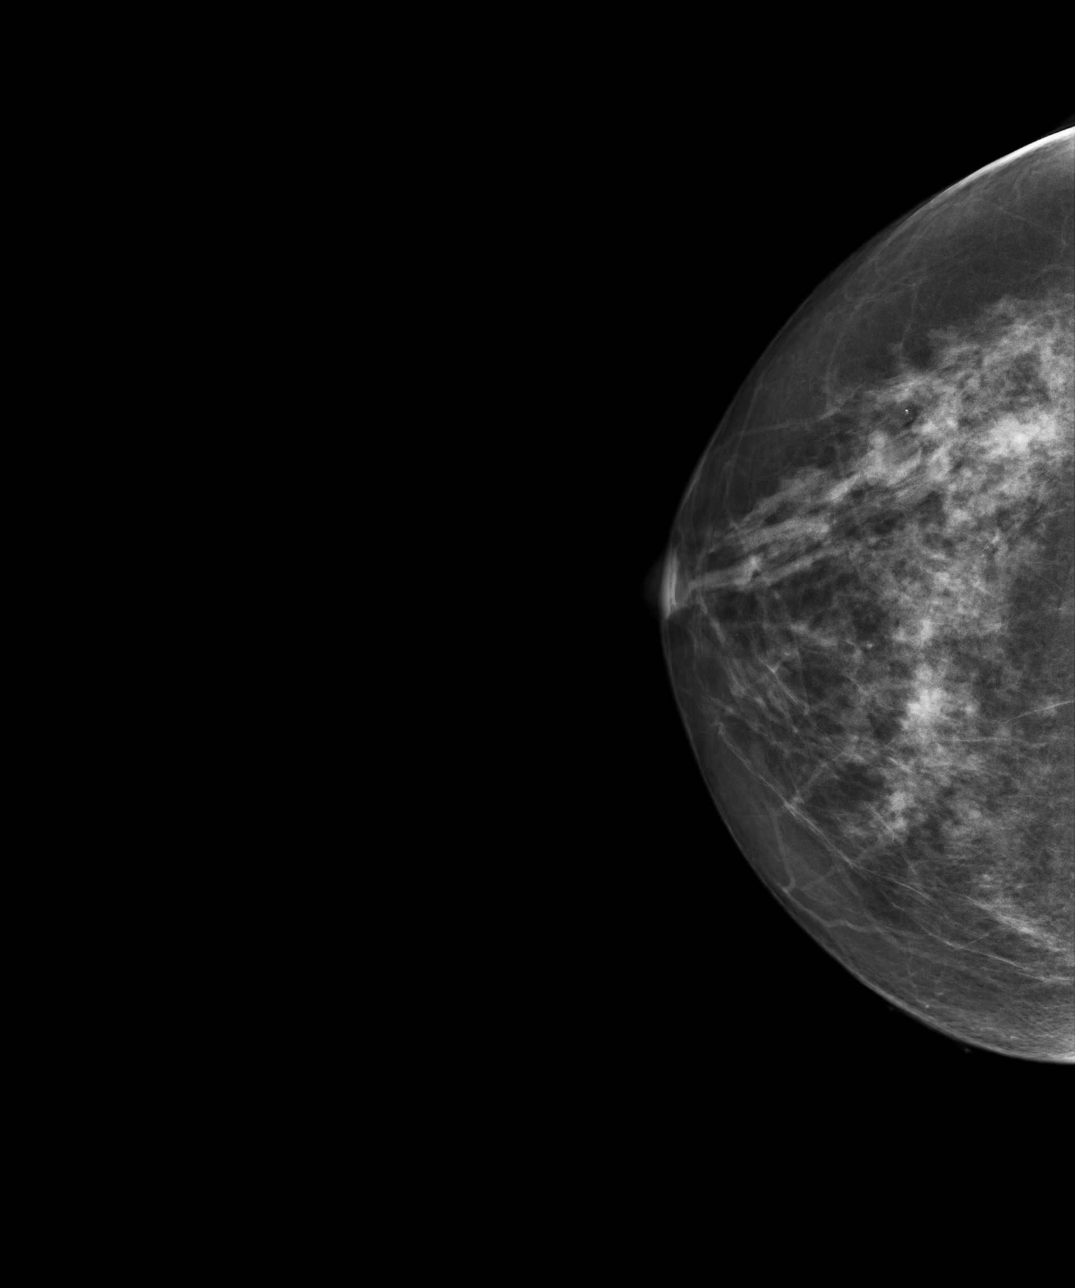
[im 2/4]
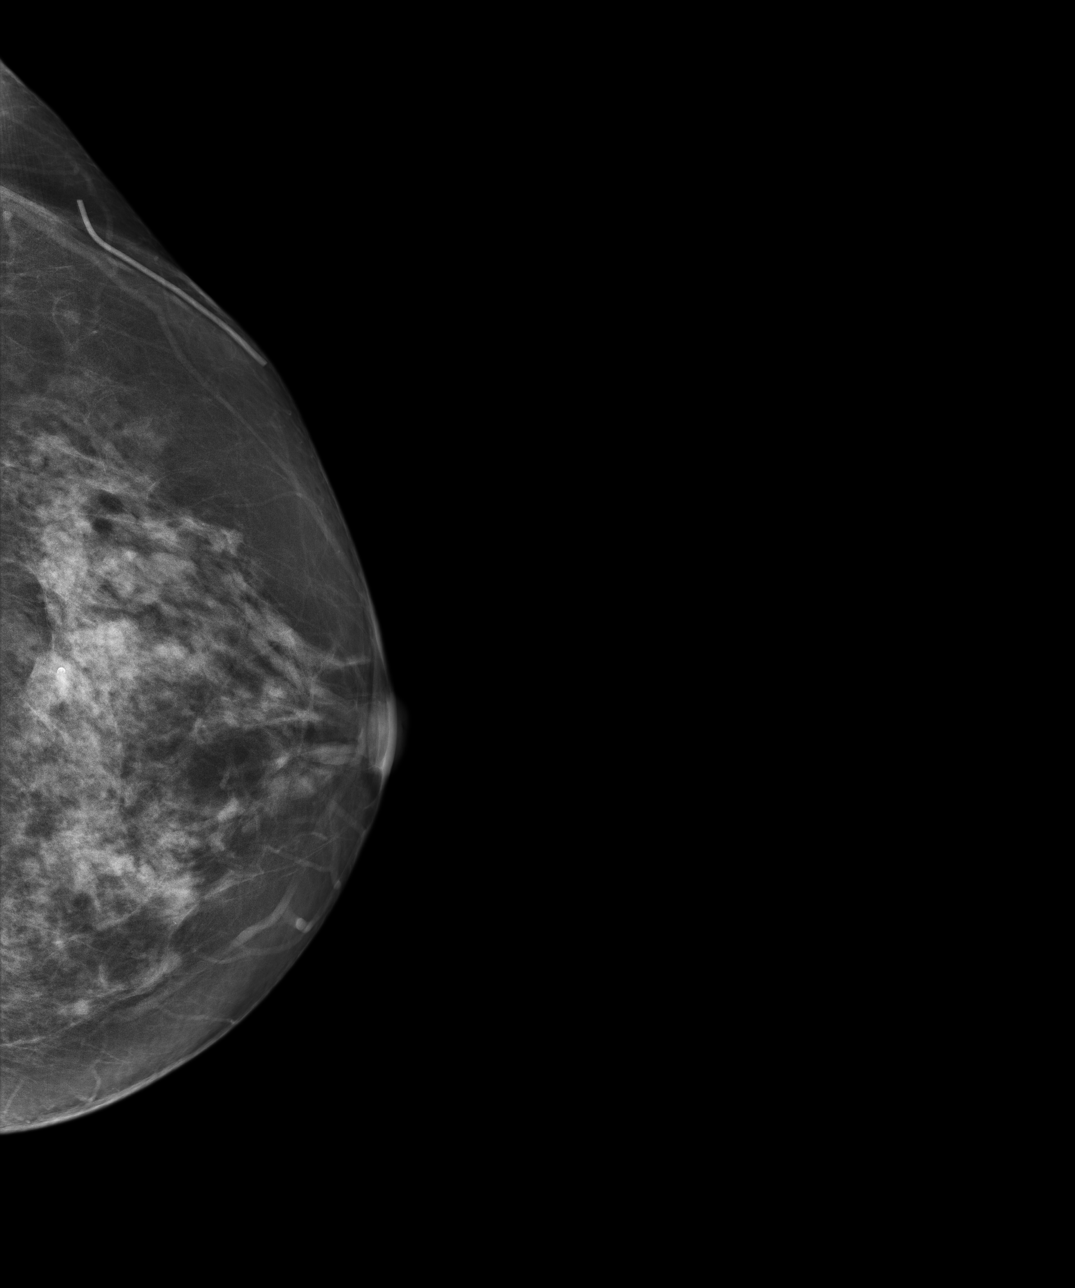
[im 3/4]
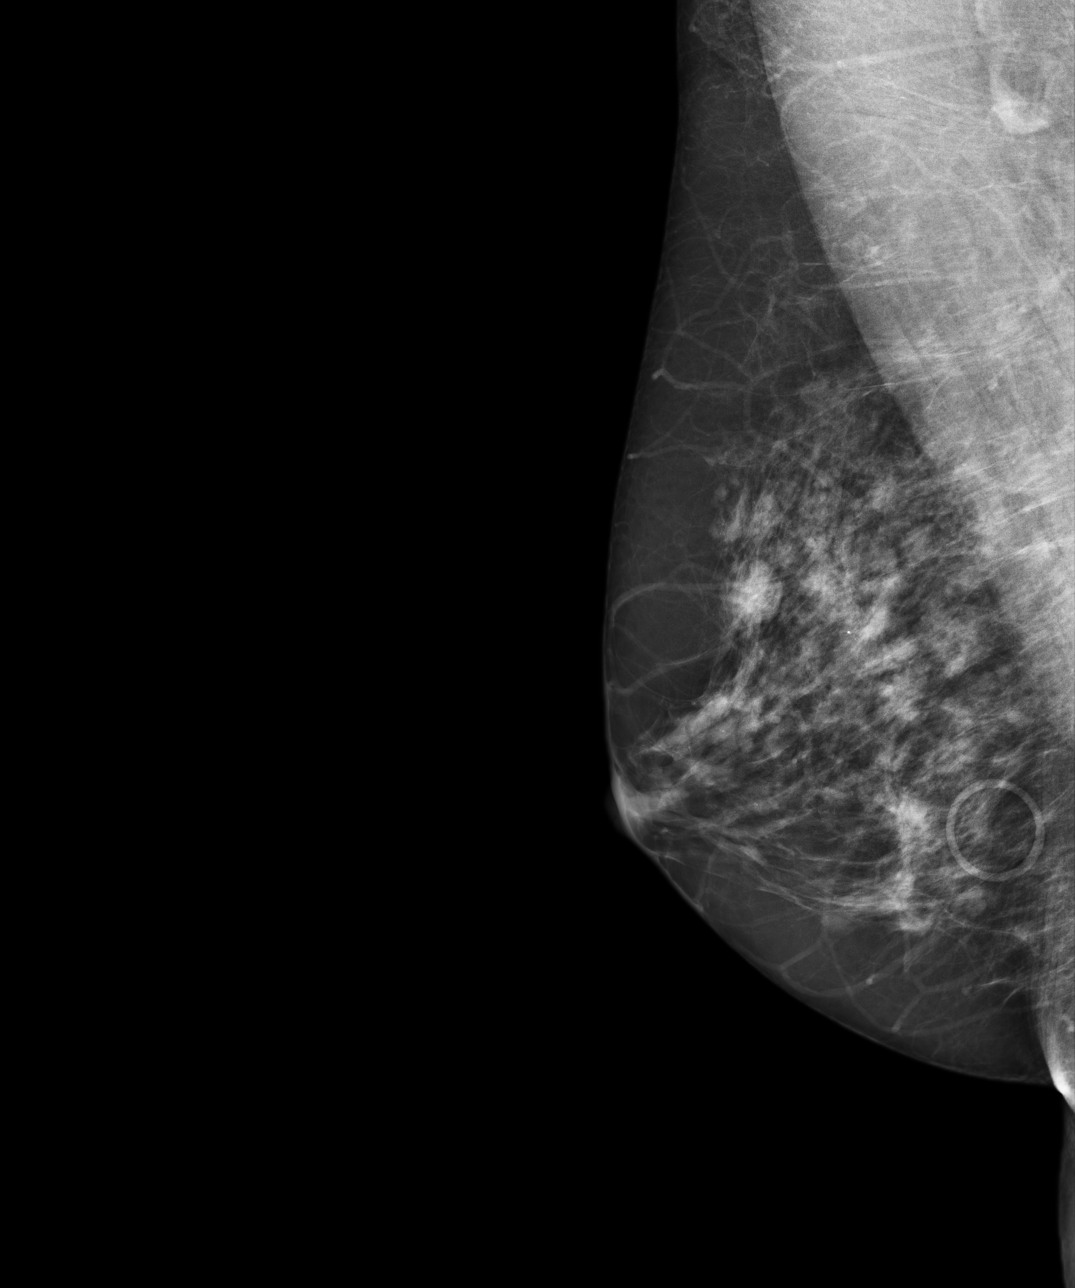
[im 4/4]
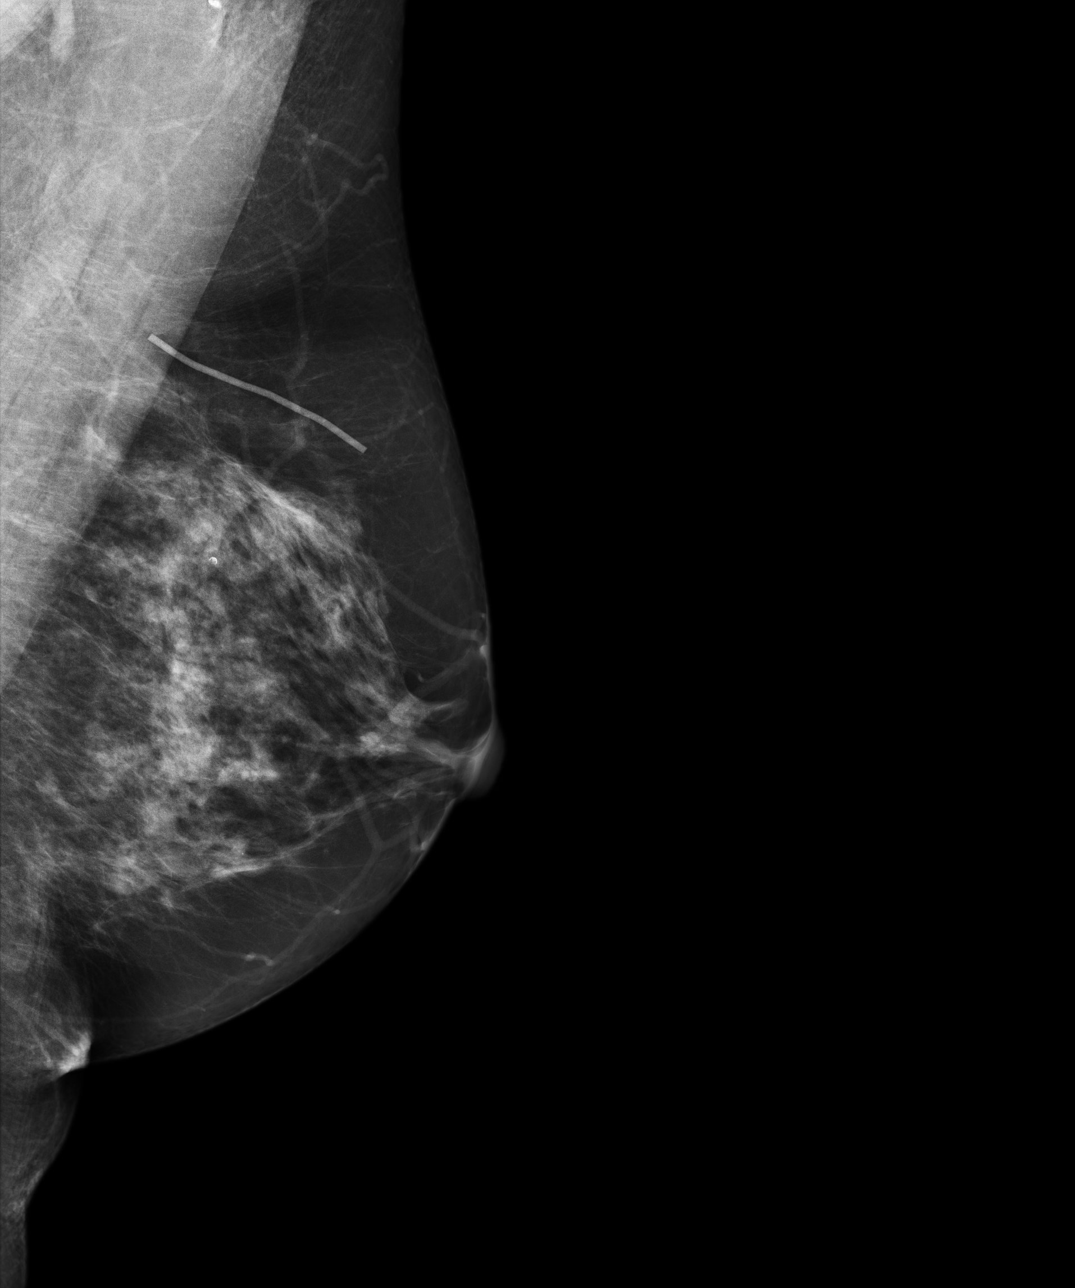

[4 of 4 positions shown; findings below may reference images not displayed]

IMPRESSION: Benign exam. Yearly followup mammogram suggested.

BI-RADS: Category 2- Benign Finding

A NEGATIVE MAMMOGRAM REPORT DOES NOT PRECLUDE BIOPSY OR OTHER EVALUATION OF
A CLINICALLY PALPABLE OR OTHERWISE SUSPICIOUS MASS OR LESION. BREAST CANCER
MAY NOT BE DETECTED IN UP TO 10% OF CASES.

Thank you for the oppurtunity to contribute to the care of your patient. .
BREAST COMPOSITION: The breast composition is HETEROGENEOUSLY DENSE
(glandular tissue is 51-75%) This may decrease the sensitivity of
mammography.

## 2013-06-14 ENCOUNTER — Emergency Department: Payer: Self-pay | Admitting: Internal Medicine

## 2013-06-14 IMAGING — CR DG CHEST 2V
1 series · 2 of 2 positions shown · non-contrast
Comparison: [DATE]

CLINICAL DATA: Chest pain at inspiration

EXAM:
CHEST  2 VIEW

[Series 1: pa · 0.17mm/px · 2 of 2 slices shown]
[im 1/2]
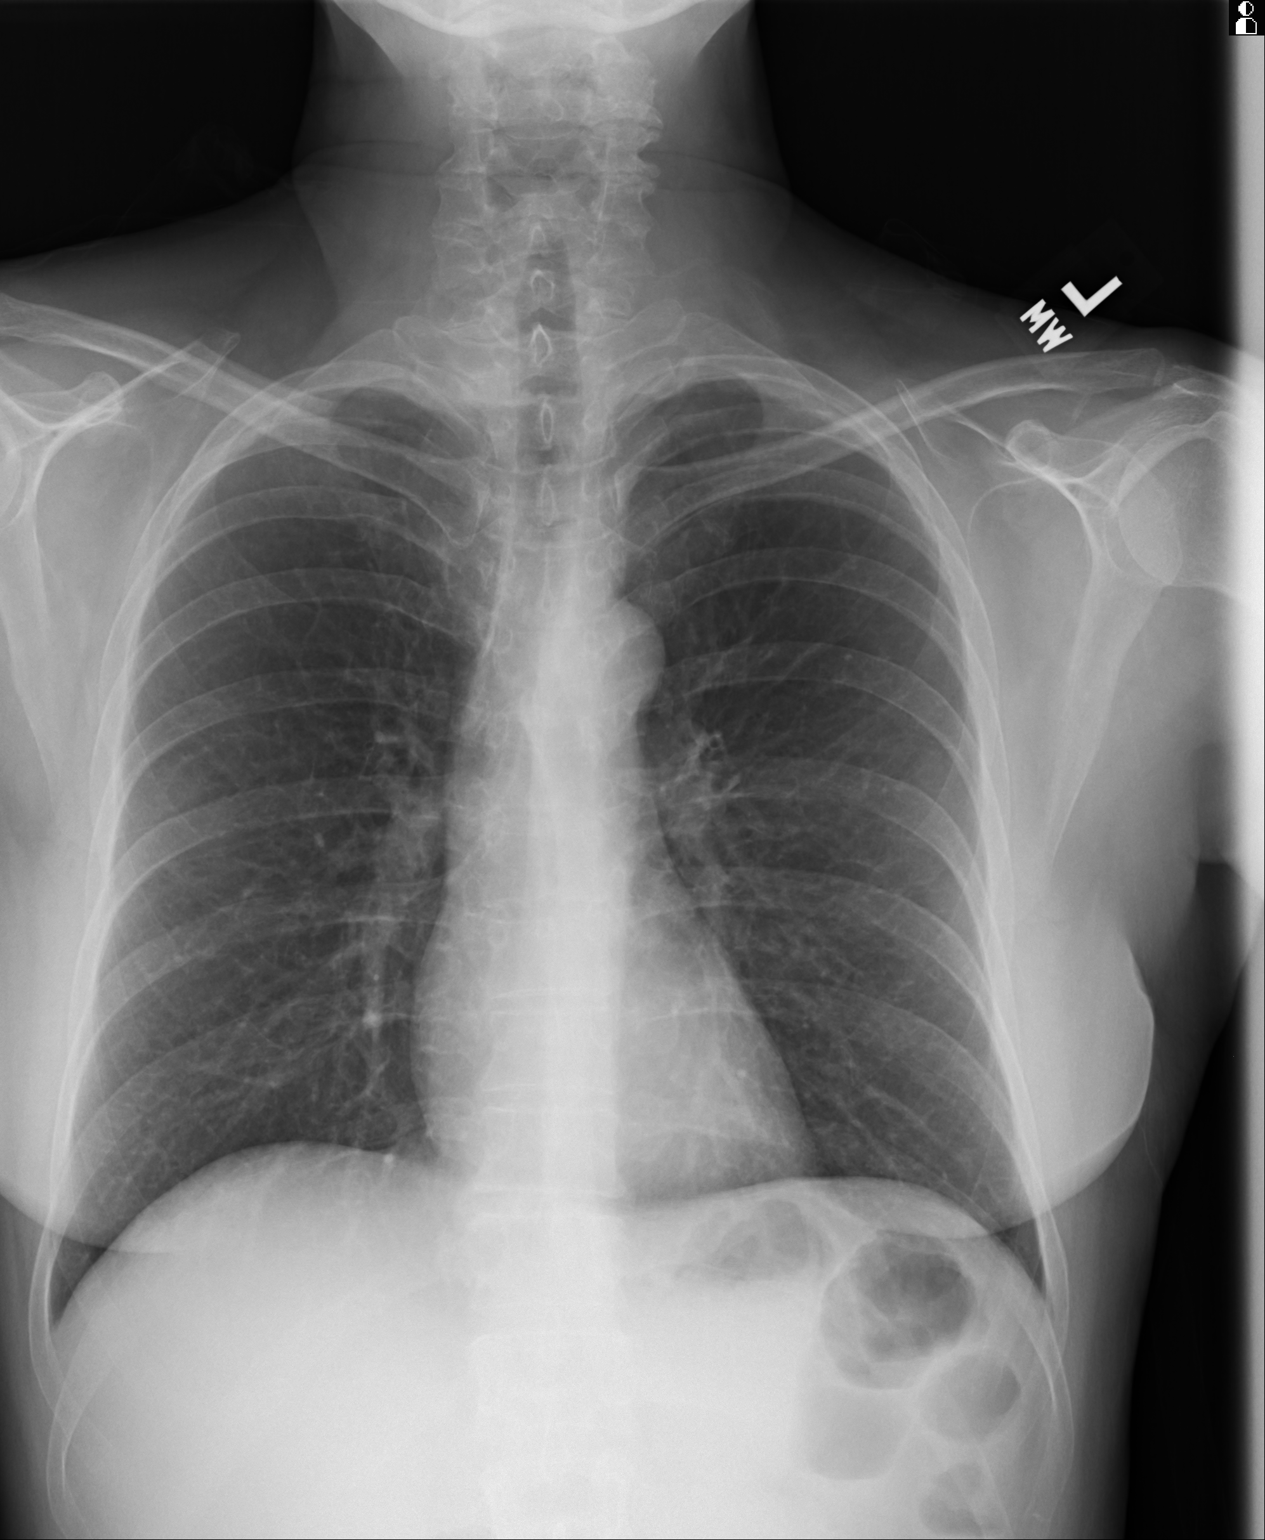
[im 2/2]
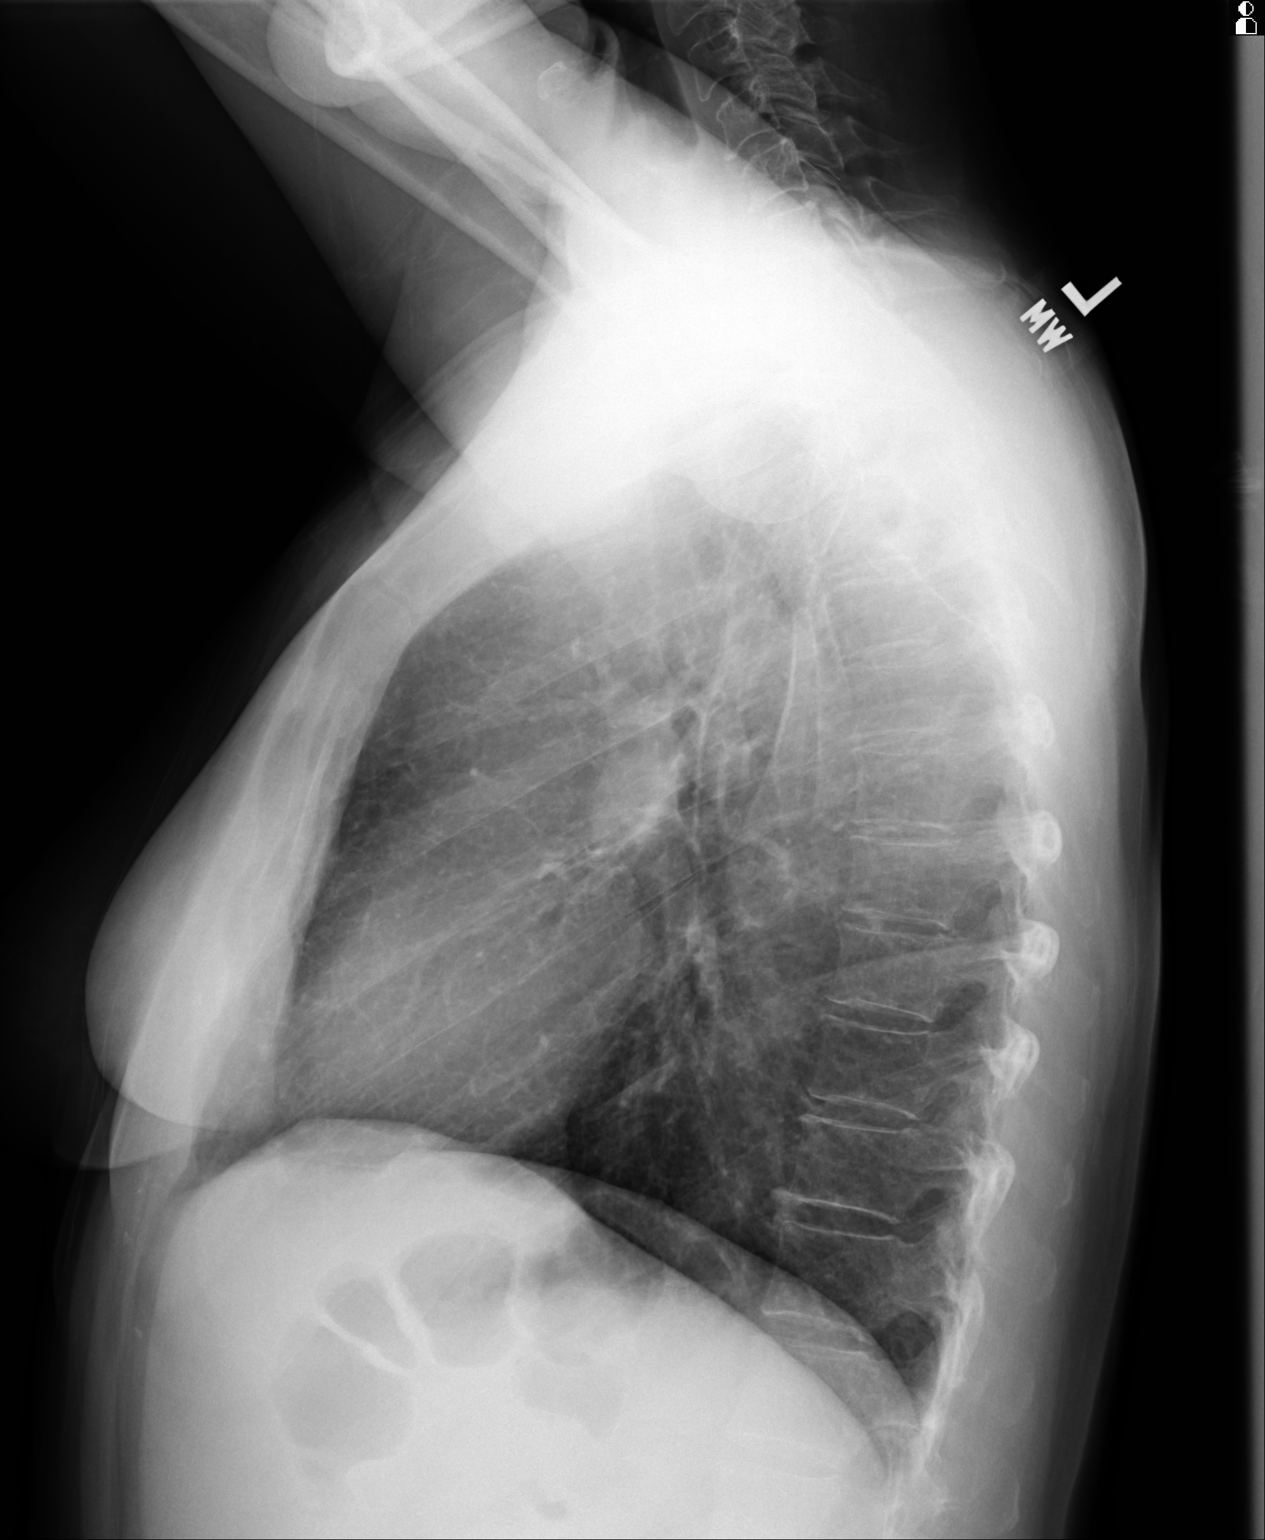

[2 of 2 positions shown; findings below may reference images not displayed]

FINDINGS: The heart size and mediastinal contours are within normal limits.
Both lungs are clear. The visualized skeletal structures are
unremarkable.
IMPRESSION: No active cardiopulmonary disease.

## 2014-01-28 ENCOUNTER — Ambulatory Visit: Payer: Self-pay

## 2014-02-05 ENCOUNTER — Ambulatory Visit: Payer: Self-pay

## 2014-02-05 IMAGING — MG MM DIGITAL DIAGNOSTIC BILAT W/ CAD
2 series · 7 of 7 positions shown · non-contrast
Comparison: [DATE], [DATE], [DATE], [DATE],
[DATE]

CLINICAL DATA: Patient referred for left breast nodule at 1
o'clock, periareolar.

EXAM:
DIGITAL DIAGNOSTIC  BILATERAL MAMMOGRAM WITH CAD
ULTRASOUND LEFT BREAST

[R CC · right · 5 of 5 slices shown]
[im 1/5]
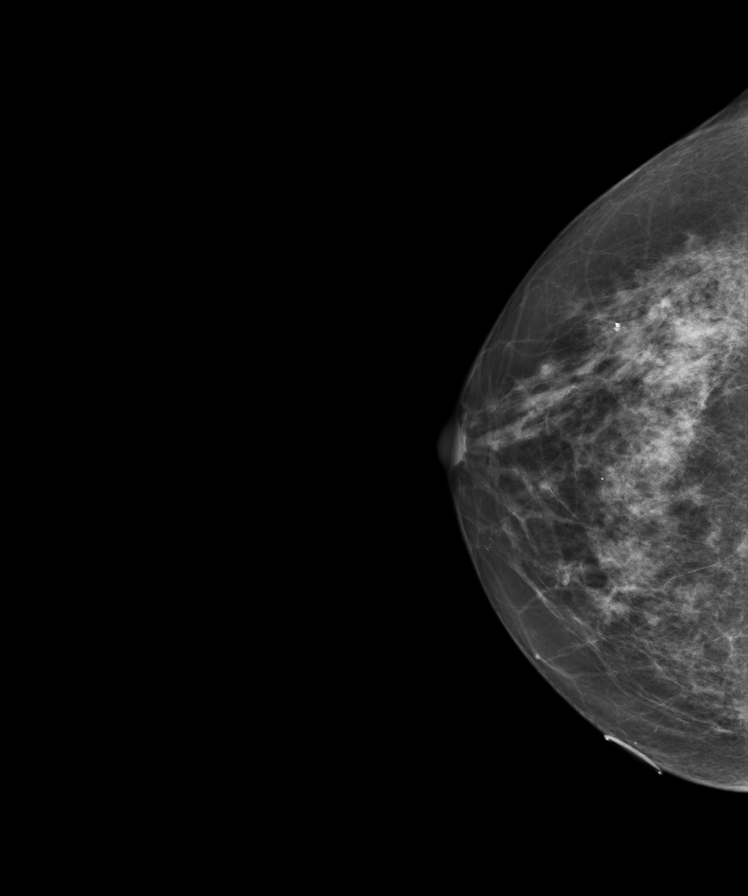
[im 2/5]
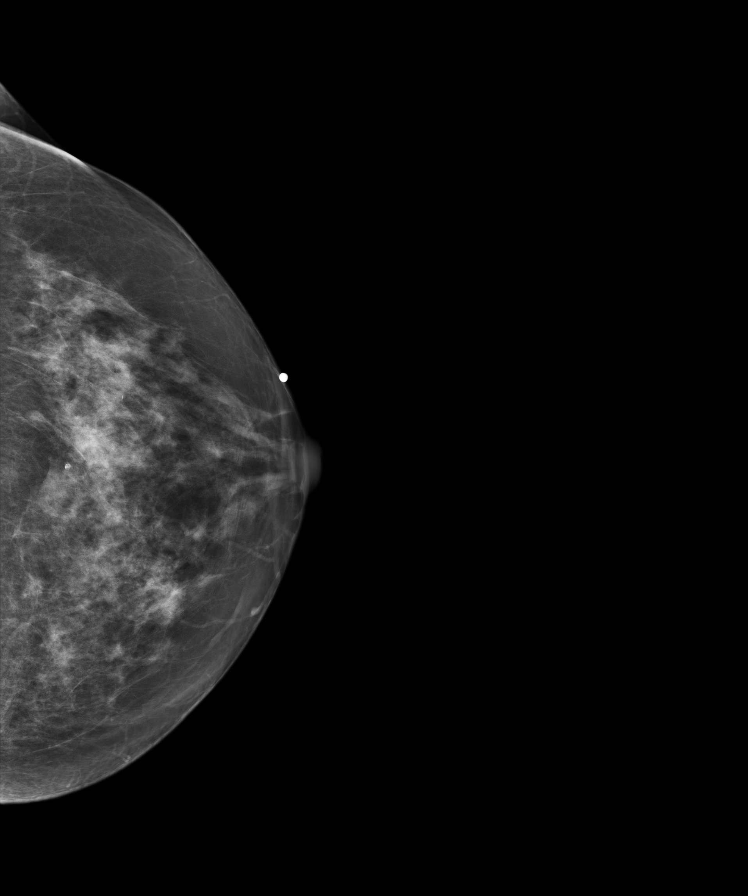
[im 3/5]
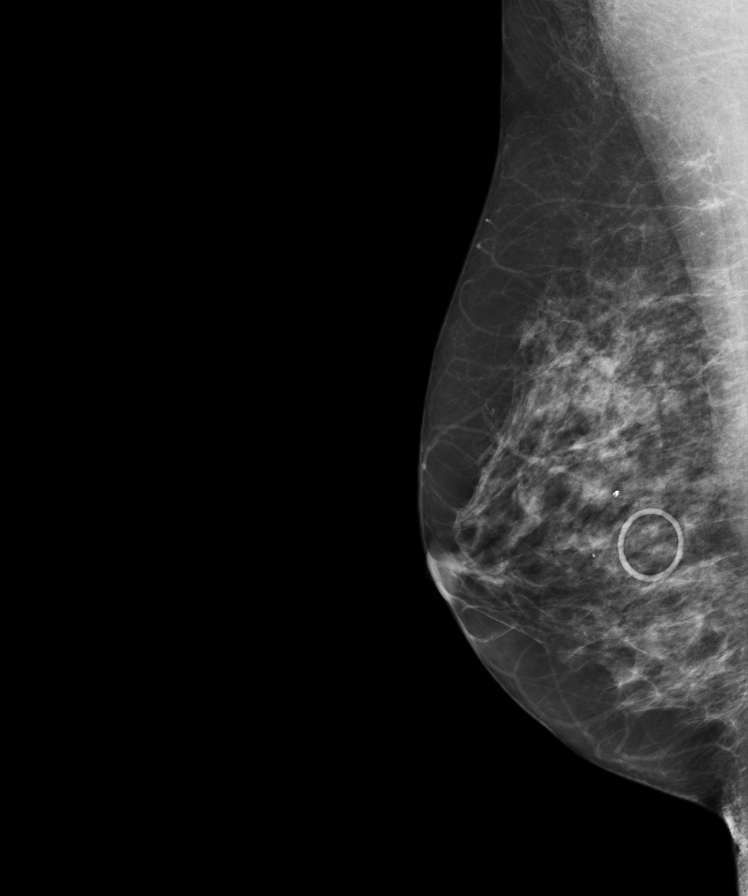
[im 4/5]
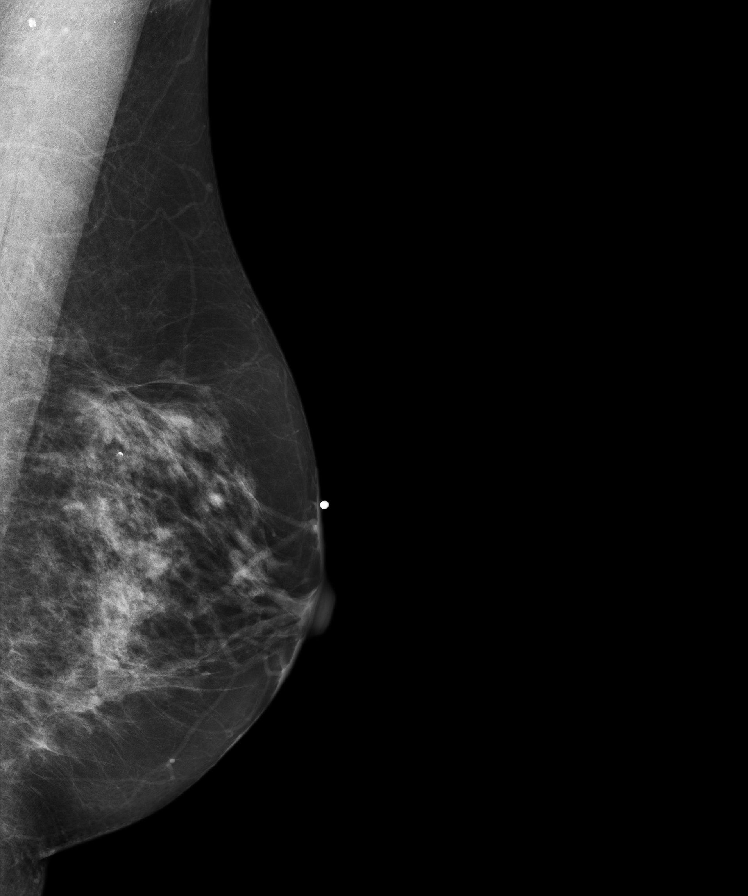
[im 5/5]
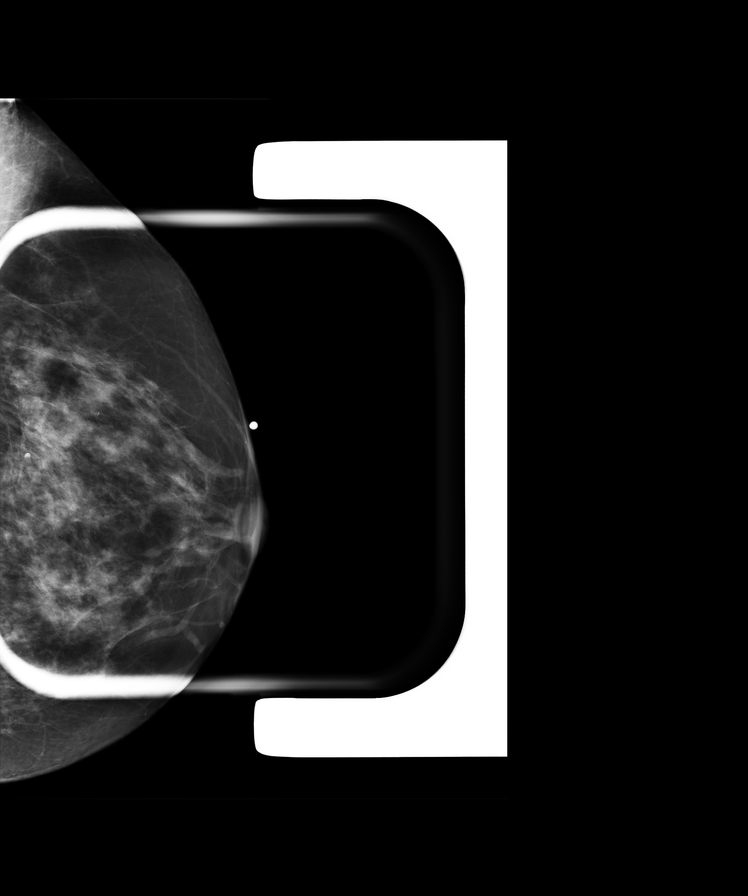

[L CC · left · 2 of 2 slices shown]
[im 1/2]
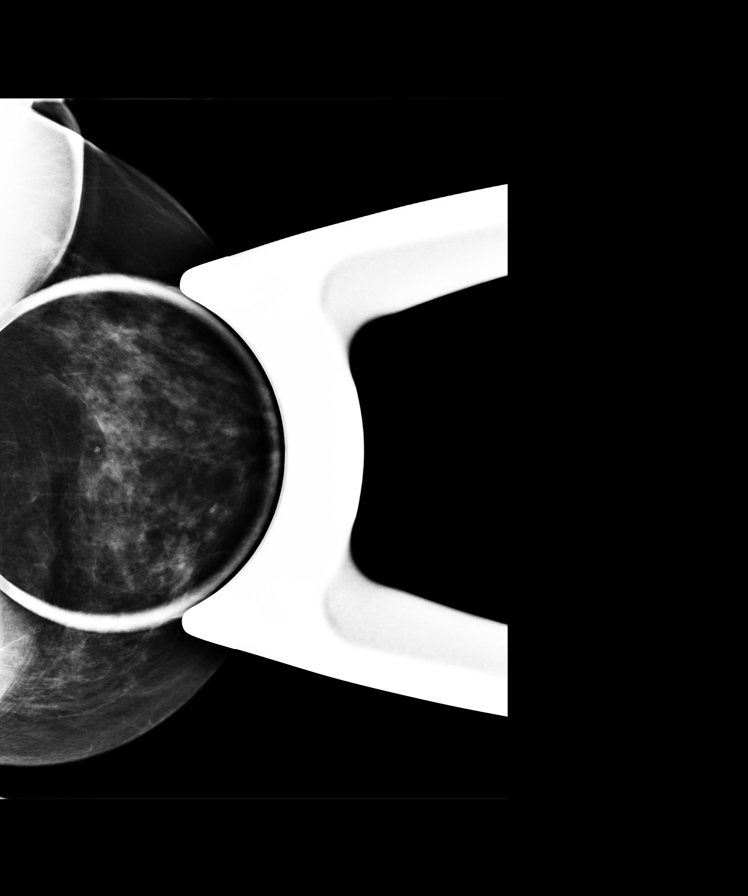
[im 2/2]
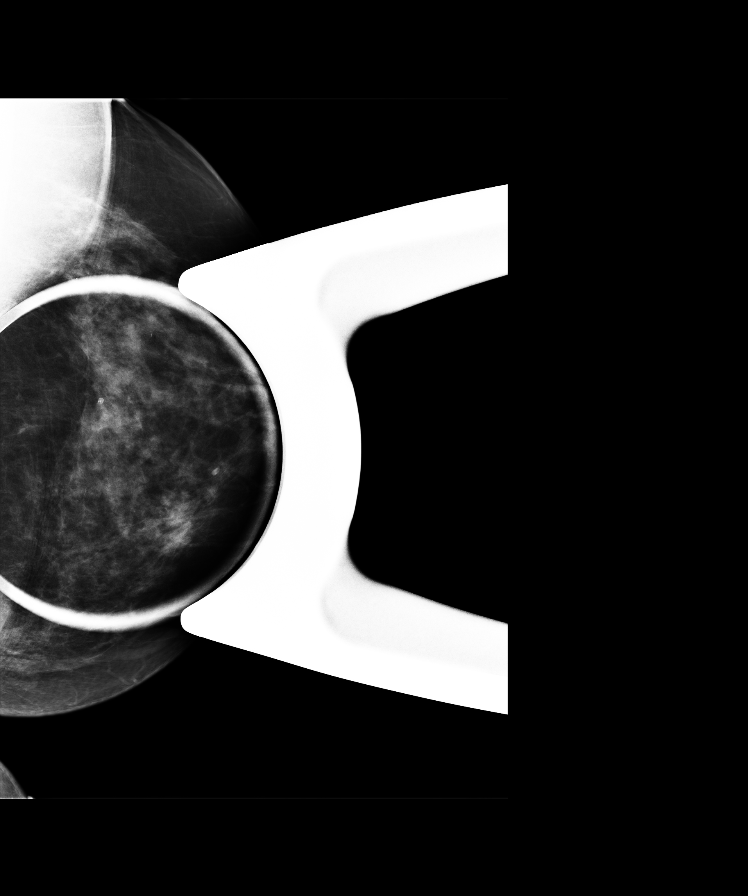

[7 of 7 positions shown; findings below may reference images not displayed]

ACR Breast Density Category c: The breast tissue is heterogeneously
dense, which may obscure small masses.
FINDINGS: No suspicious mass, calcifications, or other abnormality.
Specifically, no mammographic abnormality is seen underlying the
palpable marker.

Mammographic images were processed with CAD.

On physical exam, a small ridge of tissue is palpated in the 1
o'clock, periareolar region.

Ultrasound is performed, showing no suspicious cystic or solid
sonographic abnormality in the region of palpable concern at 1
o'clock.
IMPRESSION: No evidence of malignancy.

RECOMMENDATION:
Screening mammogram in one year.(Code:[Q1])

The patient was instructed to follow-up with her referring physician
regarding the area of concern.

I have discussed the findings and recommendations with the patient.
Results were also provided in writing at the conclusion of the
visit. If applicable, a reminder letter will be sent to the patient
regarding the next appointment.

BI-RADS CATEGORY  1: Negative.

## 2014-06-11 ENCOUNTER — Emergency Department: Payer: Self-pay | Admitting: Emergency Medicine

## 2014-06-11 IMAGING — CR RIGHT FOOT COMPLETE - 3+ VIEW
1 series · 3 of 3 positions shown · non-contrast
Comparison: None.

CLINICAL DATA: Foot pain, no known injury.  Initial encounter

EXAM:
RIGHT FOOT COMPLETE - 3+ VIEW

[Series 1: dxr foot rt complete w/obliques · 0.14mm/px · 3 of 3 slices shown]
[im 1/3]
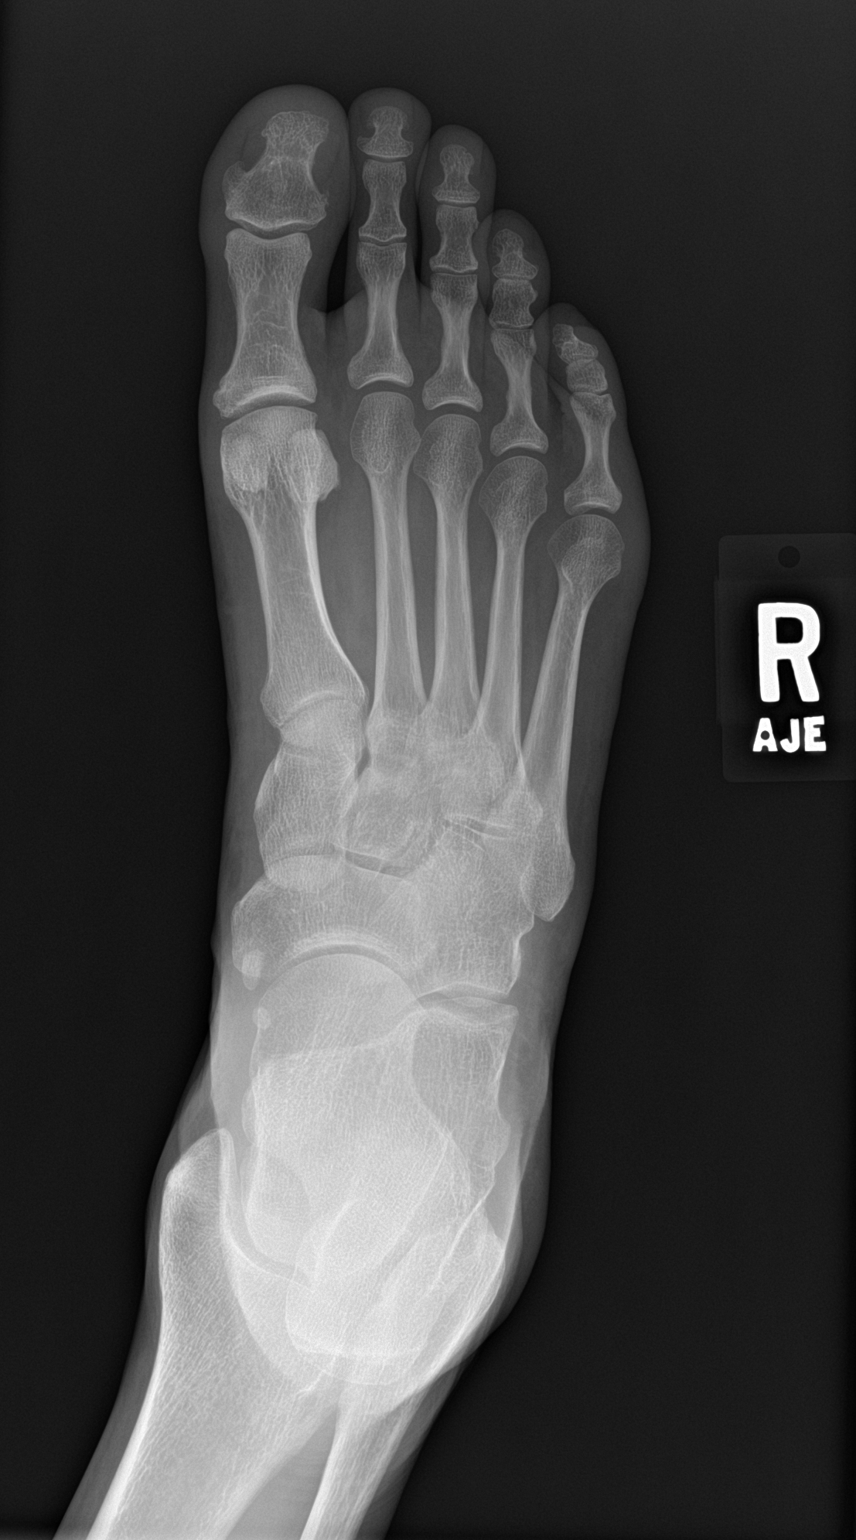
[im 2/3]
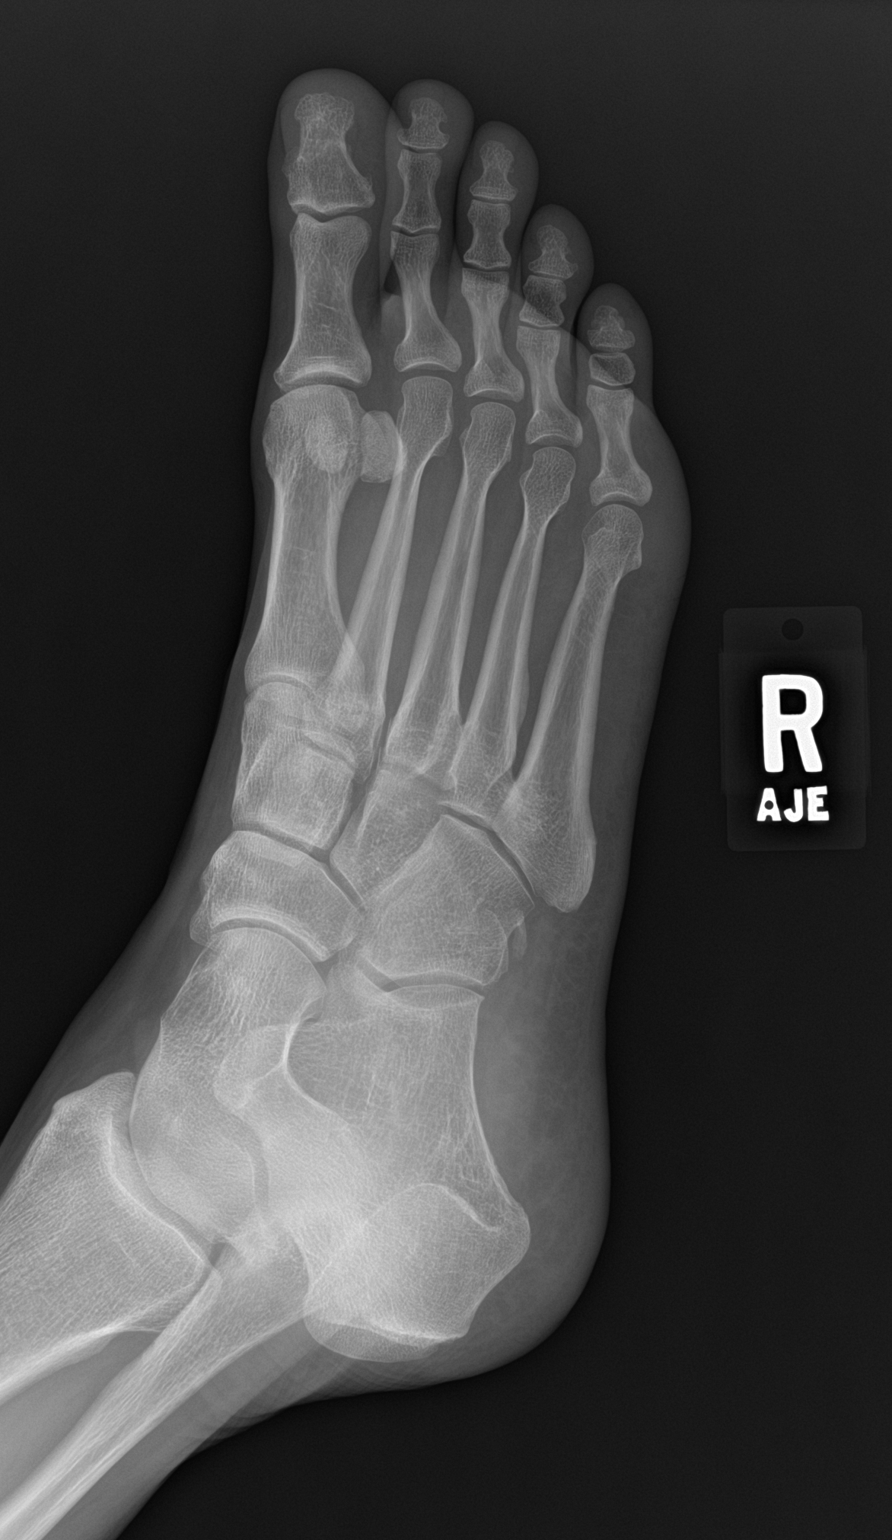
[im 3/3]
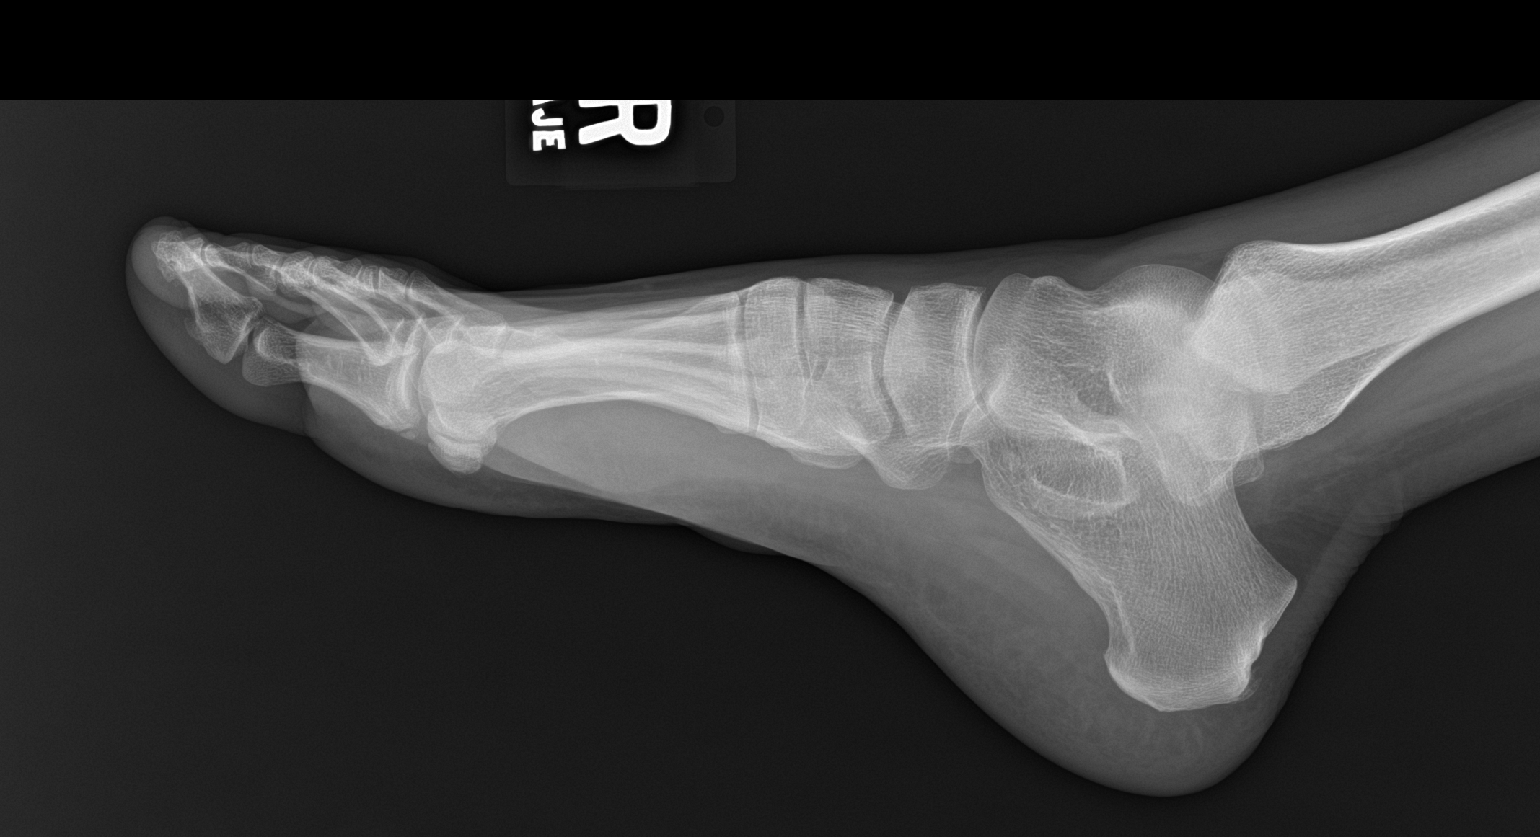

[3 of 3 positions shown; findings below may reference images not displayed]

FINDINGS: There is no evidence of fracture or dislocation. There is no
evidence of arthropathy or other focal bone abnormality. Soft
tissues are unremarkable.
IMPRESSION: Negative.

## 2014-09-23 ENCOUNTER — Emergency Department
Admit: 2014-09-23 | Disposition: A | Discharge status: Home / Self Care | Payer: Self-pay | Admitting: Emergency Medicine

## 2014-10-17 ENCOUNTER — Emergency Department: Admit: 2014-10-17 | Disposition: A | Discharge status: Home / Self Care | Payer: Self-pay | Admitting: Internal Medicine

## 2014-10-17 IMAGING — CR DG FOOT COMPLETE 3+V*L*
1 series · 3 of 3 positions shown · non-contrast
Comparison: None.

CLINICAL DATA: Dropped trash scan on foot 1 week ago

EXAM:
LEFT FOOT - COMPLETE 3+ VIEW

[Series 1: ap · 0.17mm/px · 3 of 3 slices shown]
[im 1/3]
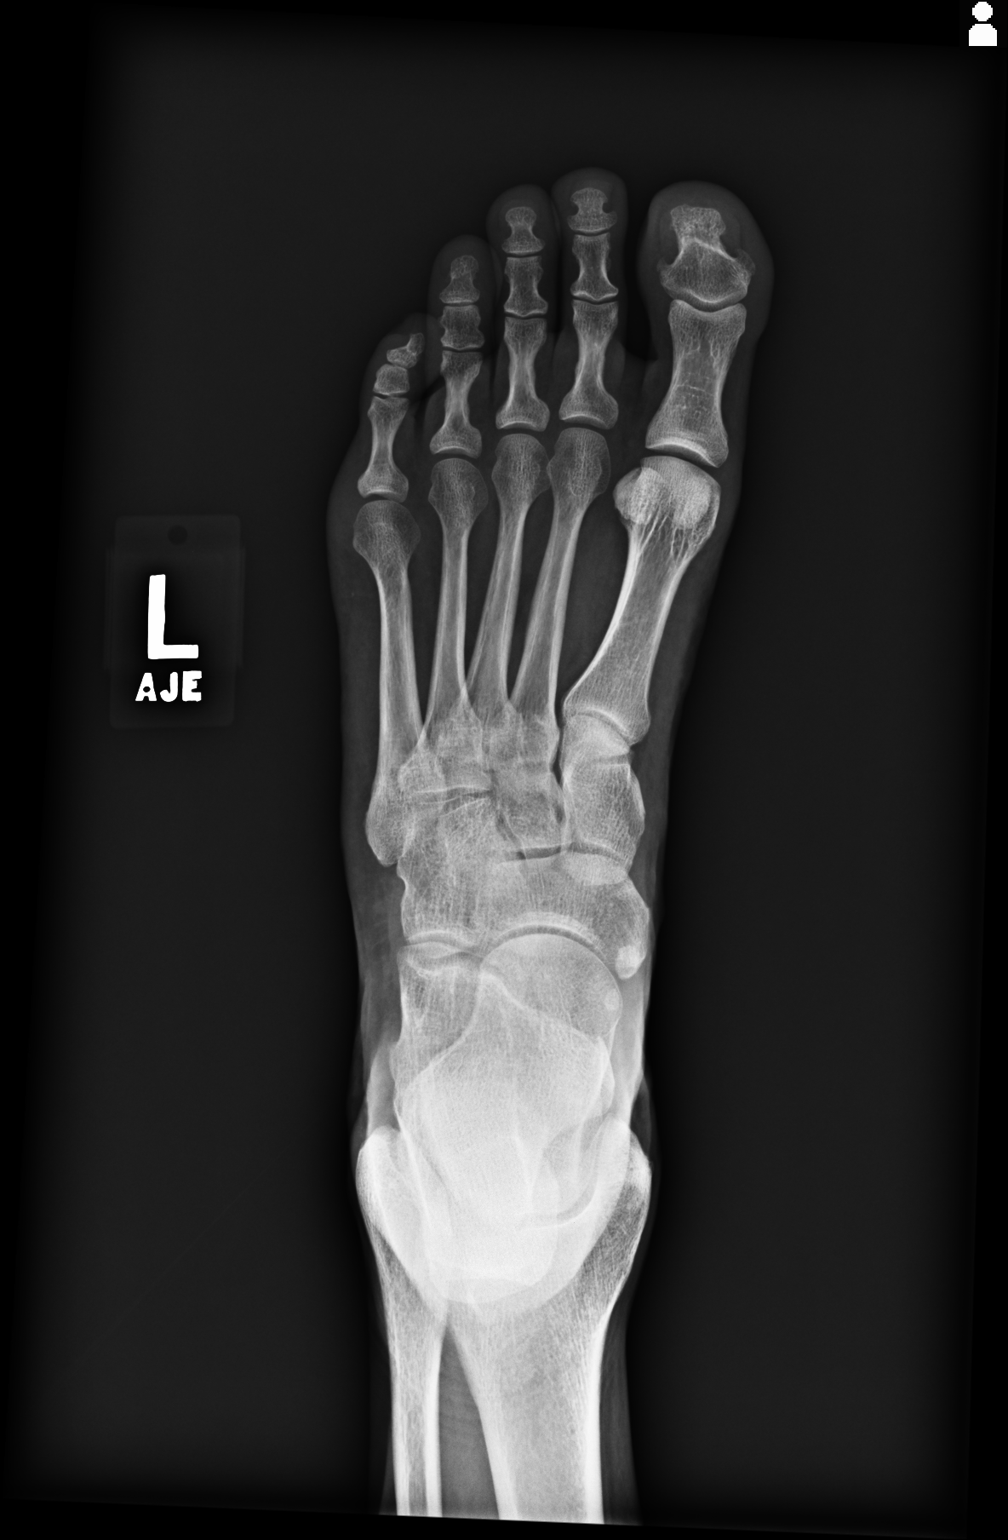
[im 2/3]
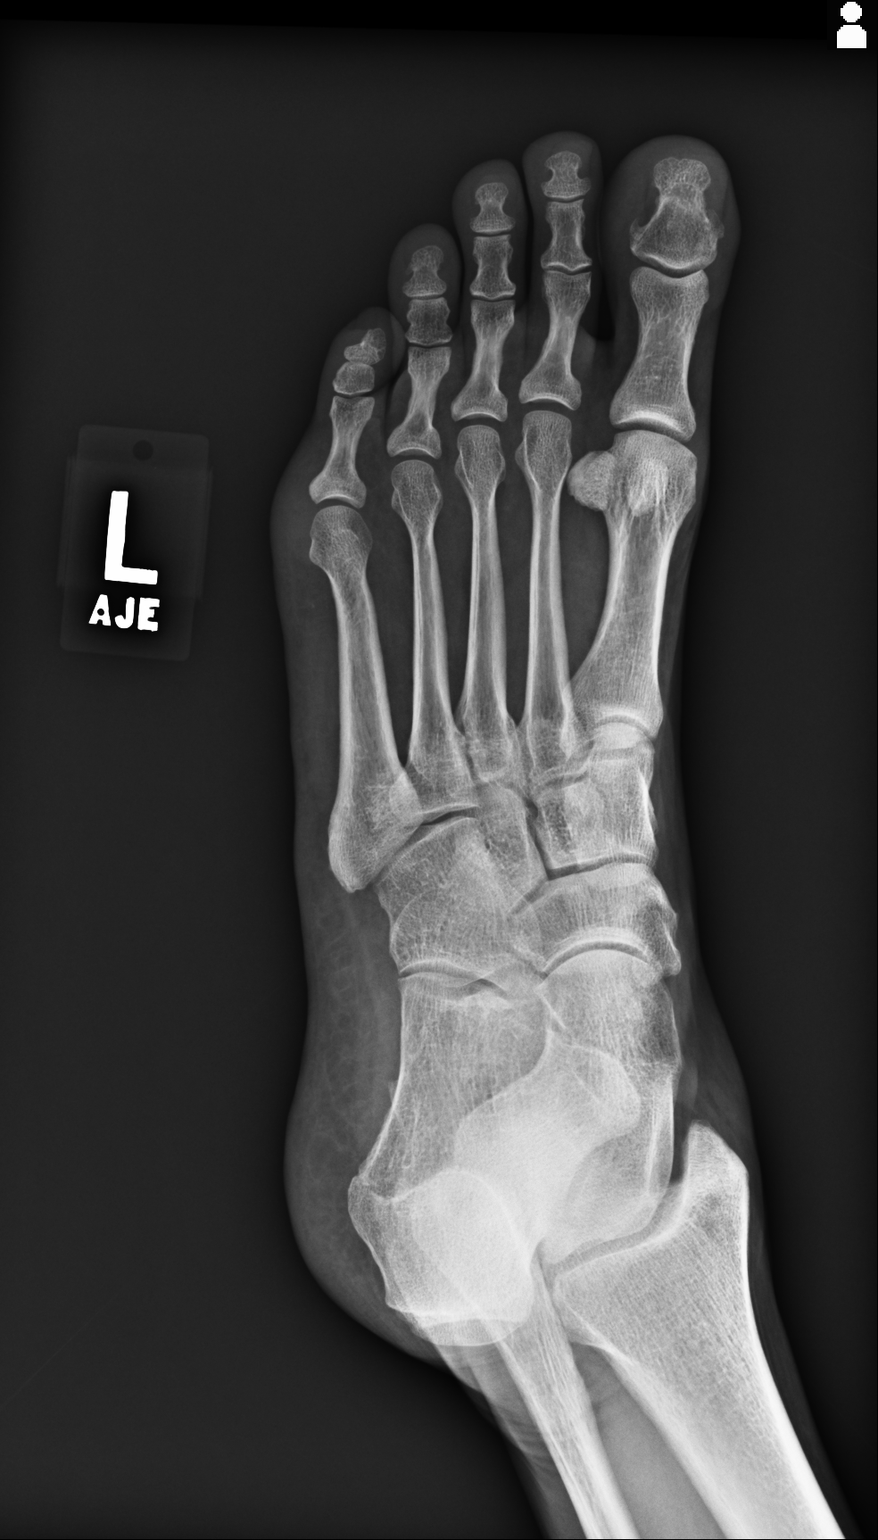
[im 3/3]
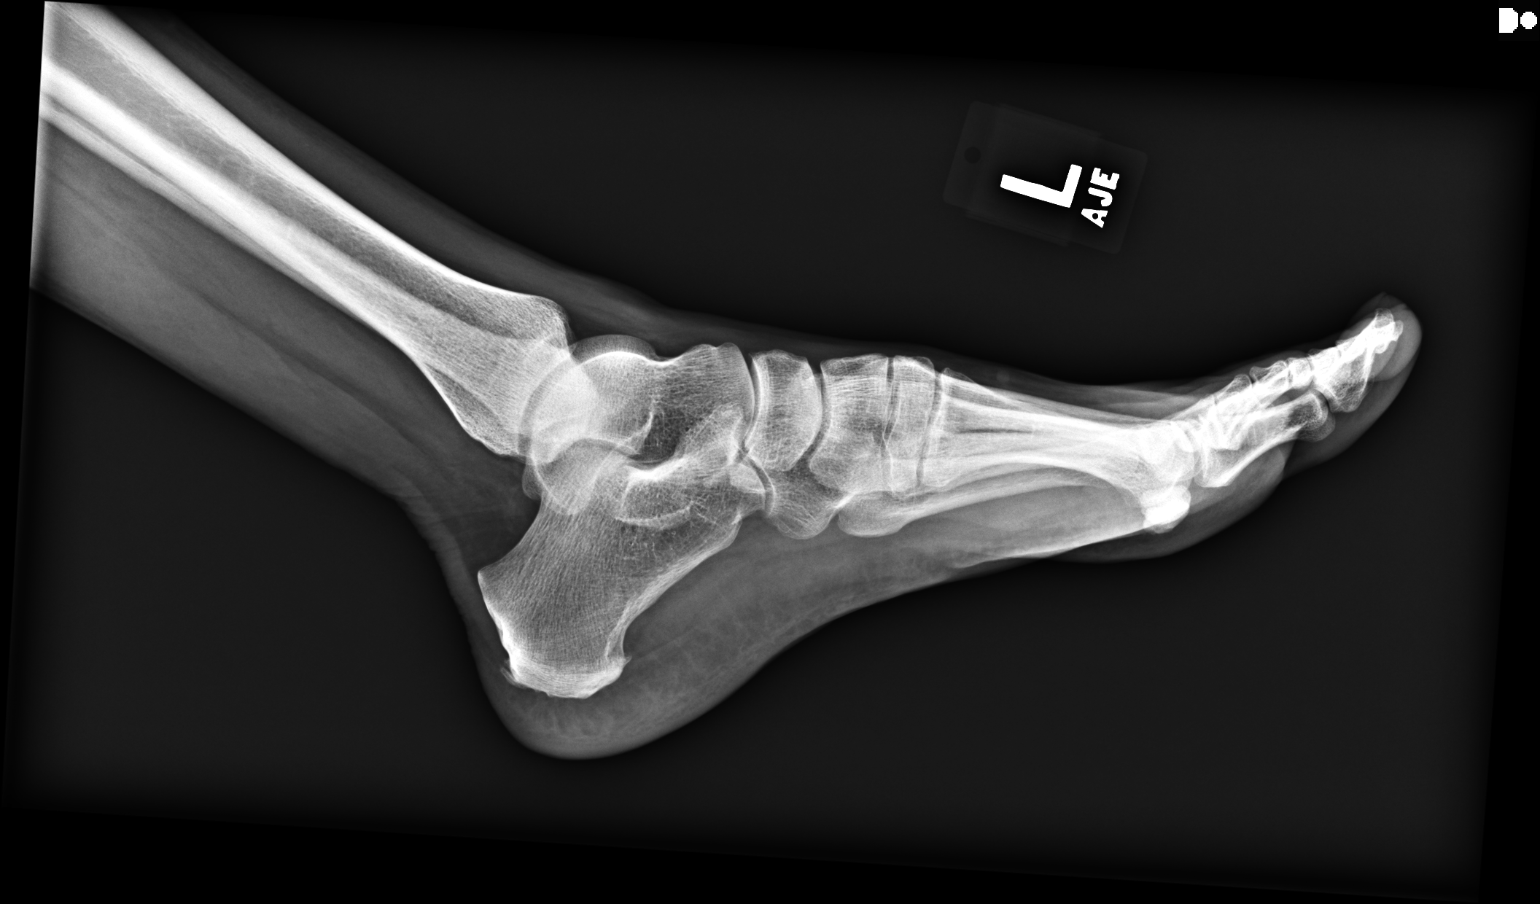

[3 of 3 positions shown; findings below may reference images not displayed]

FINDINGS: There is no evidence of fracture or dislocation. There is no
evidence of arthropathy or other focal bone abnormality. Soft
tissues are unremarkable.
IMPRESSION: Negative.

## 2014-11-20 ENCOUNTER — Encounter: Payer: Self-pay | Admitting: Emergency Medicine

## 2014-11-20 ENCOUNTER — Emergency Department
Admission: EM | Admit: 2014-11-20 | Discharge: 2014-11-20 | Disposition: A | Discharge status: Home / Self Care | Payer: Self-pay | Attending: Student | Admitting: Student

## 2014-11-20 ENCOUNTER — Emergency Department: Payer: Self-pay

## 2014-11-20 DIAGNOSIS — Z720 Tobacco use: Secondary | ICD-10-CM | POA: Insufficient documentation

## 2014-11-20 DIAGNOSIS — S299XXA Unspecified injury of thorax, initial encounter: Secondary | ICD-10-CM | POA: Insufficient documentation

## 2014-11-20 DIAGNOSIS — Y9289 Other specified places as the place of occurrence of the external cause: Secondary | ICD-10-CM | POA: Insufficient documentation

## 2014-11-20 DIAGNOSIS — Y990 Civilian activity done for income or pay: Secondary | ICD-10-CM | POA: Insufficient documentation

## 2014-11-20 DIAGNOSIS — M791 Myalgia, unspecified site: Secondary | ICD-10-CM

## 2014-11-20 DIAGNOSIS — X58XXXA Exposure to other specified factors, initial encounter: Secondary | ICD-10-CM | POA: Insufficient documentation

## 2014-11-20 DIAGNOSIS — Y9389 Activity, other specified: Secondary | ICD-10-CM | POA: Insufficient documentation

## 2014-11-20 DIAGNOSIS — I10 Essential (primary) hypertension: Secondary | ICD-10-CM | POA: Insufficient documentation

## 2014-11-20 HISTORY — DX: Essential (primary) hypertension: I10

## 2014-11-20 IMAGING — CR DG CHEST 2V
1 series · 2 of 2 positions shown · non-contrast
Comparison: [DATE]

CLINICAL DATA: Right-sided chest/ breast pain.  Smoker.

EXAM:
CHEST  2 VIEW

[Series 1: dg chest 2 view · 0.14mm/px · 2 of 2 slices shown]
[im 1/2]
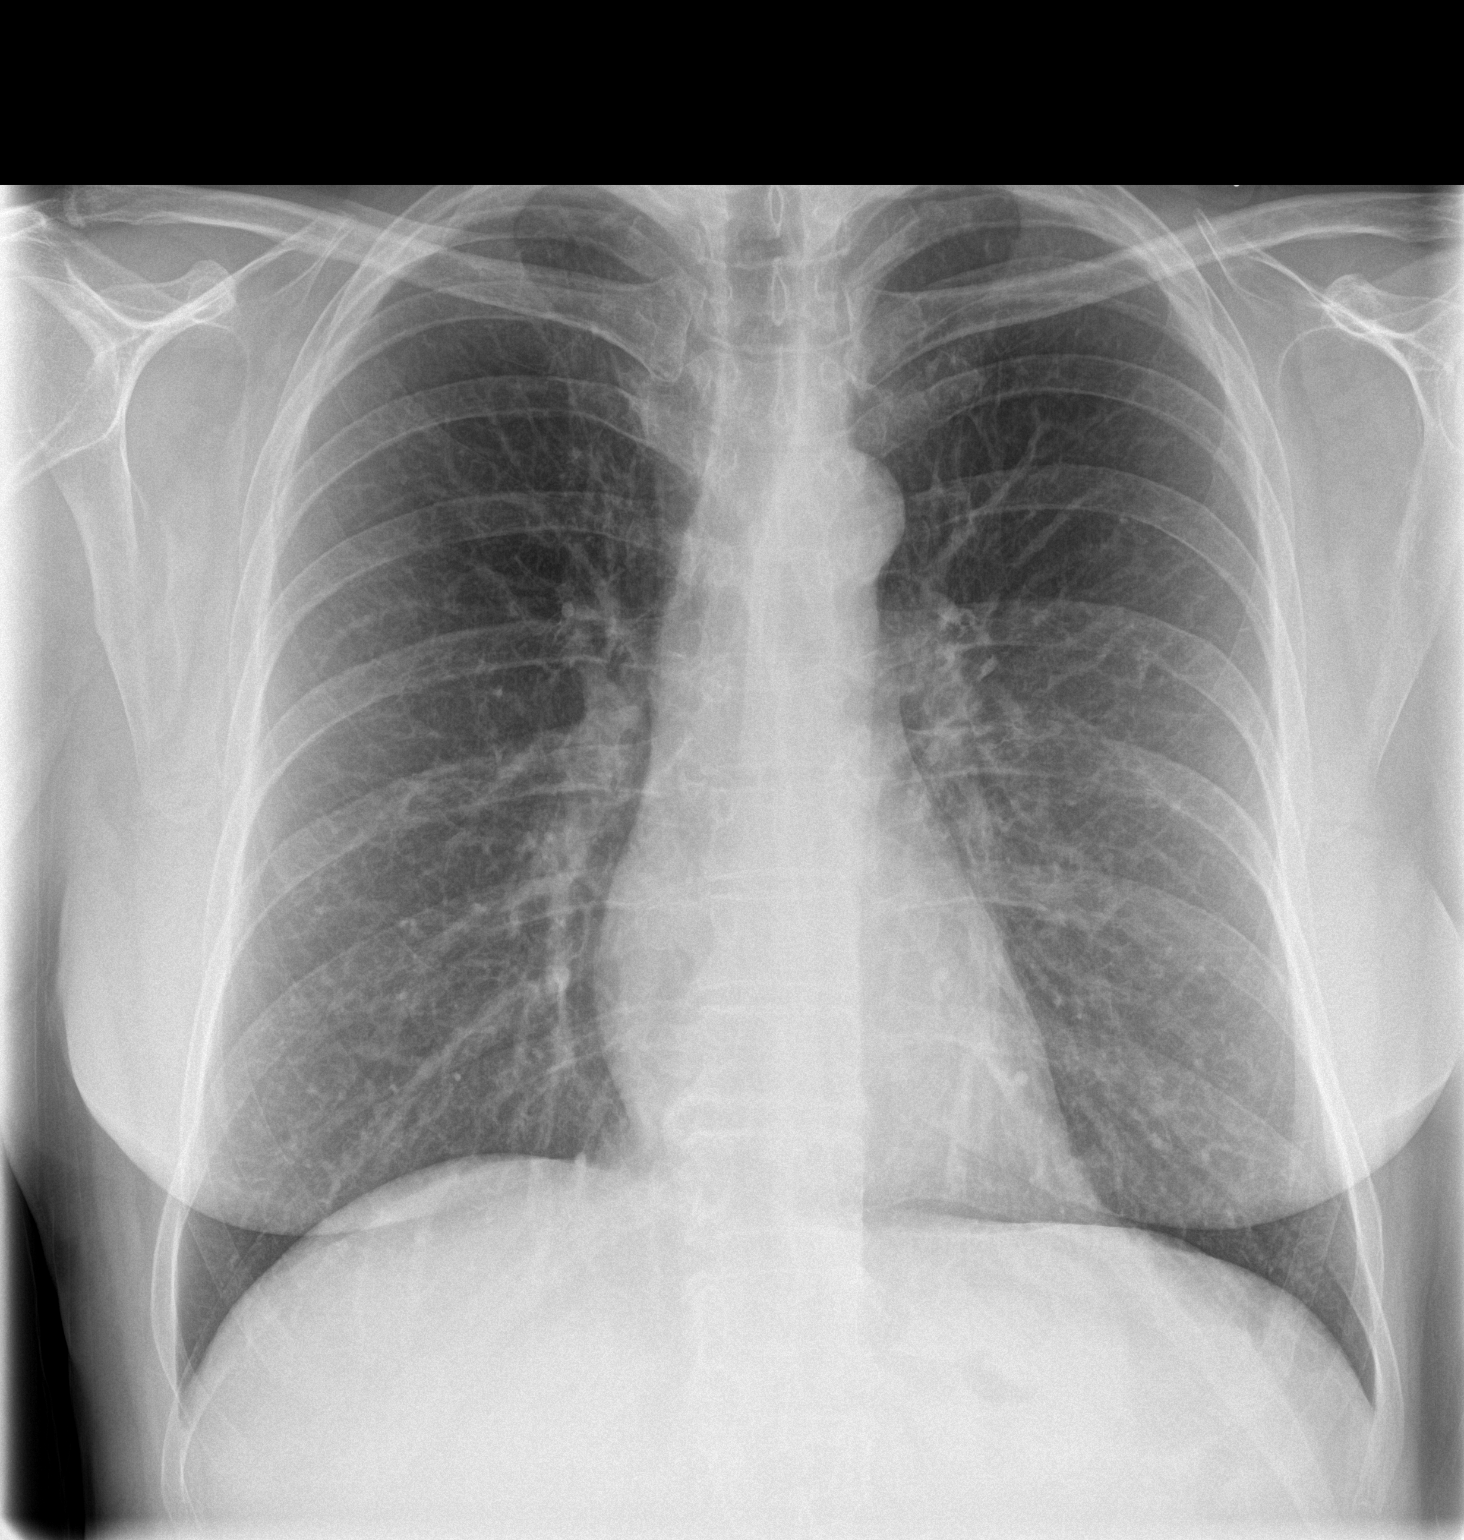
[im 2/2]
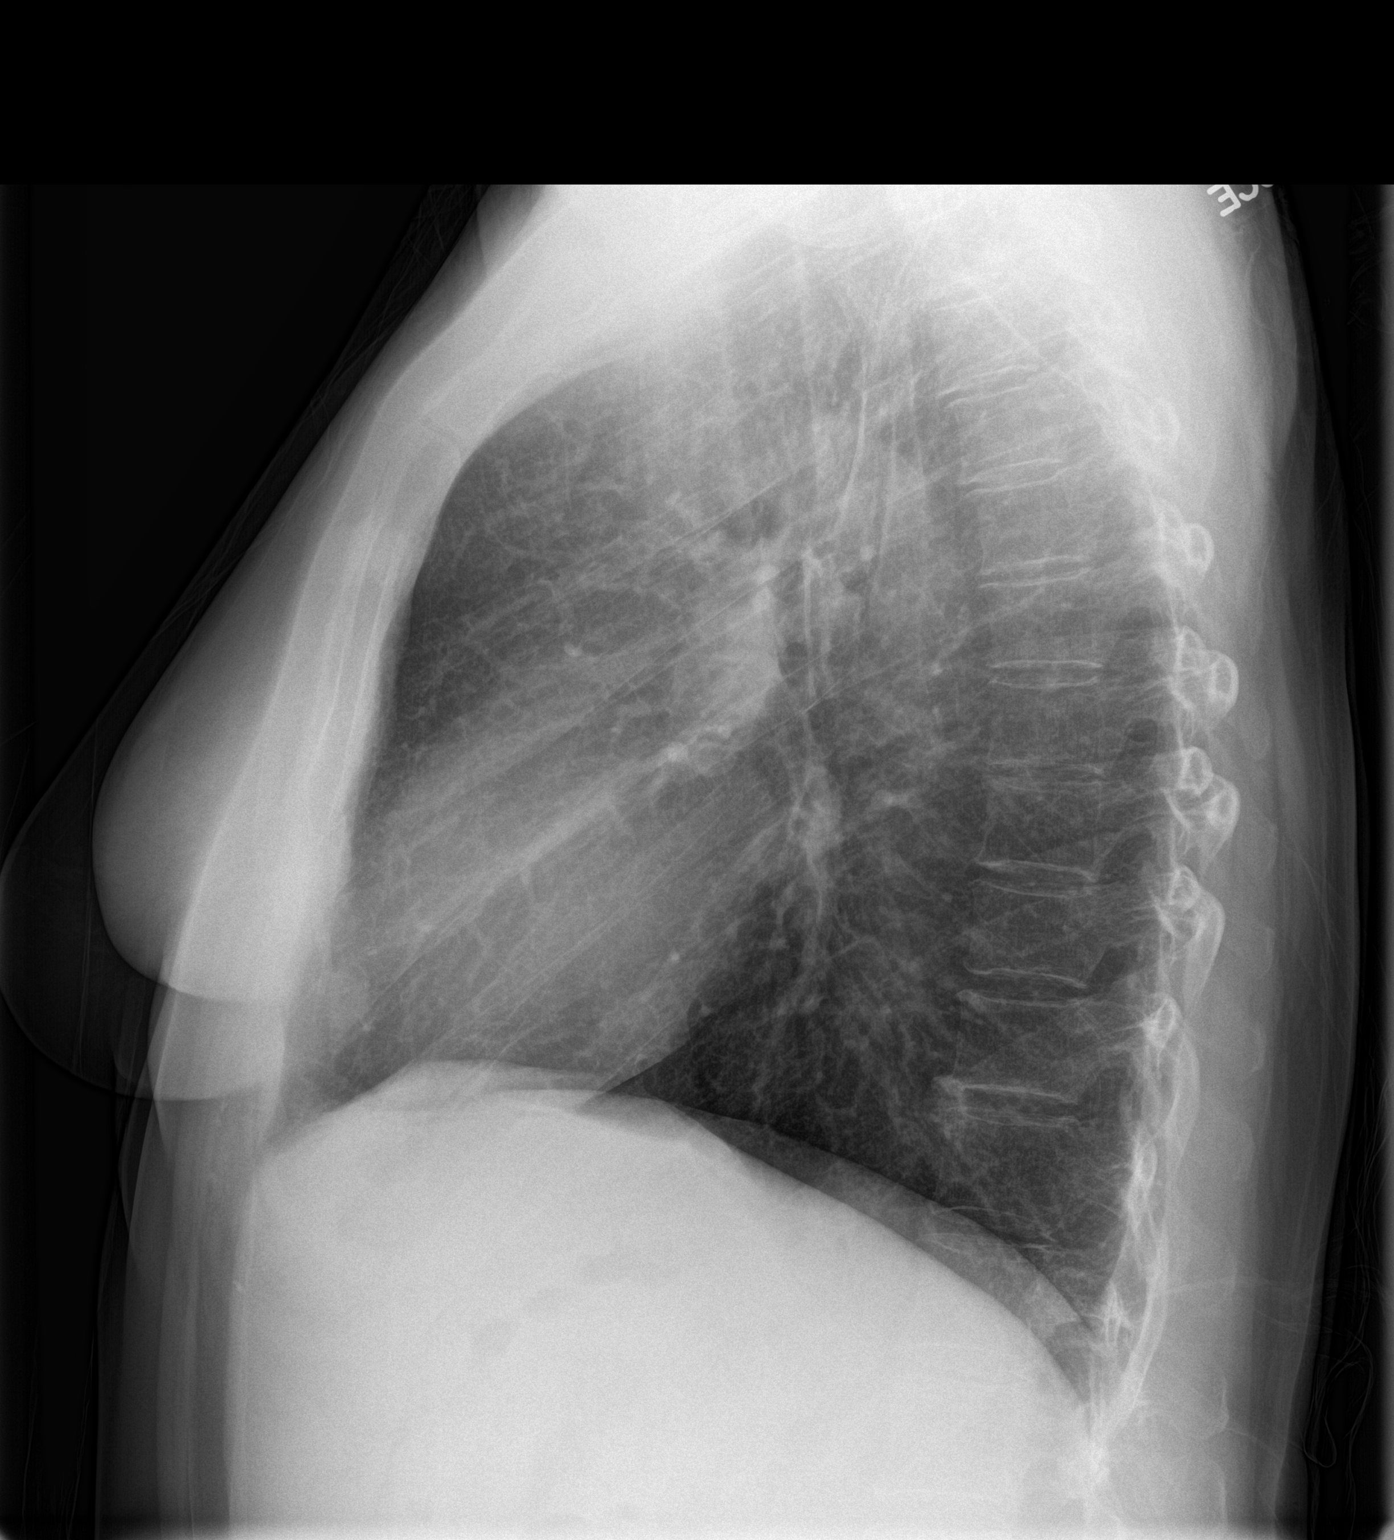

[2 of 2 positions shown; findings below may reference images not displayed]

FINDINGS: The cardiomediastinal silhouette is within normal limits. The lungs
are well inflated and clear. There is no evidence of pleural
effusion or pneumothorax. No acute osseous abnormality is
identified.
IMPRESSION: No active cardiopulmonary disease.

## 2014-11-20 MED ORDER — METHOCARBAMOL 750 MG PO TABS: 1500.0000 mg | ORAL_TABLET | Freq: Four times a day (QID) | ORAL | Status: DC

## 2014-11-20 MED ORDER — TRAMADOL HCL 50 MG PO TABS: 50.0000 mg | ORAL_TABLET | Freq: Four times a day (QID) | ORAL | Status: DC | PRN

## 2014-11-20 MED ORDER — IBUPROFEN 800 MG PO TABS: 800.0000 mg | ORAL_TABLET | Freq: Three times a day (TID) | ORAL | Status: DC | PRN

## 2014-11-20 NOTE — ED Notes (Signed)
States she developed pain to right side of chest for about 1 week.pain in the increases with movement and deep inspiration

## 2014-11-20 NOTE — Discharge Instructions (Signed)
Chest Wall Pain °Chest wall pain is pain felt in or around the chest bones and muscles. It may take up to 6 weeks to get better. It may take longer if you are active. Chest wall pain can happen on its own. Other times, things like germs, injury, coughing, or exercise can cause the pain. °HOME CARE  °· Avoid activities that make you tired or cause pain. Try not to use your chest, belly (abdominal), or side muscles. Do not use heavy weights. °· Put ice on the sore area. °¨ Put ice in a plastic bag. °¨ Place a towel between your skin and the bag. °¨ Leave the ice on for 15-20 minutes for the first 2 days. °· Only take medicine as told by your doctor. °GET HELP RIGHT AWAY IF:  °· You have more pain or are very uncomfortable. °· You have a fever. °· Your chest pain gets worse. °· You have new problems. °· You feel sick to your stomach (nauseous) or throw up (vomit). °· You start to sweat or feel lightheaded. °· You have a cough with mucus (phlegm). °· You cough up blood. °MAKE SURE YOU:  °· Understand these instructions. °· Will watch your condition. °· Will get help right away if you are not doing well or get worse. °Document Released: 11/24/2007 Document Revised: 08/30/2011 Document Reviewed: 02/01/2011 °[employer]® Patient Information ©2015 [employer]. This information is not intended to replace advice given to you by your health care provider. Make sure you discuss any questions you have with your health care provider. ° °

## 2014-11-20 NOTE — ED Notes (Signed)
Pt states that she has pain in her right breast area. She states the pain is not radiating and has been there for a week but has kept increasing. Pt takes medication for hypertension.

## 2014-11-20 NOTE — ED Provider Notes (Signed)
[facility] mergency Department Provider Note  ____________________________________________  Time seen: Approximately 8:59 AM  I have reviewed the triage vital signs and the nursing notes.   HISTORY  Chief Complaint Breast Pain    HPI Regina Carroll is a 54 y.o. female patient complained of right breast and chest wall pain for 1 week. Patient believe is status post dorsum heavy lifting at work. Patient states the pain is in the lateral breasts and increases with any movement such as overhead reaching and abduction of the right upper extremity. Patient rates the pain as 8/10 which she stated sharp with movement. Patient states she had a mammogram last year which is unremarkable she does have a history of a mass in the left breast shows discovered 2 years ago and found to be benign.   Past Medical History  Diagnosis Date  . Hypertension     There are no active problems to display for this patient.   Past Surgical History  Procedure Laterality Date  . Cesarean section Bilateral unk    No current outpatient prescriptions on file.  Allergies Review of patient's allergies indicates not on file.  No family history on file.  Social History History  Substance Use Topics  . Smoking status: Current Every Day Smoker  . Smokeless tobacco: Not on file  . Alcohol Use: Yes     Comment: occassional    Review of Systems Constitutional: No fever/chills Eyes: No visual changes. ENT: No sore throat. Cardiovascular: Denies chest pain. Respiratory: Denies shortness of breath. Gastrointestinal: No abdominal pain.  No nausea, no vomiting.  No diarrhea.  No constipation. Genitourinary: Negative for dysuria. Musculoskeletal: Right lateral chest wall pain. Right breast pain. Skin: Negative for rash. Neurological: Negative for headaches, focal weakness or numbness. Endocrine:Hypertension Hematological/Lymphatic: 10-point ROS otherwise  negative.  ____________________________________________   PHYSICAL EXAM:  VITAL SIGNS: ED Triage Vitals  Enc Vitals Group     BP 11/20/14 0804 128/66 mmHg     Pulse Rate 11/20/14 0804 73     Resp 11/20/14 0804 20     Temp 11/20/14 0804 97.9 F (36.6 C)     Temp Source 11/20/14 0804 Oral     SpO2 11/20/14 0804 97 %     Weight 11/20/14 0804 130 lb (58.968 kg)     Height 11/20/14 0804 5\' 1"  (1.549 m)     Head Cir --      Peak Flow --      Pain Score 11/20/14 0805 8     Pain Loc --      Pain Edu? --      Excl. in [place]? --     Constitutional: Alert and oriented. Well appearing and in no acute distress. Chaperoned exam Eyes: Conjunctivae are normal. PERRL. EOMI. Head: Atraumatic. Nose: No congestion/rhinnorhea. Mouth/Throat: Mucous membranes are moist.  Oropharynx non-erythematous. Neck: No stridor.  No deformity for nuchal range of motion nontender palpation. Hematological/Lymphatic/Immunilogical: No cervical lymphadenopathy. Cardiovascular: Normal rate, regular rhythm. Grossly normal heart sounds.  Good peripheral circulation. Respiratory: Normal respiratory effort.  No retractions. Lungs CTAB. Gastrointestinal: Soft and nontender. No distention. No abdominal bruits. No CVA tenderness. }Musculoskeletal: No lower extremity tenderness nor edema.  No joint effusions. Neurologic:  Normal speech and language. No gross focal neurologic deficits are appreciated. Speech is normal. No gait instability. Skin:  Skin is warm, dry and intact. No rash noted. Right breast exam reveals no dimpling no nipple discharge and no palpable lesions. Patient has some moderate guarding  with palpation bilateral breast. Psychiatric: Mood and affect are normal. Speech and behavior are normal.  ____________________________________________   LABS (all labs ordered are listed, but only abnormal results are displayed)  Labs Reviewed - No data to  display ____________________________________________  EKG   ____________________________________________  RADIOLOGY  No acute finding on chest x-ray ____________________________________________   PROCEDURES  Procedure(s) performed: None  Critical Care performed: No  ____________________________________________   INITIAL IMPRESSION / ASSESSMENT AND PLAN / ED COURSE  Pertinent labs & imaging results that were available during my care of the patient were reviewed by me and considered in my medical decision making (see chart for details).  Right breast pain ____________________________________________   FINAL CLINICAL IMPRESSION(S) / ED DIAGNOSES  Final diagnoses:  Myalgia      Sable Feil, PA-C 11/20/14 1027  Joanne Gavel, MD 11/20/14 1454

## 2014-11-20 NOTE — ED Notes (Signed)
righrt breast tender to touch, had mamorgram last year, hurts to move arm

## 2015-04-03 ENCOUNTER — Emergency Department
Admission: EM | Admit: 2015-04-03 | Discharge: 2015-04-03 | Disposition: A | Discharge status: Home / Self Care | Payer: Self-pay | Attending: Emergency Medicine | Admitting: Emergency Medicine

## 2015-04-03 ENCOUNTER — Encounter: Payer: Self-pay | Admitting: Emergency Medicine

## 2015-04-03 DIAGNOSIS — Z720 Tobacco use: Secondary | ICD-10-CM | POA: Insufficient documentation

## 2015-04-03 DIAGNOSIS — H1032 Unspecified acute conjunctivitis, left eye: Secondary | ICD-10-CM

## 2015-04-03 DIAGNOSIS — I10 Essential (primary) hypertension: Secondary | ICD-10-CM | POA: Insufficient documentation

## 2015-04-03 DIAGNOSIS — Z79899 Other long term (current) drug therapy: Secondary | ICD-10-CM | POA: Insufficient documentation

## 2015-04-03 DIAGNOSIS — Z880 Allergy status to penicillin: Secondary | ICD-10-CM | POA: Insufficient documentation

## 2015-04-03 DIAGNOSIS — H109 Unspecified conjunctivitis: Secondary | ICD-10-CM | POA: Insufficient documentation

## 2015-04-03 MED ORDER — FLUORESCEIN SODIUM 1 MG OP STRP
1.0000 | ORAL_STRIP | Freq: Once | OPHTHALMIC | Status: DC
  Filled 2015-04-03: qty 1

## 2015-04-03 MED ORDER — TOBRAMYCIN 0.3 % OP SOLN: 2.0000 [drp] | OPHTHALMIC | Status: DC

## 2015-04-03 MED ORDER — TETRACAINE HCL 0.5 % OP SOLN
1.0000 [drp] | Freq: Once | OPHTHALMIC | Status: AC
  Administered 2015-04-03: 1 [drp] via OPHTHALMIC
  Filled 2015-04-03: qty 2

## 2015-04-03 MED ORDER — EYE WASH OPHTH SOLN
1.0000 [drp] | OPHTHALMIC | Status: DC | PRN
  Filled 2015-04-03: qty 118

## 2015-04-03 NOTE — Discharge Instructions (Signed)
Wear glasses until eye is completely cleared. Use eyedrops as directed. Follow-up with [facility] as directed. Call tomorrow morning if any continued problems.

## 2015-04-03 NOTE — ED Provider Notes (Signed)
[facility] Emergency Department Provider Note  ____________________________________________  Time seen: Approximately 11:12 AM  I have reviewed the triage vital signs and the nursing notes.   HISTORY  Chief Complaint Eye Drainage   HPI Regina Carroll is a 54 y.o. female is here with complaint of left eye pain. Patient states that she currently wears contacts and has had this problem before when she had a scratch to her cornea while taking her contacts out. She has some photophobia. She denies any change in her vision other than the pain. She rates her pain as an 8 out of 10. Eye pain is constant but closing her eyes helps with the pain.   Past Medical History  Diagnosis Date  . Hypertension     There are no active problems to display for this patient.   Past Surgical History  Procedure Laterality Date  . Cesarean section Bilateral unk    Current Outpatient Rx  Name  Route  Sig  Dispense  Refill  . atenolol (TENORMIN) 25 MG tablet   Oral   Take by mouth daily.         . hydrochlorothiazide (HYDRODIURIL) 25 MG tablet   Oral   Take 25 mg by mouth daily.         Marland Kitchen ibuprofen (ADVIL,MOTRIN) 800 MG tablet   Oral   Take 1 tablet (800 mg total) by mouth every 8 (eight) hours as needed for moderate pain.   15 tablet   0   . methocarbamol (ROBAXIN-750) 750 MG tablet   Oral   Take 2 tablets (1,500 mg total) by mouth 4 (four) times daily.   40 tablet   0   . tobramycin (TOBREX) 0.3 % ophthalmic solution   Left Eye   Place 2 drops into the left eye every 4 (four) hours.   5 mL   0   . traMADol (ULTRAM) 50 MG tablet   Oral   Take 1 tablet (50 mg total) by mouth every 6 (six) hours as needed for moderate pain.   12 tablet   0     Allergies Penicillins  No family history on file.  Social History Social History  Substance Use Topics  . Smoking status: Current Every Day Smoker  . Smokeless tobacco: None  . Alcohol Use: Yes   Comment: occassional    Review of Systems Constitutional: No fever/chills Eyes: No visual changes. Left eye pain and photophobia ENT: No sore throat. Cardiovascular: Denies chest pain. Respiratory: Denies shortness of breath. Gastrointestinal:  No nausea, no vomiting.  Musculoskeletal: Negative for back pain. Skin: Negative for rash. Neurological: Negative for headaches, focal weakness or numbness.  10-point ROS otherwise negative.  ____________________________________________   PHYSICAL EXAM:  VITAL SIGNS: ED Triage Vitals  Enc Vitals Group     BP 04/03/15 1004 133/62 mmHg     Pulse Rate 04/03/15 1004 77     Resp 04/03/15 1004 16     Temp 04/03/15 1004 98 F (36.7 C)     Temp Source 04/03/15 1004 Oral     SpO2 04/03/15 1004 94 %     Weight 04/03/15 1004 128 lb (58.06 kg)     Height 04/03/15 1004 5' 1.5" (1.562 m)     Head Cir --      Peak Flow --      Pain Score 04/03/15 1003 8     Pain Loc --      Pain Edu? --  Excl. in [place]? --     Constitutional: Alert and oriented. Well appearing and in no acute distress. Eyes: Left conjunctiva with injection. PERRL. EOMI. left eyelid was inverted with no foreign body noted. Tetracaine was placed in the eye, no foreign body was noted.Fluorescein dye did uptake superiorly in the shape of her contact from 10:00 to approximately 2:00 position. Eye was irrigated with ophthalmic solution. Visual acuity was checked. Head: Atraumatic. Nose: No congestion/rhinnorhea. Neck: No stridor.   Cardiovascular: Normal rate, regular rhythm. Grossly normal heart sounds.  Good peripheral circulation. Respiratory: Normal respiratory effort.  No retractions. Lungs CTAB. Gastrointestinal: Soft and nontender. No distention. Neurologic:  Normal speech and language. No gross focal neurologic deficits are appreciated. No gait instability. Skin:  Skin is warm, dry and intact. No rash noted. Psychiatric: Mood and affect are normal. Speech and behavior  are normal.  ____________________________________________   LABS (all labs ordered are listed, but only abnormal results are displayed)  Labs Reviewed - No data to display   PROCEDURES  Procedure(s) performed: None  Critical Care performed: No  ____________________________________________   INITIAL IMPRESSION / ASSESSMENT AND PLAN / ED COURSE  Pertinent labs & imaging results that were available during my care of the patient were reviewed by me and considered in my medical decision making (see chart for details).  Conjunctivitis with possible corneal abrasion secondary to contact wearing. Patient was started on TobraDex ophthalmic solution every 4 hours while awake. Patient is to follow-up with [facility] tomorrow if any continued problems. ____________________________________________   FINAL CLINICAL IMPRESSION(S) / ED DIAGNOSES  Final diagnoses:  Conjunctivitis, acute, left eye      Johnn Hai, PA-C 04/03/15 [place], MD 04/03/15 1406

## 2015-04-03 NOTE — ED Notes (Signed)
Left eye pain and swelling since yesterday

## 2015-05-16 ENCOUNTER — Emergency Department
Admission: EM | Admit: 2015-05-16 | Discharge: 2015-05-16 | Discharge status: Against Medical Advice | Payer: Self-pay | Attending: Emergency Medicine | Admitting: Emergency Medicine

## 2015-05-16 ENCOUNTER — Encounter: Payer: Self-pay | Admitting: Emergency Medicine

## 2015-05-16 DIAGNOSIS — R42 Dizziness and giddiness: Secondary | ICD-10-CM | POA: Insufficient documentation

## 2015-05-16 DIAGNOSIS — R200 Anesthesia of skin: Secondary | ICD-10-CM | POA: Insufficient documentation

## 2015-05-16 DIAGNOSIS — F17200 Nicotine dependence, unspecified, uncomplicated: Secondary | ICD-10-CM | POA: Insufficient documentation

## 2015-05-16 DIAGNOSIS — I10 Essential (primary) hypertension: Secondary | ICD-10-CM | POA: Insufficient documentation

## 2015-05-16 DIAGNOSIS — R112 Nausea with vomiting, unspecified: Secondary | ICD-10-CM | POA: Insufficient documentation

## 2015-05-16 LAB — BASIC METABOLIC PANEL
ANION GAP: 8 (ref 5–15)
BUN: 18 mg/dL (ref 6–20)
CHLORIDE: 103 mmol/L (ref 101–111)
CO2: 26 mmol/L (ref 22–32)
Calcium: 9.7 mg/dL (ref 8.9–10.3)
Creatinine, Ser: 0.61 mg/dL (ref 0.44–1.00)
GFR calc non Af Amer: >60 mL/min (ref >60)
Glucose, Bld: 104 mg/dL — ABNORMAL HIGH (ref 65–99)
POTASSIUM: 3.4 mmol/L — AB (ref 3.5–5.1)
Sodium: 137 mmol/L (ref 135–145)

## 2015-05-16 LAB — CBC
HEMATOCRIT: 44.2 % (ref 35.0–47.0)
HEMOGLOBIN: 15.2 g/dL (ref 12.0–16.0)
MCH: 30.6 pg (ref 26.0–34.0)
MCHC: 34.4 g/dL (ref 32.0–36.0)
MCV: 89 fL (ref 80.0–100.0)
Platelets: 277 10*3/uL (ref 150–440)
RBC: 4.97 MIL/uL (ref 3.80–5.20)
RDW: 12.7 % (ref 11.5–14.5)
WBC: 8.2 10*3/uL (ref 3.6–11.0)

## 2015-05-16 LAB — URINALYSIS COMPLETE WITH MICROSCOPIC (ARMC ONLY)
Bacteria, UA: NONE SEEN
Bilirubin Urine: NEGATIVE
Glucose, UA: NEGATIVE mg/dL
HGB URINE DIPSTICK: NEGATIVE
Ketones, ur: NEGATIVE mg/dL
LEUKOCYTES UA: NEGATIVE
Nitrite: NEGATIVE
PH: 8 (ref 5.0–8.0)
PROTEIN: NEGATIVE mg/dL
Specific Gravity, Urine: 1.009 (ref 1.005–1.030)
WBC UA: NONE SEEN WBC/hpf (ref 0–5)

## 2015-05-16 LAB — TROPONIN I: Troponin I: <0.03 ng/mL (ref <0.031)

## 2015-05-16 NOTE — ED Notes (Signed)
Pt called from lobby for room assignment , no response

## 2015-05-16 NOTE — ED Notes (Signed)
Pt reports nausea and vomiting yesterday, reports waking up at 1120 with bilateral arm and leg numbness. Pt reports some dizziness, denies any headache, blurred vision.

## 2015-05-20 ENCOUNTER — Telehealth: Payer: Self-pay | Admitting: Emergency Medicine

## 2015-05-20 NOTE — ED Notes (Signed)
Called patient due to lwot to inquire about condition and follow up plans. Left message with my number. 

## 2015-06-20 ENCOUNTER — Emergency Department
Admission: EM | Admit: 2015-06-20 | Discharge: 2015-06-20 | Disposition: A | Discharge status: Home / Self Care | Payer: Self-pay | Attending: Emergency Medicine | Admitting: Emergency Medicine

## 2015-06-20 ENCOUNTER — Encounter: Payer: Self-pay | Admitting: *Deleted

## 2015-06-20 ENCOUNTER — Emergency Department: Payer: Self-pay

## 2015-06-20 DIAGNOSIS — Z880 Allergy status to penicillin: Secondary | ICD-10-CM | POA: Insufficient documentation

## 2015-06-20 DIAGNOSIS — I10 Essential (primary) hypertension: Secondary | ICD-10-CM | POA: Insufficient documentation

## 2015-06-20 DIAGNOSIS — J069 Acute upper respiratory infection, unspecified: Secondary | ICD-10-CM | POA: Insufficient documentation

## 2015-06-20 DIAGNOSIS — J40 Bronchitis, not specified as acute or chronic: Secondary | ICD-10-CM

## 2015-06-20 DIAGNOSIS — Z79899 Other long term (current) drug therapy: Secondary | ICD-10-CM | POA: Insufficient documentation

## 2015-06-20 DIAGNOSIS — F17210 Nicotine dependence, cigarettes, uncomplicated: Secondary | ICD-10-CM | POA: Insufficient documentation

## 2015-06-20 DIAGNOSIS — Z792 Long term (current) use of antibiotics: Secondary | ICD-10-CM | POA: Insufficient documentation

## 2015-06-20 DIAGNOSIS — J209 Acute bronchitis, unspecified: Secondary | ICD-10-CM | POA: Insufficient documentation

## 2015-06-20 IMAGING — CR DG CHEST 2V
2 series · 2 of 2 positions shown · non-contrast
Comparison: Chest x-rays dated [DATE] and [DATE].

CLINICAL DATA: Smoker with cough and fever for 4 days.

EXAM:
CHEST  2 VIEW

[chest pa]
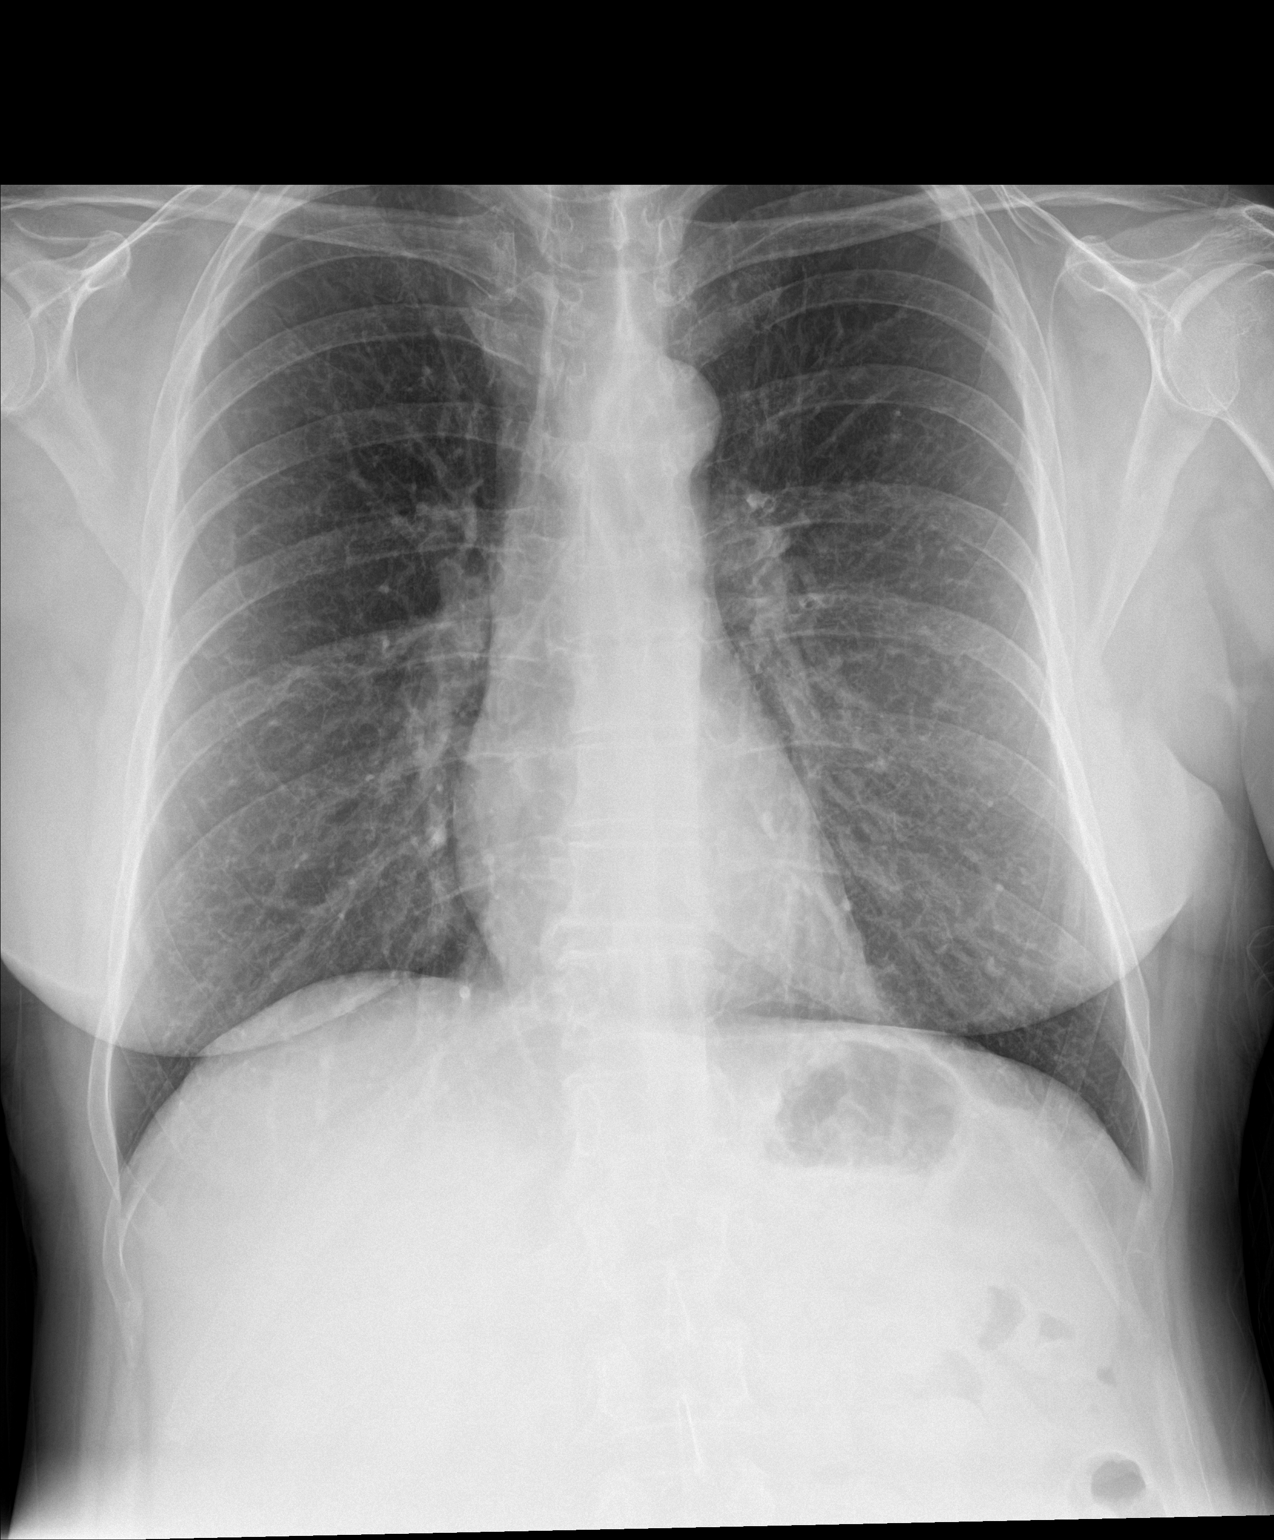

[chest lat]
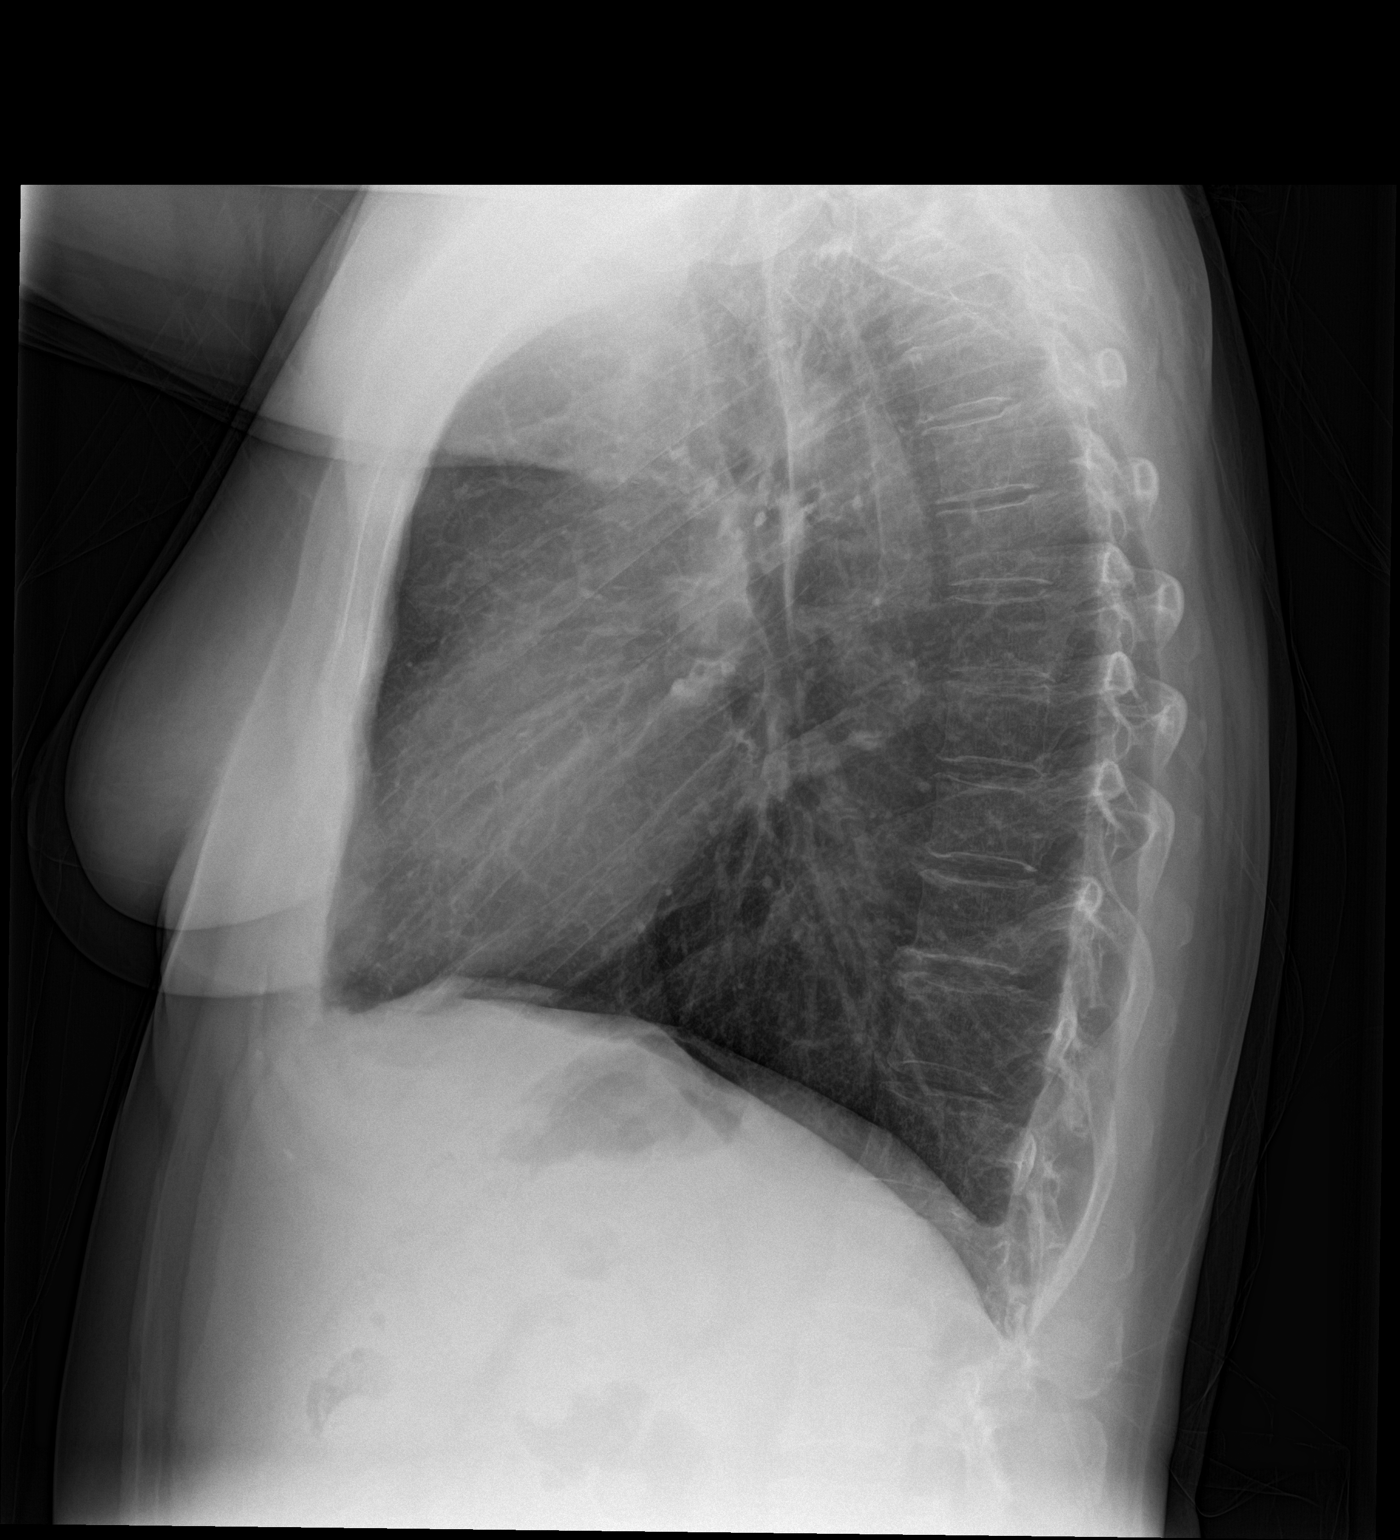

[2 of 2 positions shown; findings below may reference images not displayed]

FINDINGS: Heart size is normal. Overall cardiomediastinal silhouette is stable
in size and configuration. Pulmonary vasculature is unremarkable.
Lungs are clear. Lung volumes are upper normal. No pleural effusion
seen. No pneumothorax seen. Osseous and soft tissue structures about
the chest are unremarkable.
IMPRESSION: Stable chest x-ray. No evidence of acute cardiopulmonary
abnormality. No evidence of pneumonia.

## 2015-06-20 MED ORDER — FLUTICASONE PROPIONATE 50 MCG/ACT NA SUSP: 1.0000 | Freq: Every day | NASAL | Status: DC

## 2015-06-20 MED ORDER — AZITHROMYCIN 250 MG PO TABS: ORAL_TABLET | ORAL | Status: DC

## 2015-06-20 MED ORDER — BENZONATATE 100 MG PO CAPS: 100.0000 mg | ORAL_CAPSULE | Freq: Three times a day (TID) | ORAL | Status: DC | PRN

## 2015-06-20 MED ORDER — ALBUTEROL SULFATE HFA 108 (90 BASE) MCG/ACT IN AERS: 2.0000 | INHALATION_SPRAY | Freq: Four times a day (QID) | RESPIRATORY_TRACT | Status: AC | PRN

## 2015-06-20 MED ORDER — IPRATROPIUM-ALBUTEROL 0.5-2.5 (3) MG/3ML IN SOLN
3.0000 mL | Freq: Once | RESPIRATORY_TRACT | Status: AC
  Administered 2015-06-20: 3 mL via RESPIRATORY_TRACT
  Filled 2015-06-20: qty 3

## 2015-06-20 NOTE — ED Provider Notes (Signed)
[facility] Emergency Department Provider Note ____________________________________________  Time seen: 0910  I have reviewed the triage vital signs and the nursing notes.  HISTORY   Chief Complaint  Cough  HPI Regina Carroll is a 54 y.o. female reports to the ED for evaluation of cough, chills, sinus pressure. She describes symptoms that have been present since Tuesday, and been persistent since that time. She notes a sore throat and a productive cough. She denies any outright fevers but notes chills in the interim. She has been dosing Coricidin for symptom relief. She reports cutting back on a daily cigarette smoking secondary to her symptoms. She denies any sick contacts or recent travel.  Past Medical History  Diagnosis Date  . Hypertension     There are no active problems to display for this patient.   Past Surgical History  Procedure Laterality Date  . Cesarean section Bilateral unk  . Breast mass removed      Current Outpatient Rx  Name  Route  Sig  Dispense  Refill  . traMADol (ULTRAM) 50 MG tablet   Oral   Take 1 tablet (50 mg total) by mouth every 6 (six) hours as needed for moderate pain.   12 tablet   0   . albuterol (PROVENTIL HFA;VENTOLIN HFA) 108 (90 Base) MCG/ACT inhaler   Inhalation   Inhale 2 puffs into the lungs every 6 (six) hours as needed for wheezing or shortness of breath.   1 Inhaler   0   . atenolol (TENORMIN) 25 MG tablet   Oral   Take by mouth daily.         Marland Kitchen azithromycin (ZITHROMAX Z-PAK) 250 MG tablet      Take 2 tablets (500 mg) on  Day 1,  followed by 1 tablet (250 mg) once daily on Days 2 through 5.   6 each   0   . benzonatate (TESSALON PERLES) 100 MG capsule   Oral   Take 1 capsule (100 mg total) by mouth 3 (three) times daily as needed for cough (Take 1-2 per dose).   30 capsule   0   . fluticasone (FLONASE) 50 MCG/ACT nasal spray   Each Nare   Place 1 spray into both nostrils daily.   16 g  0   . hydrochlorothiazide (HYDRODIURIL) 25 MG tablet   Oral   Take 25 mg by mouth daily.         Marland Kitchen ibuprofen (ADVIL,MOTRIN) 800 MG tablet   Oral   Take 1 tablet (800 mg total) by mouth every 8 (eight) hours as needed for moderate pain.   15 tablet   0   . methocarbamol (ROBAXIN-750) 750 MG tablet   Oral   Take 2 tablets (1,500 mg total) by mouth 4 (four) times daily.   40 tablet   0   . tobramycin (TOBREX) 0.3 % ophthalmic solution   Left Eye   Place 2 drops into the left eye every 4 (four) hours.   5 mL   0    Allergies Penicillins  No family history on file.  Social History Social History  Substance Use Topics  . Smoking status: Current Every Day Smoker  . Smokeless tobacco: None  . Alcohol Use: Yes     Comment: occassional   Review of Systems  Constitutional: Negative for fever. Eyes: Negative for visual changes. ENT: Negative for sore throat. Cardiovascular: Negative for chest pain. Respiratory: Negative for shortness of breath. Reports cough Gastrointestinal: Negative  for abdominal pain, vomiting and diarrhea. Genitourinary: Negative for dysuria. Musculoskeletal: Negative for back pain. Skin: Negative for rash. Neurological: Negative for headaches, focal weakness or numbness. ____________________________________________  PHYSICAL EXAM:  VITAL SIGNS: ED Triage Vitals  Enc Vitals Group     BP 06/20/15 0821 146/64 mmHg     Pulse Rate 06/20/15 0821 101     Resp 06/20/15 0821 24     Temp 06/20/15 0821 98.1 F (36.7 C)     Temp Source 06/20/15 0821 Oral     SpO2 06/20/15 0821 95 %     Weight 06/20/15 0821 130 lb (58.968 kg)     Height 06/20/15 0821 5\' 1"  (1.549 m)     Head Cir --      Peak Flow --      Pain Score 06/20/15 0822 9     Pain Loc --      Pain Edu? --      Excl. in [place]? --    Constitutional: Alert and oriented. Well appearing and in no distress. Head: Normocephalic and atraumatic.      Eyes: Conjunctivae are normal. PERRL. Normal  extraocular movements      Ears: Canals clear. TMs intact bilaterally.   Nose: No congestion/rhinorrhea.   Mouth/Throat: Mucous membranes are moist.   Neck: Supple. No thyromegaly. Hematological/Lymphatic/Immunological: No cervical lymphadenopathy. Cardiovascular: Normal rate, regular rhythm.  Respiratory: Normal respiratory effort. No wheezes or rales. Bilateral rhonchi. Gastrointestinal: Soft and nontender. No distention. Musculoskeletal: Nontender with normal range of motion in all extremities.  Neurologic:  Normal gait without ataxia. Normal speech and language. No gross focal neurologic deficits are appreciated. Skin:  Skin is warm, dry and intact. No rash noted. Psychiatric: Mood and affect are normal. Patient exhibits appropriate insight and judgment. ____________________________________________   RADIOLOGY   CXR IMPRESSION: Stable chest x-ray. No evidence of acute cardiopulmonary abnormality. No evidence of pneumonia. ____________________________________________  PROCEDURES  DuoNeb x 1 ____________________________________________  INITIAL IMPRESSION / ASSESSMENT AND PLAN / ED COURSE  Patient with acute upper respiratory infection and bronchitis likely viral etiology. Chest x-ray is negative for any acute process. Patient is discharged with a prescription for Flonase, Tessalon Perles, and Z-Pak. She will follow with her primary care provider as needed for ongoing symptoms. ____________________________________________  FINAL CLINICAL IMPRESSION(S) / ED DIAGNOSES  Final diagnoses:  URI (upper respiratory infection)  Bronchitis      Melvenia Needles, PA-C 06/20/15 [place], MD 06/20/15 [phone]

## 2015-06-20 NOTE — Discharge Instructions (Signed)
Cool Mist Vaporizers °Vaporizers may help relieve the symptoms of a cough and cold. They add moisture to the air, which helps mucus to become thinner and less sticky. This makes it easier to breathe and cough up secretions. Cool mist vaporizers do not cause serious burns like hot mist vaporizers, which may also be called steamers or humidifiers. Vaporizers have not been proven to help with colds. You should not use a vaporizer if you are allergic to mold. °HOME CARE INSTRUCTIONS °· Follow the package instructions for the vaporizer. °· Do not use anything other than distilled water in the vaporizer. °· Do not run the vaporizer all of the time. This can cause mold or bacteria to grow in the vaporizer. °· Clean the vaporizer after each time it is used. °· Clean and dry the vaporizer well before storing it. °· Stop using the vaporizer if worsening respiratory symptoms develop. °  °This information is not intended to replace advice given to you by your health care provider. Make sure you discuss any questions you have with your health care provider. °  °Document Released: 03/04/2004 Document Revised: 06/12/2013 Document Reviewed: 10/25/2012 °Elsevier Interactive Patient Education ©2016 [employer] ° °Upper Respiratory Infection, Adult °Most upper respiratory infections (URIs) are a viral infection of the air passages leading to the lungs. A URI affects the nose, throat, and upper air passages. The most common type of URI is nasopharyngitis and is typically referred to as "the common cold." °URIs run their course and usually go away on their own. Most of the time, a URI does not require medical attention, but sometimes a bacterial infection in the upper airways can follow a viral infection. This is called a secondary infection. Sinus and middle ear infections are common types of secondary upper respiratory infections. °Bacterial pneumonia can also complicate a URI. A URI can worsen asthma and chronic obstructive  pulmonary disease (COPD). Sometimes, these complications can require emergency medical care and may be life threatening.  °CAUSES °Almost all URIs are caused by viruses. A virus is a type of germ and can spread from one person to another.  °RISKS FACTORS °You may be at risk for a URI if:  °· You smoke.   °· You have chronic heart or lung disease. °· You have a weakened defense (immune) system.   °· You are very young or very old.   °· You have nasal allergies or asthma. °· You work in crowded or poorly ventilated areas. °· You work in health care facilities or schools. °SIGNS AND SYMPTOMS  °Symptoms typically develop 2-3 days after you come in contact with a cold virus. Most viral URIs last 7-10 days. However, viral URIs from the influenza virus (flu virus) can last 14-18 days and are typically more severe. Symptoms may include:  °· Runny or stuffy (congested) nose.   °· Sneezing.   °· Cough.   °· Sore throat.   °· Headache.   °· Fatigue.   °· Fever.   °· Loss of appetite.   °· Pain in your forehead, behind your eyes, and over your cheekbones (sinus pain). °· Muscle aches.   °DIAGNOSIS  °Your health care provider may diagnose a URI by: °· Physical exam. °· Tests to check that your symptoms are not due to another condition such as: °¨ Strep throat. °¨ Sinusitis. °¨ Pneumonia. °¨ Asthma. °TREATMENT  °A URI goes away on its own with time. It cannot be cured with medicines, but medicines may be prescribed or recommended to relieve symptoms. Medicines may help: °· Reduce your fever. °· Reduce   your cough.  Relieve nasal congestion. HOME CARE INSTRUCTIONS   Take medicines only as directed by your health care provider.   Gargle warm saltwater or take cough drops to comfort your throat as directed by your health care provider.  Use a warm mist humidifier or inhale steam from a shower to increase air moisture. This may make it easier to breathe.  Drink enough fluid to keep your urine clear or pale yellow.   Eat  soups and other clear broths and maintain good nutrition.   Rest as needed.   Return to work when your temperature has returned to normal or as your health care provider advises. You may need to stay home longer to avoid infecting others. You can also use a face mask and careful hand washing to prevent spread of the virus.  Increase the usage of your inhaler if you have asthma.   Do not use any tobacco products, including cigarettes, chewing tobacco, or electronic cigarettes. If you need help quitting, ask your health care provider. PREVENTION  The best way to protect yourself from getting a cold is to practice good hygiene.   Avoid oral or hand contact with people with cold symptoms.   Wash your hands often if contact occurs.  There is no clear evidence that vitamin C, vitamin E, echinacea, or exercise reduces the chance of developing a cold. However, it is always recommended to get plenty of rest, exercise, and practice good nutrition.  SEEK MEDICAL CARE IF:   You are getting worse rather than better.   Your symptoms are not controlled by medicine.   You have chills.  You have worsening shortness of breath.  You have brown or red mucus.  You have yellow or brown nasal discharge.  You have pain in your face, especially when you bend forward.  You have a fever.  You have swollen neck glands.  You have pain while swallowing.  You have white areas in the back of your throat. SEEK IMMEDIATE MEDICAL CARE IF:   You have severe or persistent:  Headache.  Ear pain.  Sinus pain.  Chest pain.  You have chronic lung disease and any of the following:  Wheezing.  Prolonged cough.  Coughing up blood.  A change in your usual mucus.  You have a stiff neck.  You have changes in your:  Vision.  Hearing.  Thinking.  Mood. MAKE SURE YOU:   Understand these instructions.  Will watch your condition.  Will get help right away if you are not doing well or  get worse.   This information is not intended to replace advice given to you by your health care provider. Make sure you discuss any questions you have with your health care provider.   Document Released: 12/01/2000 Document Revised: 10/22/2014 Document Reviewed: 09/12/2013 Elsevier Interactive Patient Education 2016 [place] chest x-ray is normal, there is no sign of pneumonia. Take the prescription meds as directed.  Use a room humidifier and take OTC cough medicine (Delsym or Robitussin) for cough relief. Follow-up with Dr. Lennox Grumbles for ongoing symptoms.

## 2015-06-20 NOTE — ED Notes (Signed)
Developed cough and cold sx's since Tuesday fever/chills. Sore throat  States cough has been prod .Marland Kitchen

## 2015-06-22 DIAGNOSIS — R896 Abnormal cytological findings in specimens from other organs, systems and tissues: Secondary | ICD-10-CM | POA: Insufficient documentation

## 2015-12-10 ENCOUNTER — Ambulatory Visit
Admission: RE | Admit: 2015-12-10 | Discharge: 2015-12-10 | Disposition: A | Discharge status: Home / Self Care | Payer: Self-pay | Source: Ambulatory Visit | Attending: Internal Medicine | Admitting: Internal Medicine

## 2015-12-10 ENCOUNTER — Encounter: Payer: Self-pay | Admitting: *Deleted

## 2015-12-10 ENCOUNTER — Ambulatory Visit: Payer: Self-pay | Attending: Internal Medicine | Admitting: *Deleted

## 2015-12-10 VITALS — BP 120/73 | HR 69 | Temp 97.3°F | Ht 62.99 in | Wt 132.9 lb

## 2015-12-10 DIAGNOSIS — Z0000 Encounter for general adult medical examination without abnormal findings: Secondary | ICD-10-CM

## 2015-12-10 IMAGING — MG MM DIGITAL SCREENING BILAT W/ TOMO W/ CAD
8 of 12 series · 8 of 28 positions shown · non-contrast
Comparison: Previous exam(s).

CLINICAL DATA: Screening. History of benign left breast excisional
biopsy in [R9].

EXAM:
2D DIGITAL SCREENING BILATERAL MAMMOGRAM WITH CAD AND ADJUNCT TOMO

[R CC synth-2D]
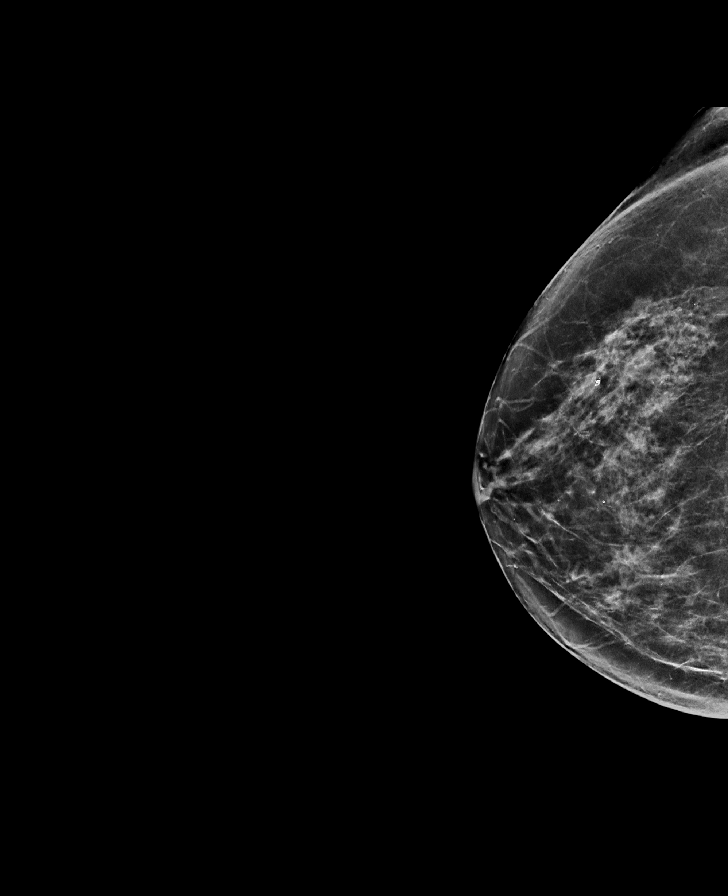

[L MLO]
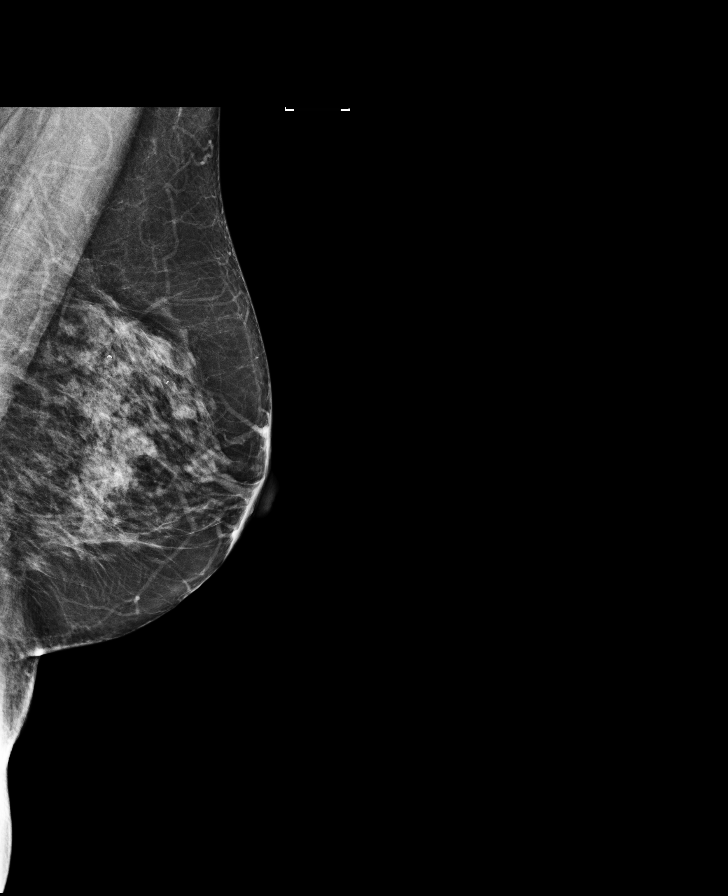

[L MLO synth-2D]
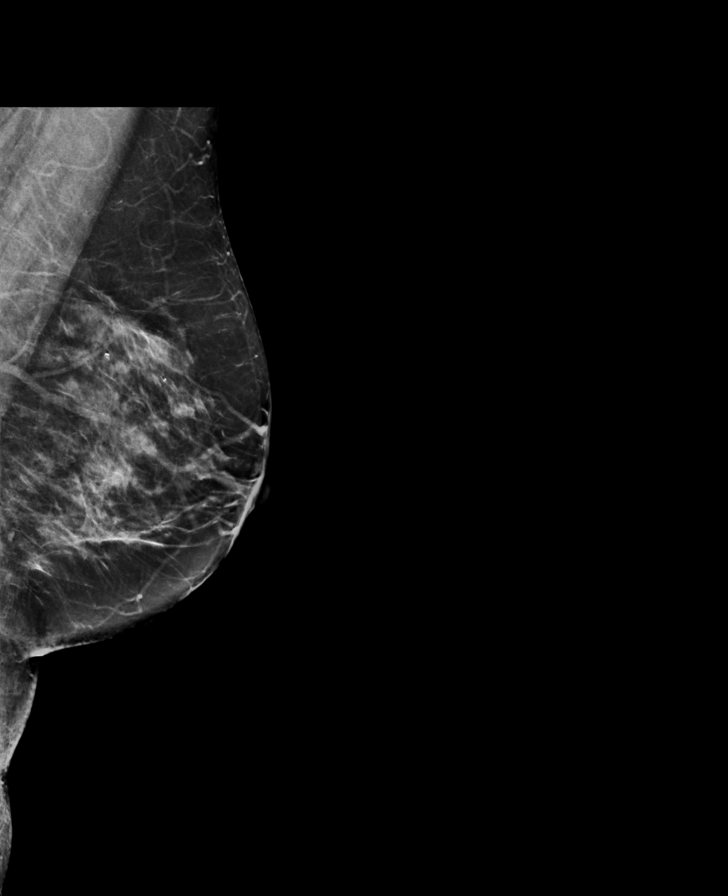

[R MLO synth-2D]
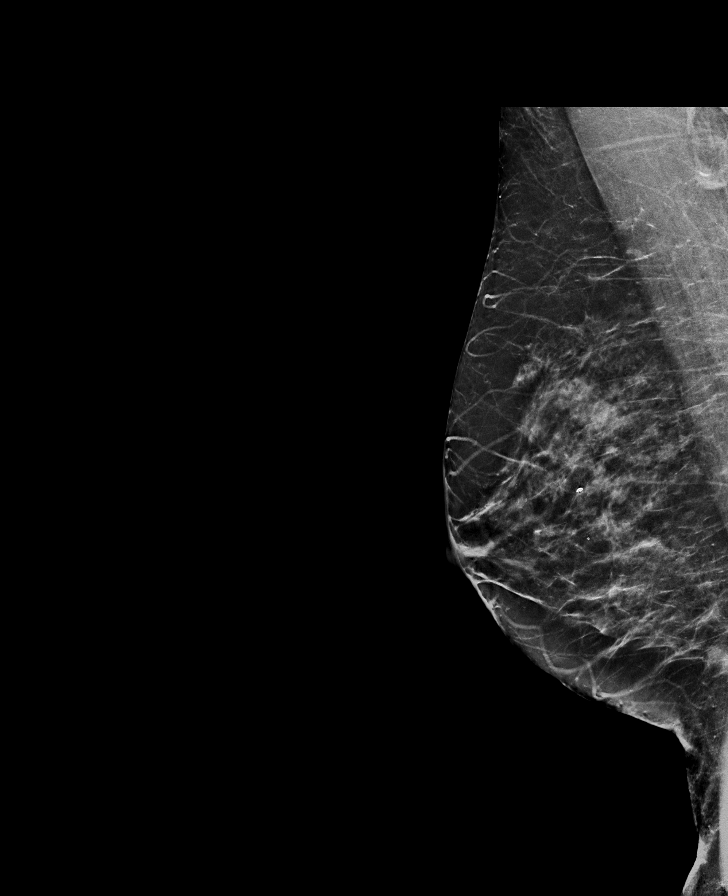

[R CC]
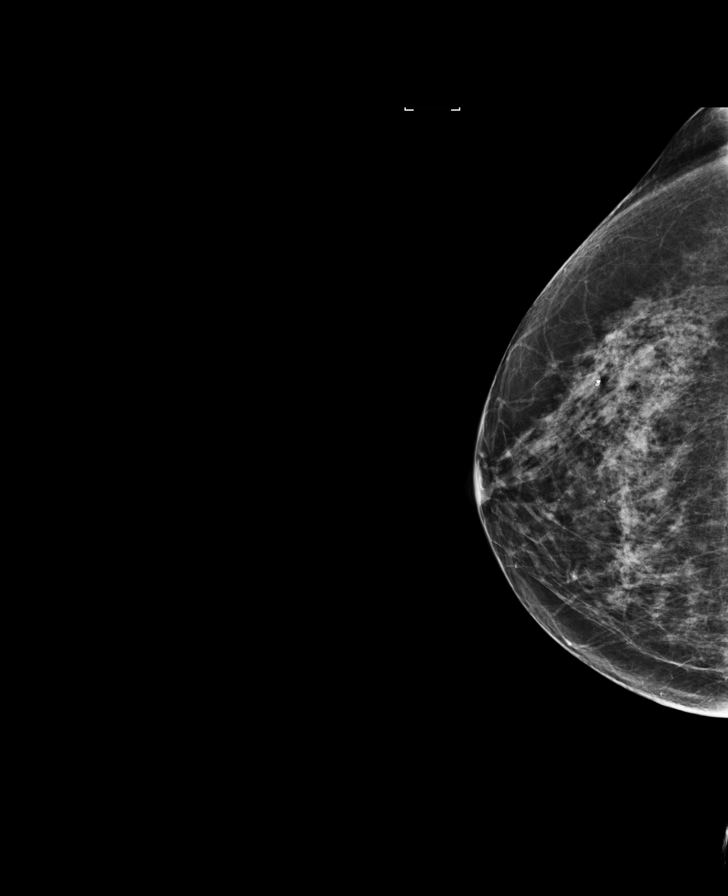

[L CC]
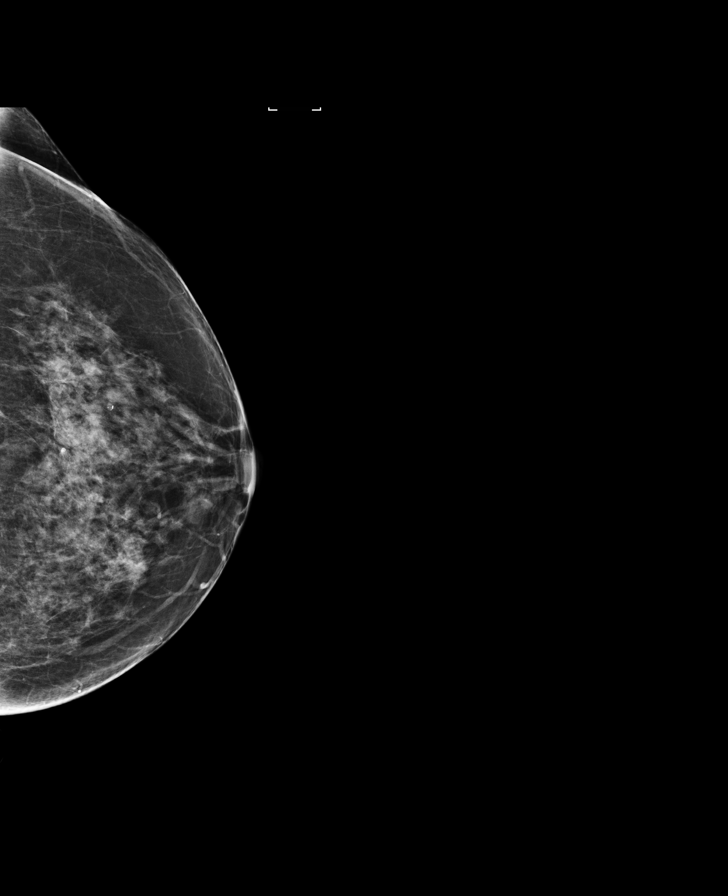

[L CC synth-2D]
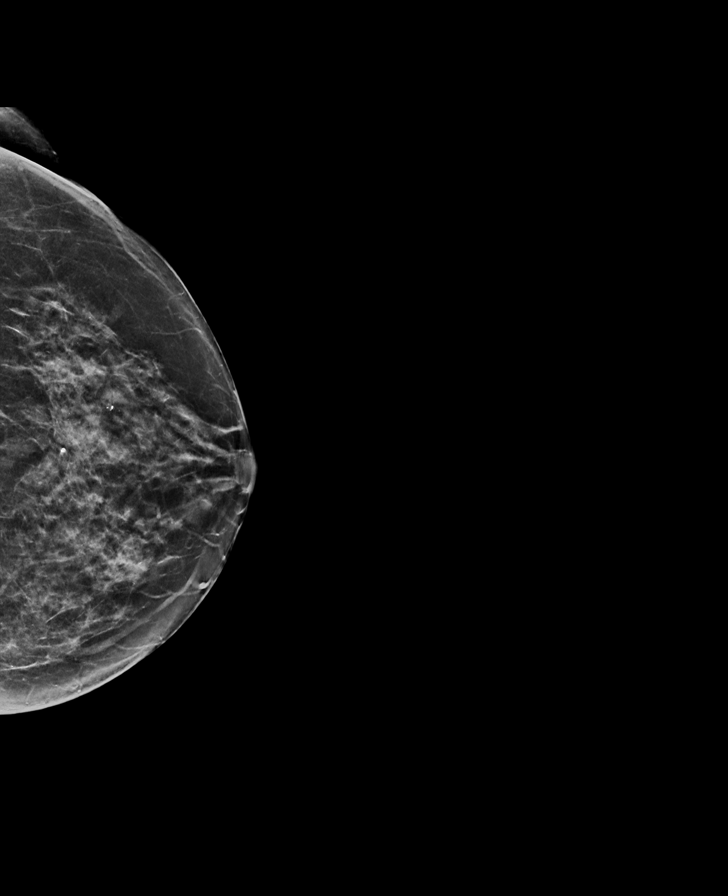

[R MLO]
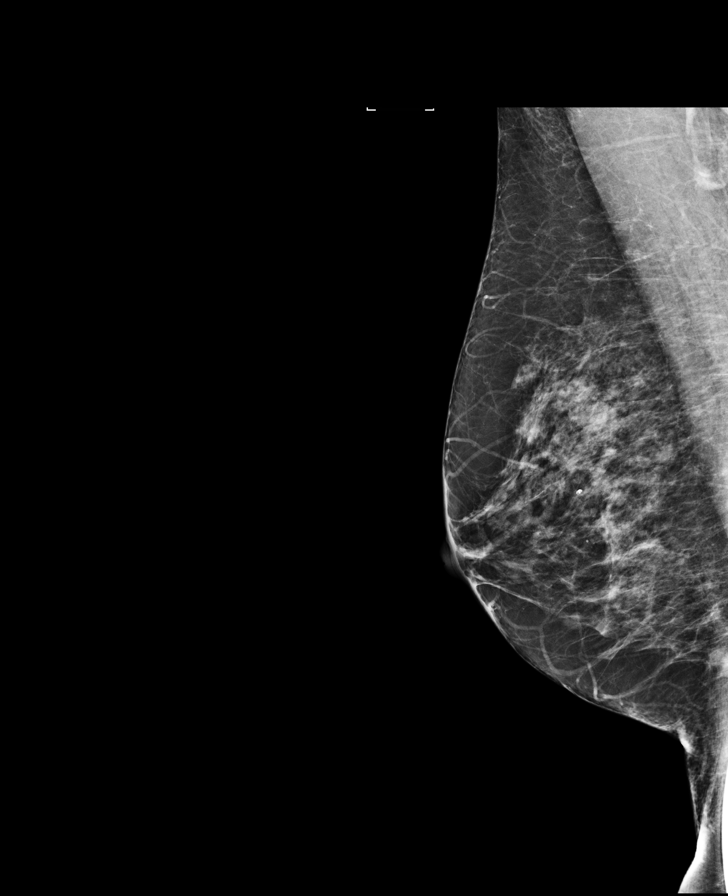

[8 of 28 positions shown; findings below may reference images not displayed]

ACR Breast Density Category c: The breast tissue is heterogeneously
dense, which may obscure small masses.
FINDINGS: There are stable postsurgical changes within the left breast. There
are no findings suspicious for malignancy within either breast.
Images were processed with CAD.
IMPRESSION: No mammographic evidence of malignancy. A result letter of this
screening mammogram will be mailed directly to the patient.

RECOMMENDATION:
Screening mammogram in one year. (Code:[R9])

BI-RADS CATEGORY  2: Benign.

## 2015-12-10 NOTE — Progress Notes (Signed)
Subjective:     Patient ID: Regina Carroll, female   DOB: Oct 09, 1960, 55 y.o.   MRN: [id number]  HPI   Review of Systems     Objective:   Physical Exam  Pulmonary/Chest: Right breast exhibits no inverted nipple, no mass, no nipple discharge, no skin change and no tenderness. Left breast exhibits no inverted nipple, no mass, no nipple discharge and no tenderness. Breasts are symmetrical.    Abdominal: There is no splenomegaly or hepatomegaly.  Genitourinary: Rectal exam shows external hemorrhoid. No labial fusion. There is no rash, tenderness, lesion or injury on the right labia. There is no rash, tenderness, lesion or injury on the left labia. Cervix exhibits no motion tenderness, no discharge and no friability. Right adnexum displays no mass, no tenderness and no fullness. Left adnexum displays no mass, no tenderness and no fullness. No erythema, tenderness or bleeding in the vagina. No foreign body around the vagina. No signs of injury around the vagina. No vaginal discharge found.       Assessment:     55 year old White female returns to [facility] for annual screening.  Clinical breast exam unremarkable.  Taught self breast awareness.  Patient unsure when her last pap was.  No pap results through [facility] or at [facility].  Specimen collected for pap smear without difficulty.  Patient has been screened for eligibility.  She does not have any insurance, Medicare or Medicaid.  She also meets financial eligibility.  Hand-out given on the Affordable Care Act.     Plan:     Screening mammogram ordered.  Specimen sent to the lab.  Will follow-up per [facility] protocol.

## 2015-12-10 NOTE — Patient Instructions (Signed)
Gave patient hand-out, Women Staying Healthy, Active and Well from [facility], with education on breast health, pap smears, heart and colon health. 

## 2015-12-13 LAB — PAP LB AND HPV HIGH-RISK
HPV, high-risk: POSITIVE — AB
PAP SMEAR COMMENT: 0

## 2016-01-01 ENCOUNTER — Encounter: Payer: Self-pay | Admitting: *Deleted

## 2016-01-01 NOTE — Patient Instructions (Signed)
Human Papillomavirus Human papillomavirus (HPV) is the most common sexually transmitted infection (STI) and is highly contagious. HPV infections cause genital warts and cancers to the outlet of the womb (cervix), birth canal (vagina), opening of the birth canal (vulva), and anus. There are over 100 types of HPV. Unless wartlike lesions are present in the throat or there are genital warts that you can see or feel, HPV usually does not cause symptoms. It is possible to be infected for long periods and pass it on to others without knowing it. CAUSES  HPV is spread from person to person through sexual contact. This includes oral, vaginal, or anal sex. RISK FACTORS  Having unprotected sex. HPV can be spread by oral, vaginal, or anal sex.  Having several sex partners.  Having a sex partner who has other sex partners.  Having or having had another sexually transmitted infection. SIGNS AND SYMPTOMS  Most people carrying HPV do not have any symptoms. If symptoms are present, symptoms may include:  Wartlike lesions in the throat (from having oral sex).  Warts in the infected skin or mucous membranes.  Genital warts that may itch, burn, or bleed.  Genital warts that may be painful or bleed during sexual intercourse. DIAGNOSIS  If wartlike lesions are present in the throat or genital warts are present, your health care provider can usually diagnose HPV by physical examination.   Genital warts are easily seen with the naked eye.  Currently, there is no FDA-approved test to detect HPV in males.  In females, a Pap test can show cells that are infected with HPV.  In females, a scope can be used to view the cervix (colposcopy). A colposcopy can be performed if the pelvic exam or Pap test is abnormal. A sample of tissue may be removed (biopsy) during the colposcopy. TREATMENT  There is no treatment for the virus itself. However, there are treatments for the health problems and symptoms HPV can cause.  Your health care provider will follow you closely after you are treated. This is because the HPV can come back and may need treatment again. Treatment of HPV may include:   Medicines, which may be injected or applied in a cream, lotion, or gel form.  Use of a probe to apply extreme cold (cryotherapy).  Application of an intense beam of light (laser treatment).  Use of a probe to apply extreme heat (electrocautery).  Surgery. HOME CARE INSTRUCTIONS   Take medicines only as directed by your health care provider.  Use over-the-counter creams for itching or irritation as directed by your health care provider.  Keep all follow-up visits as directed by your health care provider. This is important.  Do not touch or scratch the warts.  Do not treat genital warts with medicines used for treating hand warts.  Do not have sex while you are being treated.  Do not douche or use tampons during treatment of HPV.  Tell your sex partner about your infection because he or she may also need treatment.  If you become pregnant, tell your health care provider that you have had HPV. Your health care provider will monitor you closely during pregnancy to be sure your baby is safe.  After treatment, use condoms during sex to prevent future infections.  Have only one sex partner.  Have a sex partner who does not have other sex partners. PREVENTION   Talk to your health care provider about getting the HPV vaccines. These vaccines prevent some HPV infections and cancers.  It is recommended that the vaccine be given to males and females between the ages of 41 and 66 years old. It will not work if you already have HPV, and it is not recommended for pregnant women.  A Pap test is done to screen for cervical cancer in women.  The first Pap test should be done at age 15 years.  Between ages 42 and 106 years, Pap tests are repeated every 2 years.  Beginning at age 39, you are advised to have a Pap test every  3 years as long as your past 3 Pap tests have been normal.  Some women have medical problems that increase the chance of getting cervical cancer. Talk to your health care provider about these problems. It is especially important to talk to your health care provider if a new problem develops soon after your last Pap test. In these cases, your health care provider may recommend more frequent screening and Pap tests.  The above recommendations are the same for women who have or have not gotten the vaccine for HPV.  If you had a hysterectomy for a problem that was not a cancer or a condition that could lead to cancer, then you no longer need Pap tests. However, even if you no longer need a Pap test, a regular exam is a good idea to make sure no other problems are starting.   If you are between the ages of 23 and 17 years and you have had normal Pap tests going back 10 years, you no longer need Pap tests. However, even if you no longer need a Pap test, a regular exam is a good idea to make sure no other problems are starting.  If you have had past treatment for cervical cancer or a condition that could lead to cancer, you need Pap tests and screening for cancer for at least 20 years after your treatment.  If Pap tests have been discontinued, risk factors (such as a new sexual partner)need to be reassessed to determine if screening should be resumed.  Some women may need screenings more often if they are at high risk for cervical cancer. SEEK MEDICAL CARE IF:   The treated skin becomes red, swollen, or painful.  You have a fever.  You feel generally ill.  You feel lumps or pimple-like projections in and around your genital area.  You develop bleeding of the vagina or the treatment area.  You have painful sexual intercourse. MAKE SURE YOU:   Understand these instructions.  Will watch your condition.  Will get help if you are not doing well or get worse.   This information is not  intended to replace advice given to you by your health care provider. Make sure you discuss any questions you have with your health care provider.   Document Released: 08/28/2003 Document Revised: 06/28/2014 Document Reviewed: 09/12/2013 Elsevier Interactive Patient Education [employer].

## 2016-01-01 NOTE — Progress Notes (Signed)
Letter mailed to inform patient of her normal mammogram and pap smear and need to return in one year.  Appointment made for 12/15/16 @ 8:30.  [facility] to [place].

## 2016-06-21 DIAGNOSIS — F1021 Alcohol dependence, in remission: Secondary | ICD-10-CM | POA: Insufficient documentation

## 2016-06-22 ENCOUNTER — Other Ambulatory Visit: Payer: Self-pay | Admitting: Nurse Practitioner

## 2016-06-22 DIAGNOSIS — R1011 Right upper quadrant pain: Secondary | ICD-10-CM

## 2016-06-22 DIAGNOSIS — K219 Gastro-esophageal reflux disease without esophagitis: Secondary | ICD-10-CM

## 2016-06-29 ENCOUNTER — Ambulatory Visit
Admission: RE | Admit: 2016-06-29 | Discharge: 2016-06-29 | Disposition: A | Discharge status: Home / Self Care | Payer: BLUE CROSS/BLUE SHIELD | Source: Ambulatory Visit | Attending: Nurse Practitioner | Admitting: Nurse Practitioner

## 2016-06-29 DIAGNOSIS — K219 Gastro-esophageal reflux disease without esophagitis: Secondary | ICD-10-CM

## 2016-06-29 DIAGNOSIS — R1011 Right upper quadrant pain: Secondary | ICD-10-CM

## 2016-06-29 IMAGING — US US ABDOMEN LIMITED
1 series · 15 of 25 positions shown · non-contrast
Comparison: CT abdomen pelvis of [DATE]

CLINICAL DATA: Right upper quadrant abdominal pain, symptoms of
gastroesophageal reflux

EXAM:
US ABDOMEN LIMITED - RIGHT UPPER QUADRANT

[Series 2: us abdomen limited · 15 of 50 slices shown]
[im 1/50]
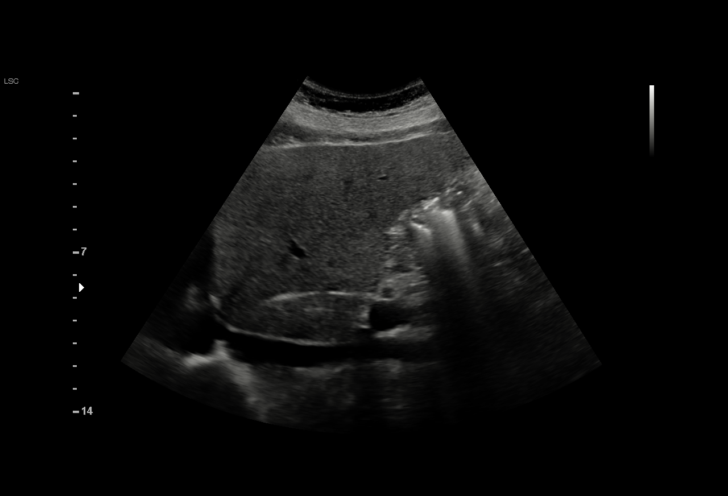
[im 5/50]
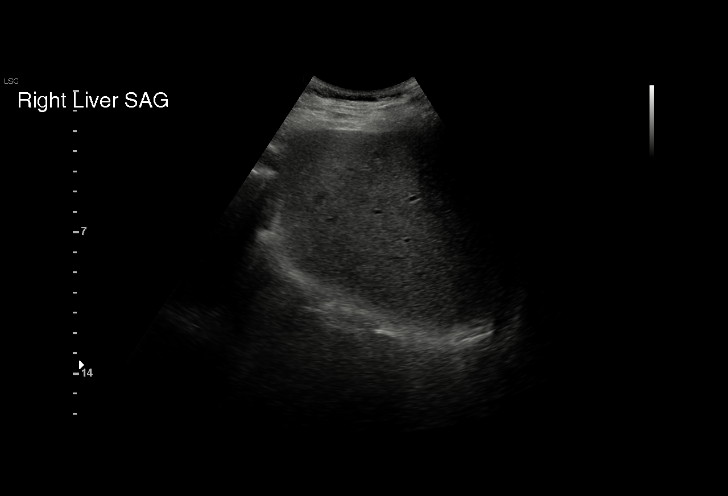
[im 9/50]
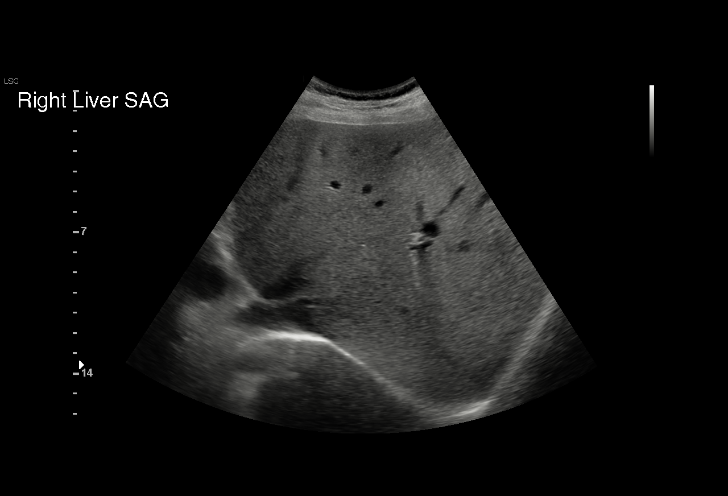
[im 11/50]
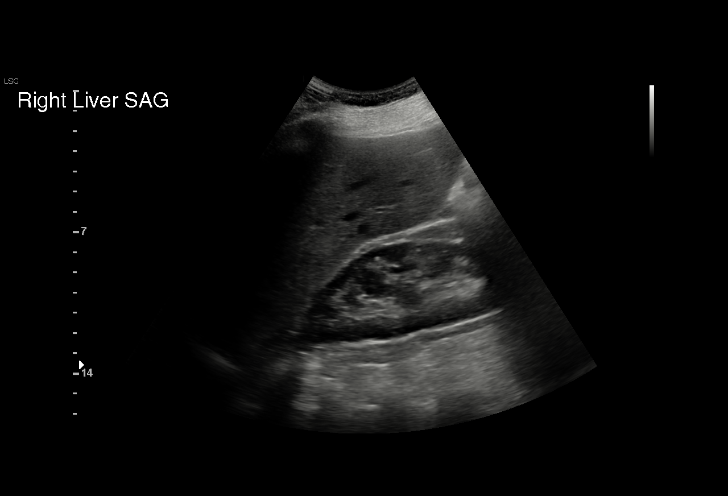
[im 15/50]
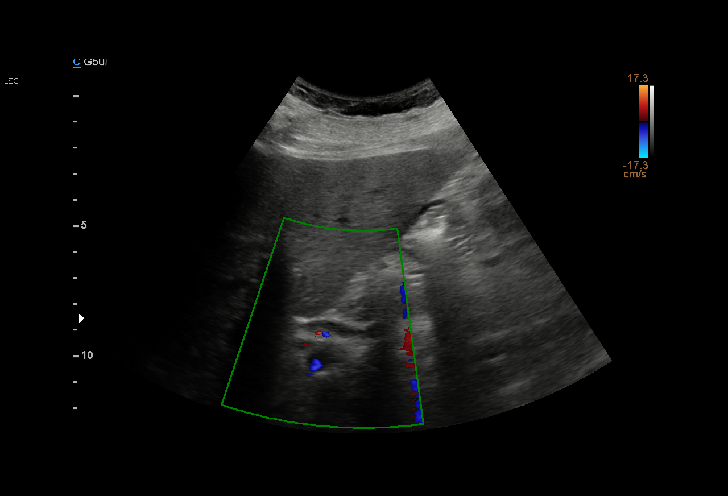
[im 19/50]
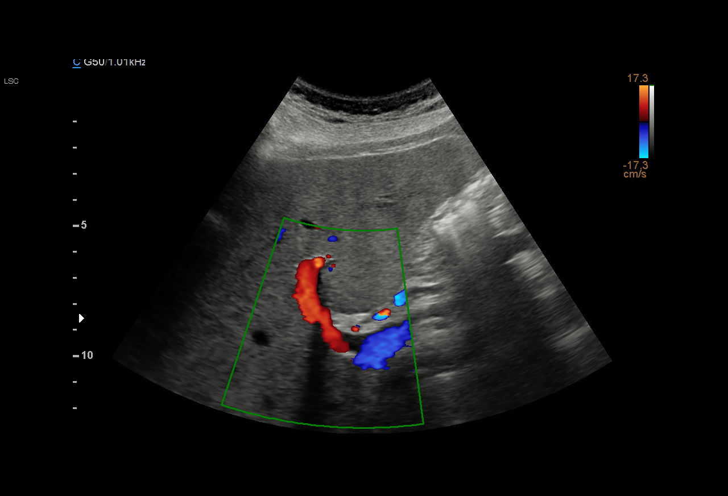
[im 21/50]
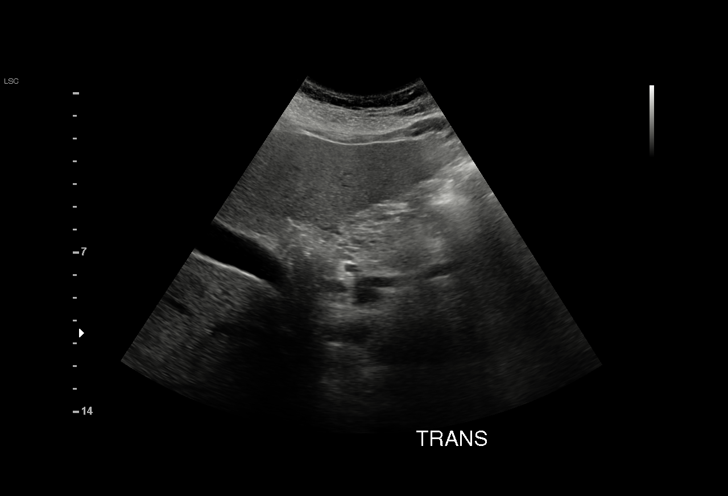
[im 25/50]
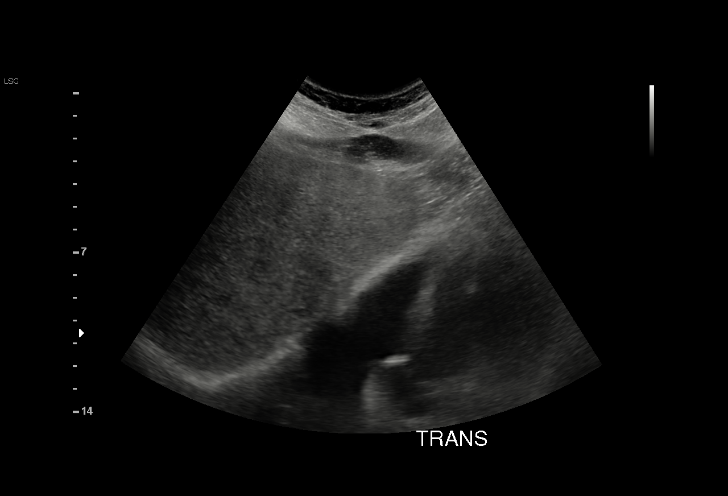
[im 29/50]
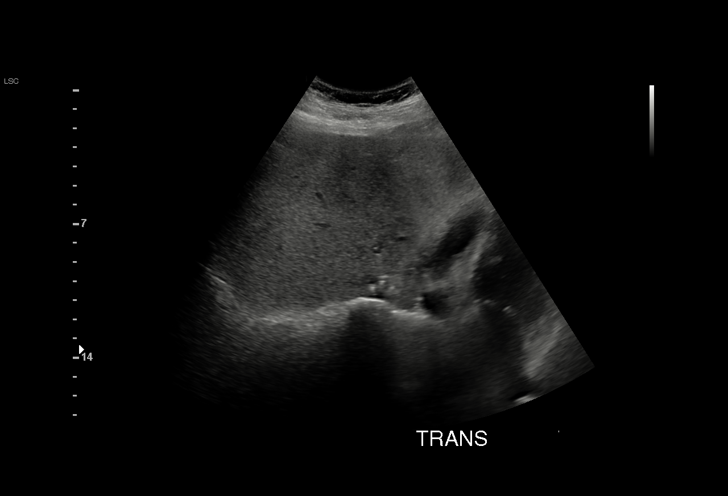
[im 31/50]
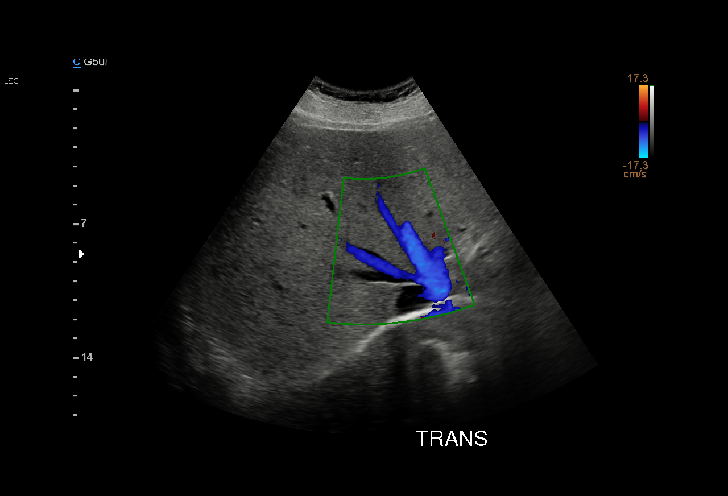
[im 35/50]
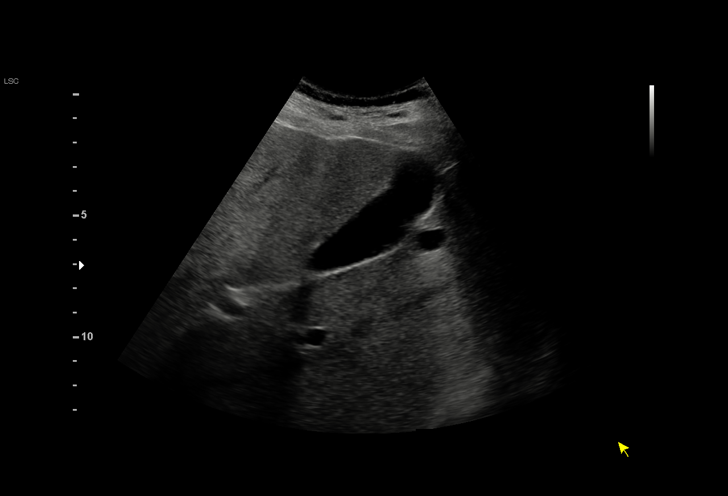
[im 39/50]
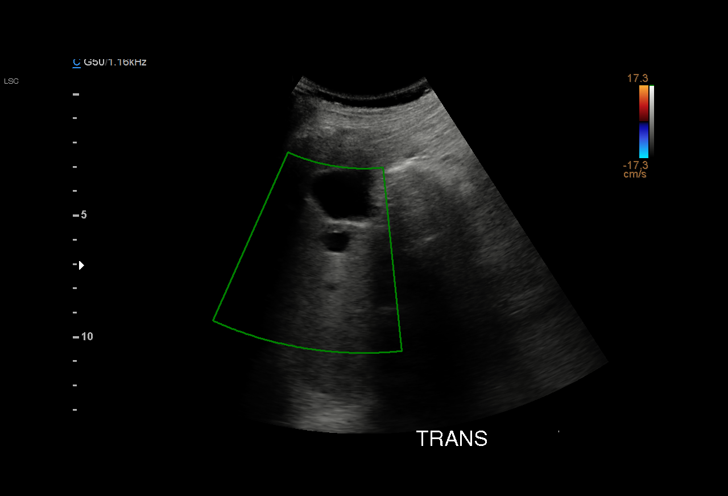
[im 41/50]
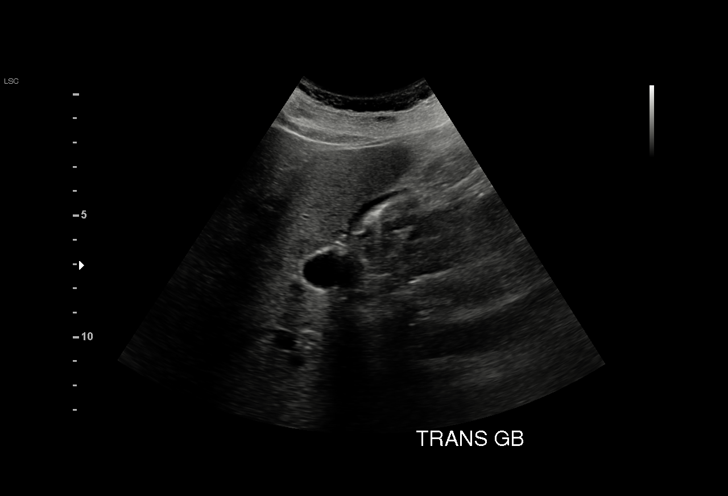
[im 45/50]
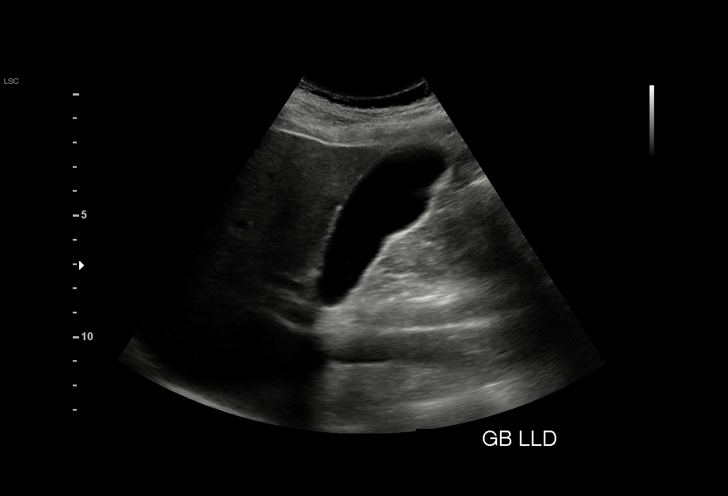
[im 50/50]
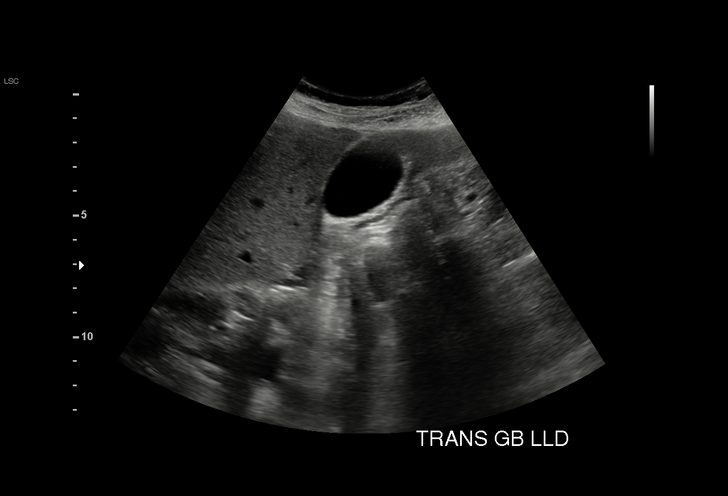

[15 of 25 positions shown; findings below may reference images not displayed]

FINDINGS: Gallbladder:

The gallbladder is visualized and no gallstones are noted. There is
no gallbladder wall thickening and no pericholecystic fluid is seen.

Common bile duct:

Diameter: The common bile duct is normal measuring 3.3 mm.

Liver:

The parenchyma of the liver is somewhat echogenic suggesting diffuse
fatty infiltration. No focal hepatic abnormality is seen. There is a
cyst in the liver measuring 1.3 cm near the gallbladder.
IMPRESSION: 1. No gallstones.
2. Slightly echogenic liver parenchyma may indicate fatty
infiltration. Correlate with LFTs.

## 2016-06-30 ENCOUNTER — Other Ambulatory Visit
Admission: RE | Admit: 2016-06-30 | Discharge: 2016-06-30 | Disposition: A | Discharge status: Home / Self Care | Payer: BLUE CROSS/BLUE SHIELD | Source: Ambulatory Visit | Attending: Gastroenterology | Admitting: Gastroenterology

## 2016-06-30 ENCOUNTER — Ambulatory Visit
Admission: RE | Admit: 2016-06-30 | Discharge: 2016-06-30 | Disposition: A | Discharge status: Home / Self Care | Payer: BLUE CROSS/BLUE SHIELD | Source: Ambulatory Visit | Attending: Nurse Practitioner | Admitting: Nurse Practitioner

## 2016-06-30 DIAGNOSIS — K760 Fatty (change of) liver, not elsewhere classified: Secondary | ICD-10-CM | POA: Insufficient documentation

## 2016-06-30 DIAGNOSIS — R197 Diarrhea, unspecified: Secondary | ICD-10-CM | POA: Diagnosis not present

## 2016-06-30 LAB — GASTROINTESTINAL PANEL BY PCR, STOOL (REPLACES STOOL CULTURE)
ASTROVIRUS: NOT DETECTED
Adenovirus F40/41: NOT DETECTED
CAMPYLOBACTER SPECIES: NOT DETECTED
CYCLOSPORA CAYETANENSIS: NOT DETECTED
Cryptosporidium: NOT DETECTED
ENTAMOEBA HISTOLYTICA: NOT DETECTED
ENTEROTOXIGENIC E COLI (ETEC): NOT DETECTED
Enteroaggregative E coli (EAEC): NOT DETECTED
Enteropathogenic E coli (EPEC): NOT DETECTED
Giardia lamblia: NOT DETECTED
NOROVIRUS GI/GII: NOT DETECTED
PLESIMONAS SHIGELLOIDES: NOT DETECTED
Rotavirus A: NOT DETECTED
SALMONELLA SPECIES: NOT DETECTED
SAPOVIRUS (I, II, IV, AND V): NOT DETECTED
SHIGA LIKE TOXIN PRODUCING E COLI (STEC): NOT DETECTED
SHIGELLA/ENTEROINVASIVE E COLI (EIEC): NOT DETECTED
VIBRIO CHOLERAE: NOT DETECTED
Vibrio species: NOT DETECTED
Yersinia enterocolitica: NOT DETECTED

## 2016-06-30 LAB — C DIFFICILE QUICK SCREEN W PCR REFLEX
C DIFFICILE (CDIFF) TOXIN: NEGATIVE
C DIFFICLE (CDIFF) ANTIGEN: POSITIVE — AB

## 2016-06-30 LAB — CLOSTRIDIUM DIFFICILE BY PCR: CDIFFPCR: POSITIVE — AB

## 2016-09-03 DIAGNOSIS — M5416 Radiculopathy, lumbar region: Secondary | ICD-10-CM | POA: Insufficient documentation

## 2016-09-03 DIAGNOSIS — M545 Low back pain, unspecified: Secondary | ICD-10-CM | POA: Insufficient documentation

## 2016-09-03 DIAGNOSIS — S63045A Dislocation of carpometacarpal joint of left thumb, initial encounter: Secondary | ICD-10-CM | POA: Diagnosis not present

## 2016-09-27 ENCOUNTER — Encounter: Payer: Self-pay | Admitting: *Deleted

## 2016-09-28 ENCOUNTER — Ambulatory Visit: Payer: BLUE CROSS/BLUE SHIELD | Admitting: Anesthesiology

## 2016-09-28 ENCOUNTER — Encounter
Admission: RE | Disposition: A | Discharge status: Home / Self Care | Payer: Self-pay | Source: Ambulatory Visit | Attending: Gastroenterology

## 2016-09-28 ENCOUNTER — Ambulatory Visit
Admission: RE | Admit: 2016-09-28 | Discharge: 2016-09-28 | Disposition: A | Discharge status: Home / Self Care | Payer: BLUE CROSS/BLUE SHIELD | Source: Ambulatory Visit | Attending: Gastroenterology | Admitting: Gastroenterology

## 2016-09-28 ENCOUNTER — Encounter: Payer: Self-pay | Admitting: *Deleted

## 2016-09-28 DIAGNOSIS — K921 Melena: Secondary | ICD-10-CM | POA: Diagnosis not present

## 2016-09-28 DIAGNOSIS — Z880 Allergy status to penicillin: Secondary | ICD-10-CM | POA: Insufficient documentation

## 2016-09-28 DIAGNOSIS — K2950 Unspecified chronic gastritis without bleeding: Secondary | ICD-10-CM | POA: Diagnosis not present

## 2016-09-28 DIAGNOSIS — Z885 Allergy status to narcotic agent status: Secondary | ICD-10-CM | POA: Insufficient documentation

## 2016-09-28 DIAGNOSIS — F4323 Adjustment disorder with mixed anxiety and depressed mood: Secondary | ICD-10-CM | POA: Diagnosis not present

## 2016-09-28 DIAGNOSIS — K3189 Other diseases of stomach and duodenum: Secondary | ICD-10-CM | POA: Insufficient documentation

## 2016-09-28 DIAGNOSIS — Z79899 Other long term (current) drug therapy: Secondary | ICD-10-CM | POA: Insufficient documentation

## 2016-09-28 DIAGNOSIS — M109 Gout, unspecified: Secondary | ICD-10-CM | POA: Diagnosis not present

## 2016-09-28 DIAGNOSIS — F329 Major depressive disorder, single episode, unspecified: Secondary | ICD-10-CM | POA: Insufficient documentation

## 2016-09-28 DIAGNOSIS — Z8619 Personal history of other infectious and parasitic diseases: Secondary | ICD-10-CM | POA: Diagnosis not present

## 2016-09-28 DIAGNOSIS — J449 Chronic obstructive pulmonary disease, unspecified: Secondary | ICD-10-CM | POA: Diagnosis not present

## 2016-09-28 DIAGNOSIS — D124 Benign neoplasm of descending colon: Secondary | ICD-10-CM | POA: Insufficient documentation

## 2016-09-28 DIAGNOSIS — D125 Benign neoplasm of sigmoid colon: Secondary | ICD-10-CM | POA: Insufficient documentation

## 2016-09-28 DIAGNOSIS — I10 Essential (primary) hypertension: Secondary | ICD-10-CM | POA: Insufficient documentation

## 2016-09-28 DIAGNOSIS — K635 Polyp of colon: Secondary | ICD-10-CM | POA: Diagnosis not present

## 2016-09-28 DIAGNOSIS — F419 Anxiety disorder, unspecified: Secondary | ICD-10-CM | POA: Insufficient documentation

## 2016-09-28 DIAGNOSIS — R197 Diarrhea, unspecified: Secondary | ICD-10-CM | POA: Diagnosis not present

## 2016-09-28 DIAGNOSIS — R1013 Epigastric pain: Secondary | ICD-10-CM | POA: Insufficient documentation

## 2016-09-28 DIAGNOSIS — K317 Polyp of stomach and duodenum: Secondary | ICD-10-CM | POA: Insufficient documentation

## 2016-09-28 DIAGNOSIS — K5790 Diverticulosis of intestine, part unspecified, without perforation or abscess without bleeding: Secondary | ICD-10-CM | POA: Diagnosis not present

## 2016-09-28 DIAGNOSIS — K640 First degree hemorrhoids: Secondary | ICD-10-CM | POA: Insufficient documentation

## 2016-09-28 DIAGNOSIS — D127 Benign neoplasm of rectosigmoid junction: Secondary | ICD-10-CM | POA: Diagnosis not present

## 2016-09-28 DIAGNOSIS — D122 Benign neoplasm of ascending colon: Secondary | ICD-10-CM | POA: Insufficient documentation

## 2016-09-28 DIAGNOSIS — D132 Benign neoplasm of duodenum: Secondary | ICD-10-CM | POA: Diagnosis not present

## 2016-09-28 DIAGNOSIS — K2960 Other gastritis without bleeding: Secondary | ICD-10-CM | POA: Insufficient documentation

## 2016-09-28 DIAGNOSIS — K5730 Diverticulosis of large intestine without perforation or abscess without bleeding: Secondary | ICD-10-CM | POA: Insufficient documentation

## 2016-09-28 HISTORY — DX: Personal history of other diseases of the female genital tract: Z87.42

## 2016-09-28 HISTORY — DX: Depression, unspecified: F32.A

## 2016-09-28 HISTORY — DX: Personal history of other infectious and parasitic diseases: Z86.19

## 2016-09-28 HISTORY — DX: Anxiety disorder, unspecified: F41.9

## 2016-09-28 HISTORY — PX: ESOPHAGOGASTRODUODENOSCOPY (EGD) WITH PROPOFOL: SHX5813

## 2016-09-28 HISTORY — PX: COLONOSCOPY WITH PROPOFOL: SHX5780

## 2016-09-28 HISTORY — DX: Unspecified abnormal cytological findings in specimens from cervix uteri: R87.619

## 2016-09-28 HISTORY — DX: Adjustment disorder with mixed anxiety and depressed mood: F43.23

## 2016-09-28 HISTORY — DX: Diarrhea, unspecified: R19.7

## 2016-09-28 HISTORY — DX: Gonococcal infection, unspecified: A54.9

## 2016-09-28 HISTORY — DX: Chronic obstructive pulmonary disease, unspecified: J44.9

## 2016-09-28 HISTORY — DX: Gout, unspecified: M10.9

## 2016-09-28 HISTORY — DX: Major depressive disorder, single episode, unspecified: F32.9

## 2016-09-28 SURGERY — ESOPHAGOGASTRODUODENOSCOPY (EGD) WITH PROPOFOL
Anesthesia: General

## 2016-09-28 MED ORDER — LIDOCAINE HCL (CARDIAC) 20 MG/ML IV SOLN
INTRAVENOUS | Status: DC | PRN
  Administered 2016-09-28: 100 mg via INTRAVENOUS

## 2016-09-28 MED ORDER — SODIUM CHLORIDE 0.9 % IV SOLN
INTRAVENOUS | Status: DC
  Administered 2016-09-28 (×2): via INTRAVENOUS

## 2016-09-28 MED ORDER — SODIUM CHLORIDE 0.9 % IV SOLN: INTRAVENOUS | Status: DC

## 2016-09-28 MED ORDER — PROPOFOL 10 MG/ML IV BOLUS
INTRAVENOUS | Status: AC
  Filled 2016-09-28: qty 20

## 2016-09-28 MED ORDER — PROPOFOL 500 MG/50ML IV EMUL
INTRAVENOUS | Status: DC | PRN
  Administered 2016-09-28: 200 ug/kg/min via INTRAVENOUS

## 2016-09-28 MED ORDER — IPRATROPIUM-ALBUTEROL 0.5-2.5 (3) MG/3ML IN SOLN
3.0000 mL | Freq: Once | RESPIRATORY_TRACT | Status: AC
  Administered 2016-09-28: 3 mL via RESPIRATORY_TRACT

## 2016-09-28 MED ORDER — PROPOFOL 500 MG/50ML IV EMUL
INTRAVENOUS | Status: AC
  Filled 2016-09-28: qty 50

## 2016-09-28 MED ORDER — MIDAZOLAM HCL 2 MG/2ML IJ SOLN
INTRAMUSCULAR | Status: AC
  Filled 2016-09-28: qty 2

## 2016-09-28 MED ORDER — FENTANYL CITRATE (PF) 100 MCG/2ML IJ SOLN
INTRAMUSCULAR | Status: DC | PRN
  Administered 2016-09-28 (×2): 25 ug via INTRAVENOUS
  Administered 2016-09-28: 50 ug via INTRAVENOUS

## 2016-09-28 MED ORDER — FENTANYL CITRATE (PF) 100 MCG/2ML IJ SOLN
INTRAMUSCULAR | Status: AC
  Filled 2016-09-28: qty 2

## 2016-09-28 MED ORDER — MIDAZOLAM HCL 2 MG/2ML IJ SOLN
INTRAMUSCULAR | Status: DC | PRN
  Administered 2016-09-28: 2 mg via INTRAVENOUS

## 2016-09-28 MED ORDER — PROPOFOL 10 MG/ML IV BOLUS
INTRAVENOUS | Status: DC | PRN
  Administered 2016-09-28: 50 mg via INTRAVENOUS

## 2016-09-28 NOTE — Transfer of Care (Signed)
Immediate Anesthesia Transfer of Care Note  Patient: Regina Carroll  Procedure(s) Performed: Procedure(s): ESOPHAGOGASTRODUODENOSCOPY (EGD) WITH PROPOFOL (N/A) COLONOSCOPY WITH PROPOFOL (N/A)  Patient Location: PACU  Anesthesia Type:General  Level of Consciousness: awake  Airway & Oxygen Therapy: Patient Spontanous Breathing and Patient connected to nasal cannula oxygen  Post-op Assessment: Report given to RN and Post -op Vital signs reviewed and stable  Post vital signs: Reviewed and stable  Last Vitals:  Vitals:   09/28/16 1005 09/28/16 1327  BP: 136/80 124/61  Pulse: 70 61  Resp: 18 15  Temp: 36.1 C 36.3 C    Last Pain:  Vitals:   09/28/16 1327  TempSrc: Tympanic         Complications: No apparent anesthesia complications

## 2016-09-28 NOTE — Op Note (Signed)
[facility] Gastroenterology Patient Name: Regina Carroll Procedure Date: 09/28/2016 11:24 AM MRN: [id number] Account #: [id number] Date of Birth: 1960/07/16 Admit Type: Outpatient Age: 56 Room: [facility] ENDO ROOM 3 Gender: Female Note Status: Finalized Procedure:            Upper GI endoscopy Indications:          Abdominal pain in the right upper quadrant, Dyspepsia Providers:            Lollie Sails, MD Referring MD:         Juluis Rainier (Referring MD) Medicines:            Monitored Anesthesia Care Complications:        No immediate complications. Procedure:            Pre-Anesthesia Assessment:                       - ASA Grade Assessment: III - A patient with severe                        systemic disease.                       After obtaining informed consent, the endoscope was                        passed under direct vision. Throughout the procedure,                        the patient's blood pressure, pulse, and oxygen                        saturations were monitored continuously. The Endoscope                        was introduced through the mouth, and advanced to the                        fourth part of duodenum. The upper GI endoscopy was                        accomplished without difficulty. The patient tolerated                        the procedure well. Findings:      The Z-line was irregular. Biopsies were taken with a cold forceps for       histology.      Patchy mild inflammation characterized by congestion (edema), erosions       and erythema was found in the gastric body and in the gastric antrum.       Biopsies were taken with a cold forceps for histology. Biopsies were       taken with a cold forceps for Helicobacter pylori testing.      The cardia and gastric fundus were normal on retroflexion.      A single 1 mm sessile polyp with no bleeding was found in the duodenal       bulb. The polyp was removed with a cold biopsy forceps.  Resection and       retrieval were complete.      Diffuse mild mucosal variance characterized by smoothness was found  in       the entire duodenum. Biopsies were taken with a cold forceps for       histology. Impression:           - Z-line irregular. Biopsied.                       - Erosive gastritis. Biopsied.                       - A single duodenal polyp. Resected and retrieved.                       - Mucosal variant in the duodenum. Biopsied. Recommendation:       - Await pathology results.                       - Perform a colonoscopy today.                       - Use Protonix (pantoprazole) 40 mg PO daily daily.                       - Return to GI clinic in 4 weeks. Procedure Code(s):    --- Professional ---                       [phone], Esophagogastroduodenoscopy, flexible, transoral;                        with biopsy, single or multiple CPT copyright 2016 American Medical Association. All rights reserved. The codes documented in this report are preliminary and upon coder review may  be revised to meet current compliance requirements. Lollie Sails, MD 09/28/2016 11:54:28 AM This report has been signed electronically. Number of Addenda: 0 Note Initiated On: 09/28/2016 11:24 AM      [facility]

## 2016-09-28 NOTE — Anesthesia Post-op Follow-up Note (Cosign Needed)
Anesthesia QCDR form completed.        

## 2016-09-28 NOTE — H&P (Signed)
Outpatient short stay form Pre-procedure 09/28/2016 11:26 AM Lollie Sails MD  Primary Physician: Gaetano Net, NP  Reason for visit:  EGD and colonoscopy  History of present illness:  Patient is a 56 year old female presenting today as above. She has had problems with dyspepsia as well as a intermittent right upper quadrant discomfort. This is very short acting. She is uncertain as to whether it is related to food issues. She also has a postprandial diarrhea with urgency. There has been some bright red blood in the stool intermittently. She has not had a colonoscopy in the past. There is no family history of colon cancer. She tolerated her prep well. She takes no aspirin or blood thinning agents. However she does take NSAID.    Current Facility-Administered Medications:  .  0.9 %  sodium chloride infusion, , Intravenous, Continuous, Lollie Sails, MD, Last Rate: 20 mL/hr at 09/28/16 1021 .  0.9 %  sodium chloride infusion, , Intravenous, Continuous, Lollie Sails, MD  Prescriptions Prior to Admission  Medication Sig Dispense Refill Last Dose  . albuterol (PROVENTIL HFA;VENTOLIN HFA) 108 (90 Base) MCG/ACT inhaler Inhale 2 puffs into the lungs every 6 (six) hours as needed for wheezing or shortness of breath. 1 Inhaler 0 Past Month at Unknown time  . atenolol (TENORMIN) 25 MG tablet Take by mouth daily.   09/27/2016 at Unknown time  . atorvastatin (LIPITOR) 20 MG tablet Take 20 mg by mouth daily.   Past Week at Unknown time  . benzonatate (TESSALON PERLES) 100 MG capsule Take 1 capsule (100 mg total) by mouth 3 (three) times daily as needed for cough (Take 1-2 per dose). 30 capsule 0 Past Month at Unknown time  . colchicine 0.6 MG tablet Take 0.6 mg by mouth daily as needed.   Past Month at Unknown time  . hydrochlorothiazide (HYDRODIURIL) 25 MG tablet Take 25 mg by mouth daily.   09/28/2016 at Unknown time  . SUMAtriptan (IMITREX) 50 MG tablet Take 50 mg by mouth every 2 (two) hours  as needed for migraine. May repeat in 2 hours if headache persists or recurs.   Past Month at Unknown time  . tiotropium (SPIRIVA) 18 MCG inhalation capsule Place 18 mcg into inhaler and inhale daily.   Past Month at Unknown time  . vitamin B-12 (CYANOCOBALAMIN) 100 MCG tablet Take by mouth daily.   Past Week at Unknown time  . azithromycin (ZITHROMAX Z-PAK) 250 MG tablet Take 2 tablets (500 mg) on  Day 1,  followed by 1 tablet (250 mg) once daily on Days 2 through 5. (Patient not taking: Reported on 09/28/2016) 6 each 0 Not Taking at Unknown time  . buPROPion (WELLBUTRIN XL) 150 MG 24 hr tablet Take 150 mg by mouth daily.   Not Taking at Unknown time  . fluticasone (FLONASE) 50 MCG/ACT nasal spray Place 1 spray into both nostrils daily. (Patient not taking: Reported on 09/28/2016) 16 g 0 Not Taking at Unknown time  . ibuprofen (ADVIL,MOTRIN) 800 MG tablet Take 1 tablet (800 mg total) by mouth every 8 (eight) hours as needed for moderate pain. 15 tablet 0   . methocarbamol (ROBAXIN-750) 750 MG tablet Take 2 tablets (1,500 mg total) by mouth 4 (four) times daily. (Patient not taking: Reported on 09/28/2016) 40 tablet 0 Not Taking at Unknown time  . tobramycin (TOBREX) 0.3 % ophthalmic solution Place 2 drops into the left eye every 4 (four) hours. (Patient not taking: Reported on 09/28/2016) 5 mL 0 Completed  Course at Unknown time  . traMADol (ULTRAM) 50 MG tablet Take 1 tablet (50 mg total) by mouth every 6 (six) hours as needed for moderate pain. (Patient not taking: Reported on 09/28/2016) 12 tablet 0 Completed Course at Unknown time     Allergies  Allergen Reactions  . Codeine   . Penicillins Rash     Past Medical History:  Diagnosis Date  . Abnormal cervical cytology   . Adjustment reaction with anxiety and depression   . Anxiety   . COPD (chronic obstructive pulmonary disease) ([place])   . Depression   . Diarrhea   . Gonorrhea   . Gout   . History of acute PID   . Hx of trichomoniasis   .  Hypertension     Review of systems:      Physical Exam    Heart and lungs: Regular rate and rhythm without rub or gallop, lungs are bilaterally clear.    HEENT: Normocephalic atraumatic eyes are anicteric    Other:     Pertinant exam for procedure: Soft nontender nondistended bowel sounds positive normoactive.    Planned proceedures: EGD, colonoscopy and indicated procedures. I have discussed the risks benefits and complications of procedures to include not limited to bleeding, infection, perforation and the risk of sedation and the patient wishes to proceed.    Lollie Sails, MD Gastroenterology 09/28/2016  11:26 AM

## 2016-09-28 NOTE — Anesthesia Preprocedure Evaluation (Signed)
Anesthesia Evaluation  Patient identified by MRN, date of birth, ID band Patient awake    Reviewed: Allergy & Precautions, H&P , NPO status , Patient's Chart, lab work & pertinent test results  History of Anesthesia Complications Negative for: history of anesthetic complications  Airway Mallampati: III  TM Distance: >3 FB Neck ROM: full    Dental  (+) Poor Dentition, Chipped, Caps   Pulmonary neg shortness of breath, COPD, Current Smoker,    Pulmonary exam normal breath sounds clear to auscultation       Cardiovascular Exercise Tolerance: Good hypertension, (-) angina(-) Past MI and (-) DOE Normal cardiovascular exam Rhythm:regular Rate:Normal     Neuro/Psych PSYCHIATRIC DISORDERS Anxiety Depression negative neurological ROS     GI/Hepatic negative GI ROS, Neg liver ROS, neg GERD  ,  Endo/Other  negative endocrine ROS  Renal/GU negative Renal ROS  negative genitourinary   Musculoskeletal   Abdominal   Peds  Hematology negative hematology ROS (+)   Anesthesia Other Findings Past Medical History: No date: Abnormal cervical cytology No date: Adjustment reaction with anxiety and depression No date: Anxiety No date: COPD (chronic obstructive pulmonary disease) (* No date: Depression No date: Diarrhea No date: Gonorrhea No date: Gout No date: History of acute PID No date: Hx of trichomoniasis No date: Hypertension  Past Surgical History: 02/17/2009: BREAST EXCISIONAL BIOPSY Left     Comment: neg No date: breast mass removed unk: CESAREAN SECTION Bilateral No date: TUBAL LIGATION  BMI    Body Mass Index:  24.56 kg/m      Reproductive/Obstetrics negative OB ROS                             Anesthesia Physical Anesthesia Plan  ASA: III  Anesthesia Plan: General   Post-op Pain Management:    Induction:   Airway Management Planned:   Additional Equipment:   Intra-op  Plan:   Post-operative Plan:   Informed Consent: I have reviewed the patients History and Physical, chart, labs and discussed the procedure including the risks, benefits and alternatives for the proposed anesthesia with the patient or authorized representative who has indicated his/her understanding and acceptance.   Dental Advisory Given  Plan Discussed with: Anesthesiologist, CRNA and Surgeon  Anesthesia Plan Comments:         Anesthesia Quick Evaluation

## 2016-09-28 NOTE — Anesthesia Procedure Notes (Signed)
Date/Time: 09/28/2016 11:35 AM Performed by: Allean Found Pre-anesthesia Checklist: Patient identified, Emergency Drugs available, Suction available, Patient being monitored and Timeout performed Patient Re-evaluated:Patient Re-evaluated prior to inductionOxygen Delivery Method: Nasal cannula Preoxygenation: Pre-oxygenation with 100% oxygen Intubation Type: IV induction Placement Confirmation: positive ETCO2

## 2016-09-28 NOTE — Op Note (Signed)
[facility] Patient Name: Regina Carroll Procedure Date: 09/28/2016 11:24 AM MRN: [id number] Account #: [id number] Date of Birth: 1960-10-19 Admit Type: Outpatient Age: 56 Room: [facility] ENDO ROOM 3 Gender: Female Note Status: Finalized Procedure:            Colonoscopy Indications:          Rectal bleeding Providers:            Lollie Sails, MD Medicines:            Monitored Anesthesia Care Complications:        No immediate complications. Procedure:            Pre-Anesthesia Assessment:                       - ASA Grade Assessment: III - A patient with severe                        systemic disease.                       After obtaining informed consent, the colonoscope was                        passed under direct vision. Throughout the procedure,                        the patient's blood pressure, pulse, and oxygen                        saturations were monitored continuously. The                        Colonoscope was introduced through the anus and                        advanced to the the cecum, identified by appendiceal                        orifice and ileocecal valve. The colonoscopy was                        performed with moderate difficulty due to poor bowel                        prep, significant looping and a tortuous colon.                        Successful completion of the procedure was aided by                        changing the patient to a supine position, changing the                        patient to a prone position, using manual pressure and                        lavage. The quality of the bowel preparation was fair. Findings:      A 3 mm polyp was found in the distal sigmoid colon. The polyp was       sessile. The  polyp was removed with a cold biopsy forceps. Resection and       retrieval were complete.      A 12 mm polyp was found in the proximal sigmoid colon. The polyp was       sessile. The polyp was  removed with a hot snare. Resection and retrieval       were complete.      A 3 mm polyp was found in the descending colon. The polyp was sessile.       The polyp was removed with a cold biopsy forceps. Resection and       retrieval were complete.      A 11 mm polyp was found in the proximal ascending colon. The polyp was       sessile. The polyp was removed with a hot snare. Resection and retrieval       were complete.      A 18 mm polyp was found in the mid ascending colon. The polyp was       sessile. The polyp was removed with a hot snare. The polyp was removed       with a lift and cut technique using a hot snare. The polyp was removed       with a lift and cut technique using a cold snare. Resection and       retrieval were complete.      Four sessile polyps were found in the descending colon. The polyps were       3 to 7 mm in size. These polyps were removed with a cold snare.       Resection and retrieval were complete.      Biopsies for histology were taken with a cold forceps from the right       colon and left colon for evaluation of microscopic colitis.      Two sessile polyps were found in the mid sigmoid colon. The polyps were       2 to 3 mm in size. These polyps were removed with a cold biopsy forceps.       Resection and retrieval were complete.      A 5 mm polyp was found in the mid sigmoid colon. The polyp was sessile.       The polyp was removed with a cold snare. Resection and retrieval were       complete.      A 7 mm polyp was found in the distal sigmoid colon. The polyp was       semi-pedunculated. The polyp was removed with a hot snare. Resection and       retrieval were complete.      A 6 mm polyp was found in the recto-sigmoid colon. The polyp was       semi-pedunculated. The polyp was removed with a hot snare. Resection and       retrieval were complete.      A few small-mouthed diverticula were found in the sigmoid colon and       descending colon.       Non-bleeding internal hemorrhoids were found during retroflexion, during       digital exam and during anoscopy. The hemorrhoids were medium-sized and       Grade I (internal hemorrhoids that do not prolapse). Impression:           - Preparation of the colon was fair.                       -  One 3 mm polyp in the distal sigmoid colon, removed                        with a cold biopsy forceps. Resected and retrieved.                       - One 12 mm polyp in the proximal sigmoid colon,                        removed with a hot snare. Resected and retrieved.                       - One 3 mm polyp in the descending colon, removed with                        a cold biopsy forceps. Resected and retrieved.                       - One 11 mm polyp in the proximal ascending colon,                        removed with a hot snare. Resected and retrieved.                       - One 18 mm polyp in the mid ascending colon, removed                        using lift and cut and a cold snare, removed with a hot                        snare and removed using lift and cut and a hot snare.                        Resected and retrieved.                       - Four 3 to 7 mm polyps in the descending colon,                        removed with a cold snare. Resected and retrieved.                       - Two 2 to 3 mm polyps in the mid sigmoid colon,                        removed with a cold biopsy forceps. Resected and                        retrieved.                       - One 5 mm polyp in the mid sigmoid colon, removed with                        a cold snare. Resected and retrieved.                       - One 7 mm  polyp in the distal sigmoid colon, removed                        with a hot snare. Resected and retrieved.                       - One 6 mm polyp at the recto-sigmoid colon, removed                        with a hot snare. Resected and retrieved.                       - Diverticulosis in  the sigmoid colon and in the                        descending colon.                       - Non-bleeding internal hemorrhoids.                       - Biopsies were taken with a cold forceps from the                        right colon and left colon for evaluation of                        microscopic colitis. Recommendation:       - Await pathology results.                       - Telephone GI clinic for pathology results in 1 week.                       - Return to GI clinic in 1 month.                       - Clear liquid diet today.                       - Use Analpram HC Cream 2.5%: Apply externally TID for                        1 month. Procedure Code(s):    --- Professional ---                       [phone], Colonoscopy, flexible; with removal of tumor(s),                        polyp(s), or other lesion(s) by snare technique                       [id number], 69, Colonoscopy, flexible; with biopsy, single                        or multiple Diagnosis Code(s):    --- Professional ---                       K64.0, First degree hemorrhoids  D12.4, Benign neoplasm of descending colon                       D12.2, Benign neoplasm of ascending colon                       D12.5, Benign neoplasm of sigmoid colon                       D12.7, Benign neoplasm of rectosigmoid junction                       K62.5, Hemorrhage of anus and rectum                       K57.30, Diverticulosis of large intestine without                        perforation or abscess without bleeding CPT copyright 2016 American Medical Association. All rights reserved. The codes documented in this report are preliminary and upon coder review may  be revised to meet current compliance requirements. Lollie Sails, MD 09/28/2016 1:30:54 PM This report has been signed electronically. Number of Addenda: 0 Note Initiated On: 09/28/2016 11:24 AM Scope Withdrawal Time: 1 hour 2 minutes 18 seconds  Total  Procedure Duration: 1 hour 21 minutes 55 seconds       [facility]

## 2016-09-30 LAB — SURGICAL PATHOLOGY

## 2016-09-30 NOTE — Anesthesia Postprocedure Evaluation (Signed)
Anesthesia Post Note  Patient: Regina Carroll  Procedure(s) Performed: Procedure(s) (LRB): ESOPHAGOGASTRODUODENOSCOPY (EGD) WITH PROPOFOL (N/A) COLONOSCOPY WITH PROPOFOL (N/A)  Patient location during evaluation: Endoscopy Anesthesia Type: General Level of consciousness: awake and alert Pain management: pain level controlled Vital Signs Assessment: post-procedure vital signs reviewed and stable Respiratory status: spontaneous breathing, nonlabored ventilation, respiratory function stable and patient connected to nasal cannula oxygen Cardiovascular status: blood pressure returned to baseline and stable Postop Assessment: no signs of nausea or vomiting Anesthetic complications: no     Last Vitals:  Vitals:   09/28/16 1347 09/28/16 1357  BP: 129/75 114/66  Pulse: (!) 54 (!) 53  Resp: 17 18  Temp:      Last Pain:  Vitals:   09/28/16 1327  TempSrc: Tympanic                 Precious Haws Kenna Kirn

## 2016-11-09 ENCOUNTER — Other Ambulatory Visit: Payer: Self-pay | Admitting: Gastroenterology

## 2016-11-09 DIAGNOSIS — K210 Gastro-esophageal reflux disease with esophagitis: Secondary | ICD-10-CM | POA: Diagnosis not present

## 2016-11-09 DIAGNOSIS — R1031 Right lower quadrant pain: Secondary | ICD-10-CM | POA: Diagnosis not present

## 2016-11-09 DIAGNOSIS — R1032 Left lower quadrant pain: Secondary | ICD-10-CM | POA: Diagnosis not present

## 2016-11-09 DIAGNOSIS — R197 Diarrhea, unspecified: Secondary | ICD-10-CM | POA: Diagnosis not present

## 2016-11-19 ENCOUNTER — Ambulatory Visit
Admission: RE | Admit: 2016-11-19 | Discharge: 2016-11-19 | Disposition: A | Discharge status: Home / Self Care | Payer: BLUE CROSS/BLUE SHIELD | Source: Ambulatory Visit | Attending: Gastroenterology | Admitting: Gastroenterology

## 2016-11-19 DIAGNOSIS — I700 Atherosclerosis of aorta: Secondary | ICD-10-CM | POA: Diagnosis not present

## 2016-11-19 DIAGNOSIS — R1032 Left lower quadrant pain: Secondary | ICD-10-CM | POA: Diagnosis not present

## 2016-11-19 DIAGNOSIS — R197 Diarrhea, unspecified: Secondary | ICD-10-CM | POA: Diagnosis not present

## 2016-11-19 DIAGNOSIS — K760 Fatty (change of) liver, not elsewhere classified: Secondary | ICD-10-CM | POA: Insufficient documentation

## 2016-11-19 DIAGNOSIS — R1031 Right lower quadrant pain: Secondary | ICD-10-CM | POA: Insufficient documentation

## 2016-11-19 IMAGING — CT CT ABD-PELV W/ CM
1 of 3 series · 14 of 32 positions shown, 19 images · IV contrast (APPLIED)
Comparison: [DATE]

CLINICAL DATA: Recurrent right lower quadrant pain, diarrhea, and
nausea for approximately 3 years.

EXAM:
CT ABDOMEN AND PELVIS WITH CONTRAST
TECHNIQUE: Multidetector CT imaging of the abdomen and pelvis was performed
using the standard protocol following bolus administration of
intravenous contrast.
CONTRAST:  85mL [S8] IOPAMIDOL ([S8]) INJECTION 61%

[Series 2: axial st · axial · 0.70mm/px · z∈[-1057,-672]mm · 14 of 87 slices shown, 19 images]
[im 5/87  soft-tissue]
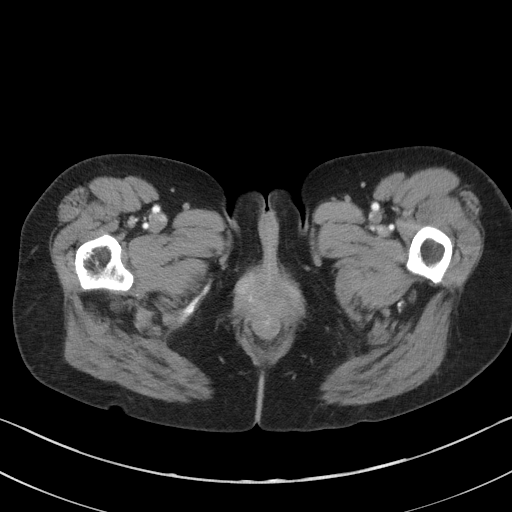
[im 5/87  bone]
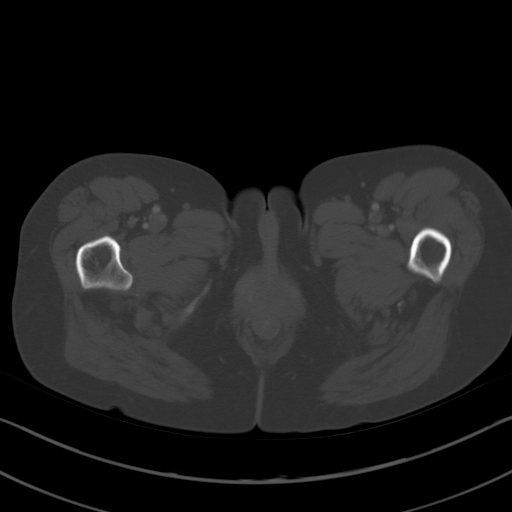
[im 14/87  soft-tissue]
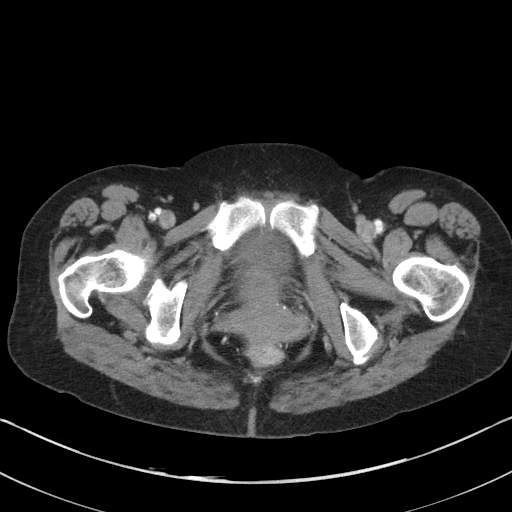
[im 19/87  soft-tissue]
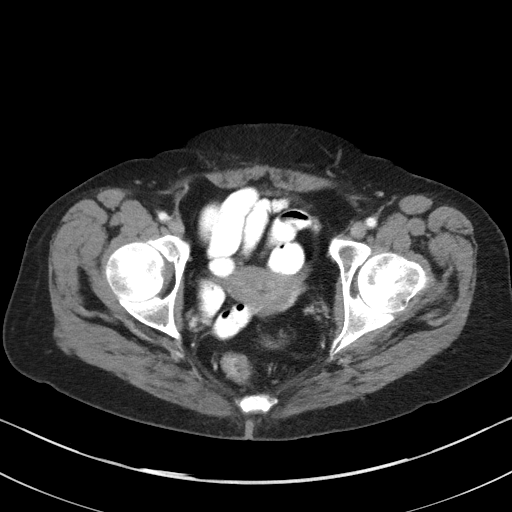
[im 23/87  soft-tissue]
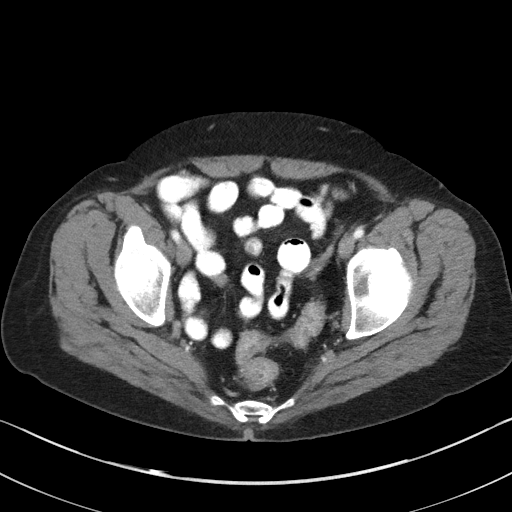
[im 32/87  soft-tissue]
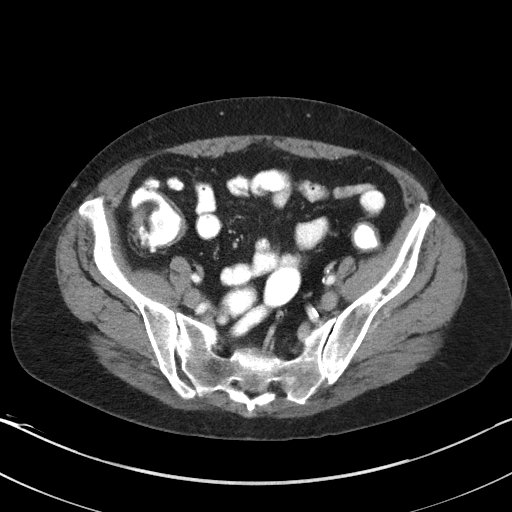
[im 37/87  soft-tissue]
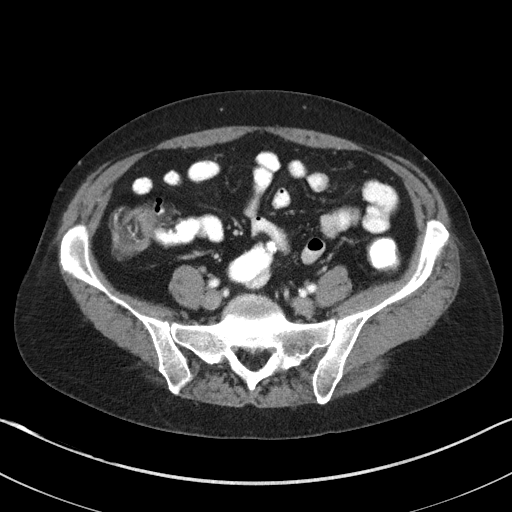
[im 46/87  soft-tissue]
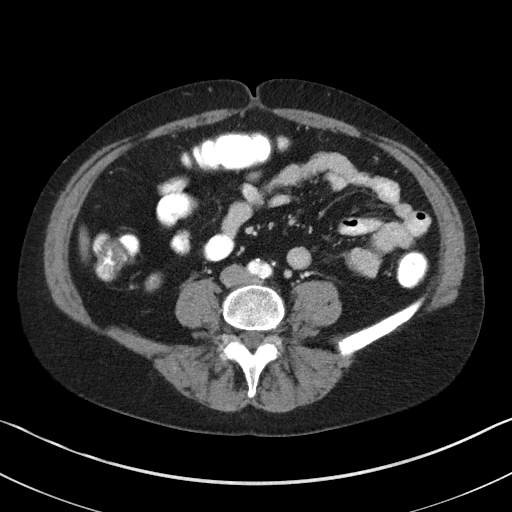
[im 50/87  soft-tissue]
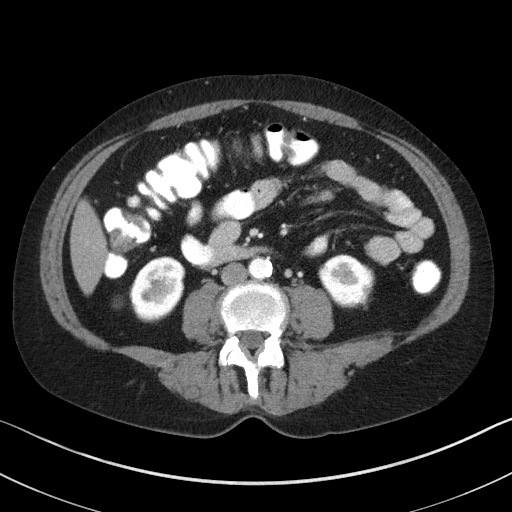
[im 55/87  soft-tissue]
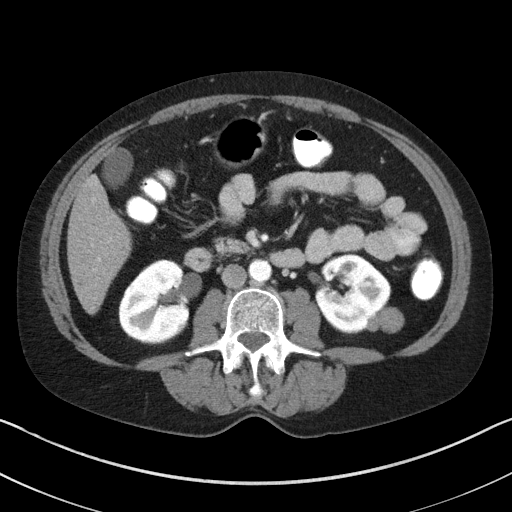
[im 55/87  bone]
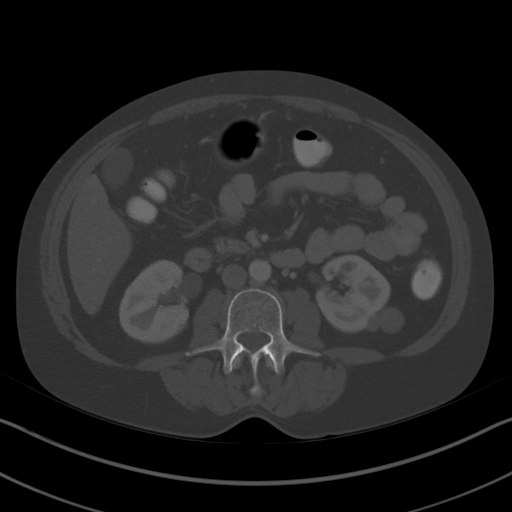
[im 64/87  soft-tissue]
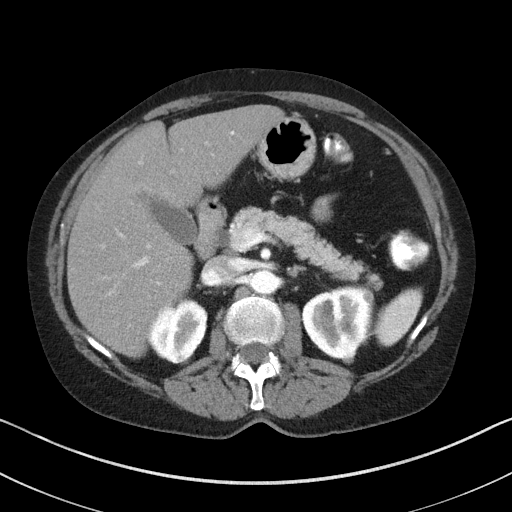
[im 68/87  soft-tissue]
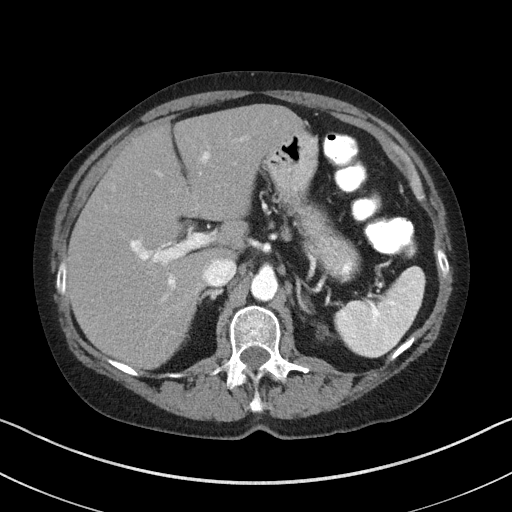
[im 68/87  lung]
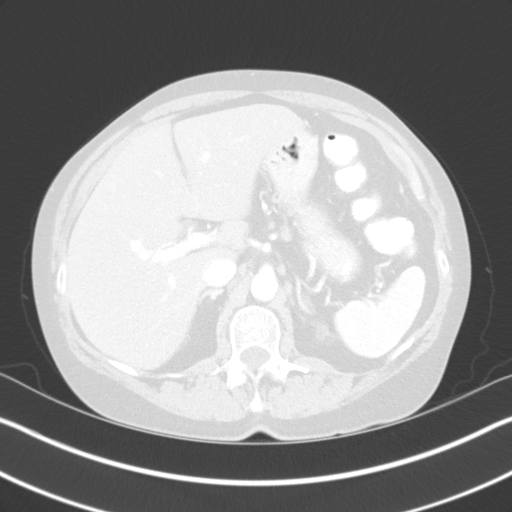
[im 73/87  soft-tissue]
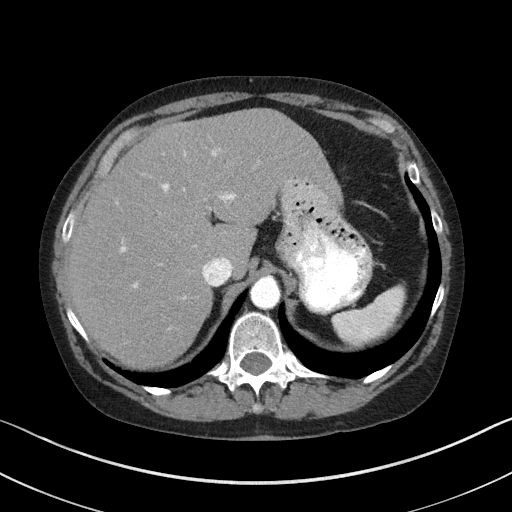
[im 73/87  lung]
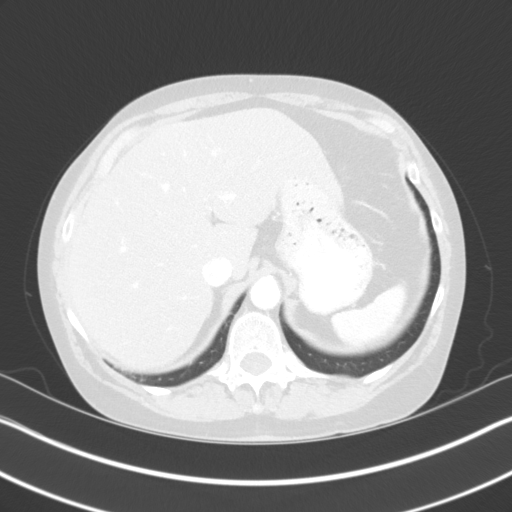
[im 77/87  lung]
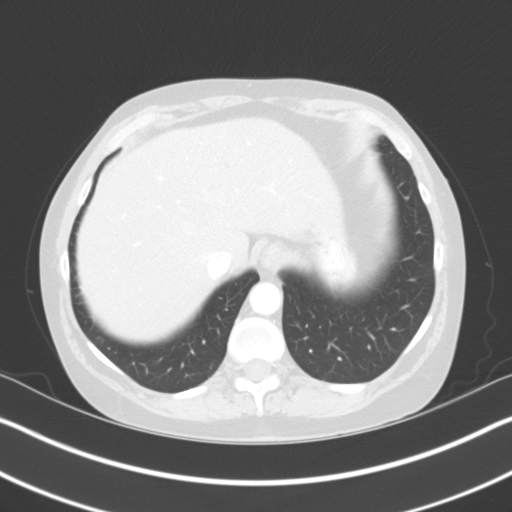
[im 82/87  soft-tissue]
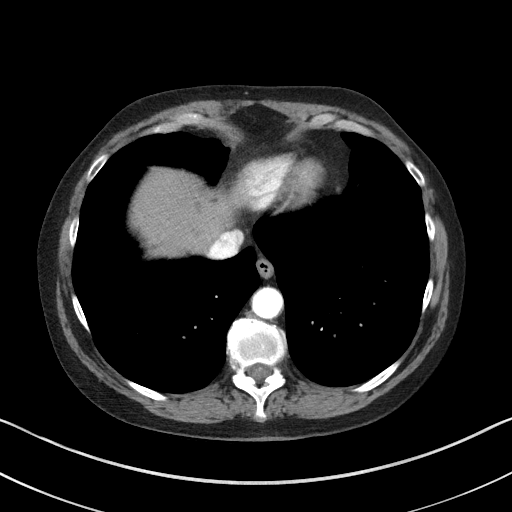
[im 82/87  lung]
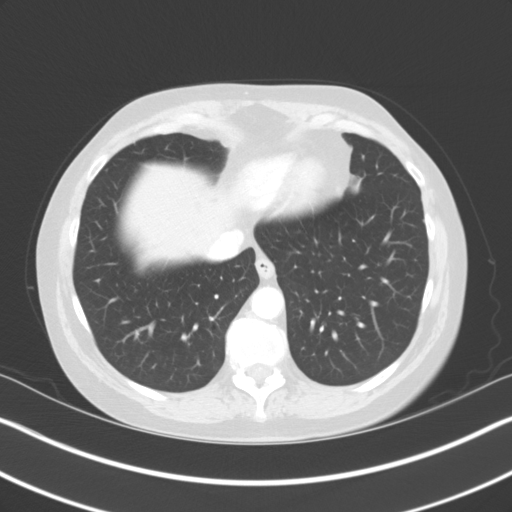

[14 of 32 positions shown; findings below may reference images not displayed]

FINDINGS: Lower Chest: No acute findings.

Hepatobiliary: No masses identified. Stable small hepatic cysts.
Stable mild steatosis. Gallbladder is unremarkable.

Pancreas:  No mass or inflammatory changes.

Spleen: Within normal limits in size and appearance.

Adrenals/Urinary Tract: No masses identified. Stable small simple
and proteinaceous renal cysts noted bilaterally. No evidence of
hydronephrosis.

Stomach/Bowel: No evidence of obstruction, inflammatory process or
abnormal fluid collections. Normal appendix visualized.

Vascular/Lymphatic: No pathologically enlarged lymph nodes. No
abdominal aortic aneurysm. Aortic atherosclerosis.

Reproductive:  No mass or other significant abnormality.

Other:  None.

Musculoskeletal:  No suspicious bone lesions identified.
IMPRESSION: Stable exam.  No acute findings.

Mild hepatic steatosis.

Aortic atherosclerosis.

## 2016-11-19 MED ORDER — IOPAMIDOL (ISOVUE-300) INJECTION 61%
85.0000 mL | Freq: Once | INTRAVENOUS | Status: AC | PRN
  Administered 2016-11-19: 85 mL via INTRAVENOUS

## 2016-11-24 DIAGNOSIS — J019 Acute sinusitis, unspecified: Secondary | ICD-10-CM | POA: Diagnosis not present

## 2016-11-24 DIAGNOSIS — G44209 Tension-type headache, unspecified, not intractable: Secondary | ICD-10-CM | POA: Diagnosis not present

## 2016-12-15 ENCOUNTER — Encounter: Payer: Self-pay | Admitting: *Deleted

## 2016-12-15 ENCOUNTER — Ambulatory Visit: Payer: BLUE CROSS/BLUE SHIELD | Attending: Oncology | Admitting: *Deleted

## 2016-12-15 ENCOUNTER — Ambulatory Visit
Admission: RE | Admit: 2016-12-15 | Discharge: 2016-12-15 | Disposition: A | Discharge status: Home / Self Care | Payer: Self-pay | Source: Ambulatory Visit | Attending: Oncology | Admitting: Oncology

## 2016-12-15 VITALS — BP 127/81 | HR 75 | Temp 95.5°F | Resp 18 | Ht 62.0 in | Wt 129.0 lb

## 2016-12-15 DIAGNOSIS — Z0000 Encounter for general adult medical examination without abnormal findings: Secondary | ICD-10-CM

## 2016-12-15 IMAGING — MG MM DIGITAL SCREENING BILAT W/ TOMO W/ CAD
9 of 12 series · 9 of 28 positions shown · non-contrast
Comparison: Previous exam(s).

CLINICAL DATA: Screening.

EXAM:
2D DIGITAL SCREENING BILATERAL MAMMOGRAM WITH CAD AND ADJUNCT TOMO

[R MLO]
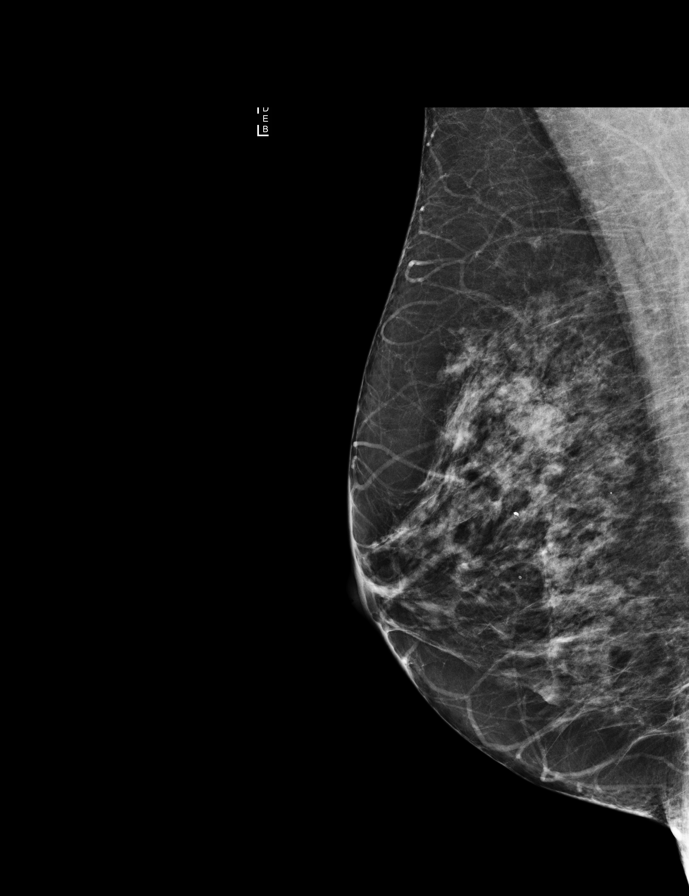

[L CC synth-2D]
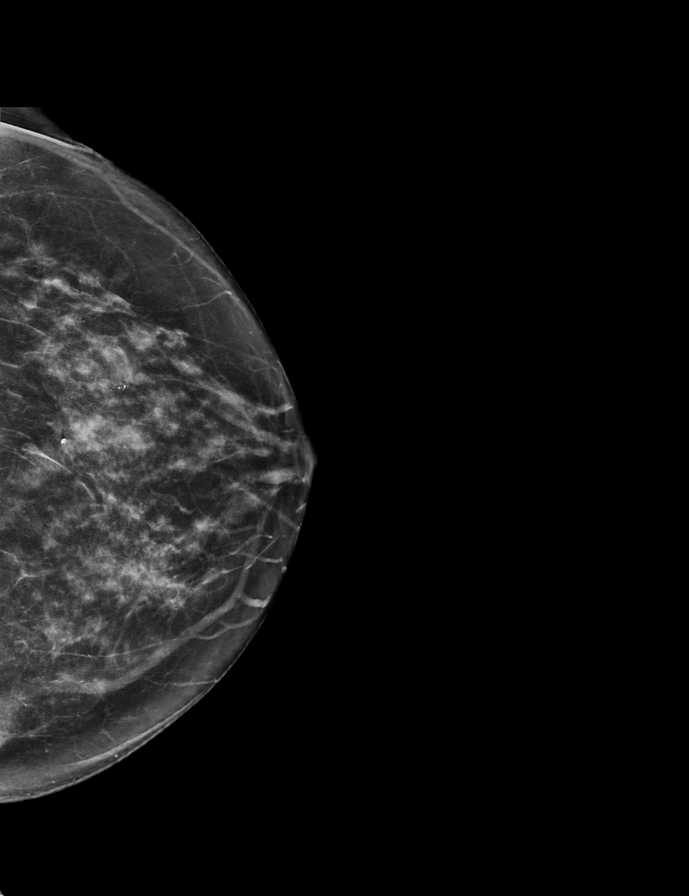

[R MLO synth-2D]
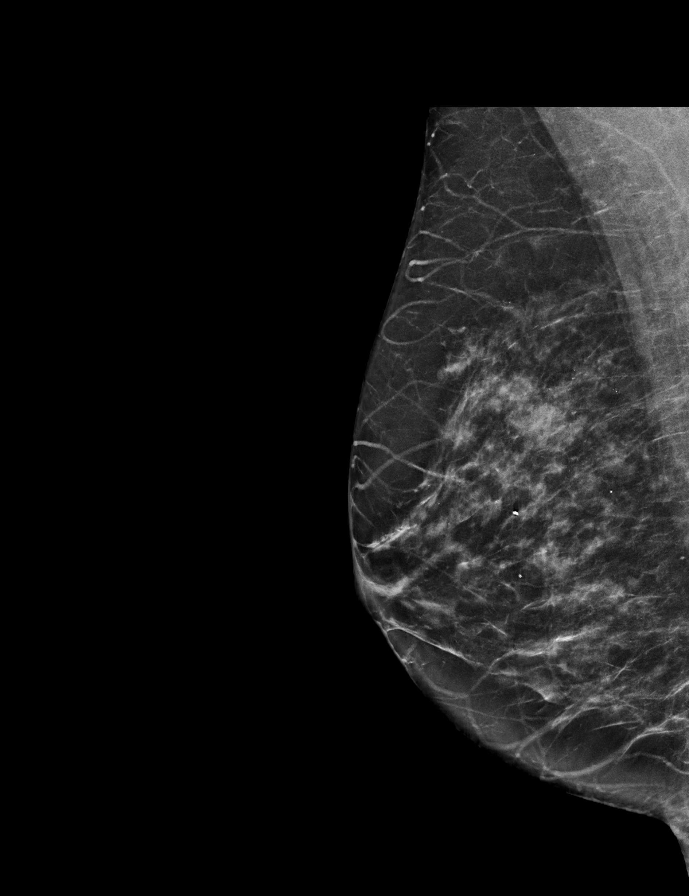

[R CC]
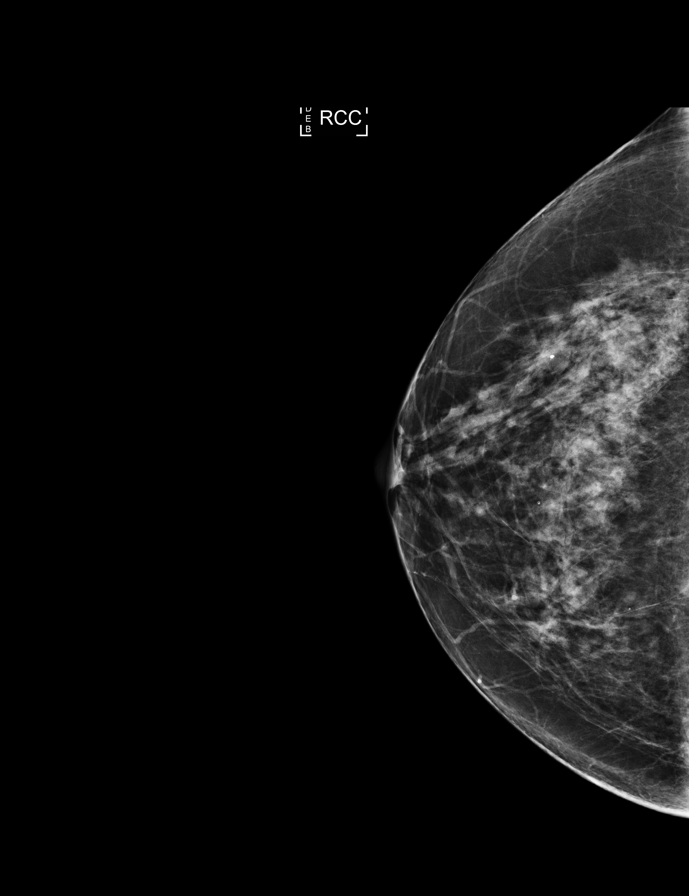

[L MLO]
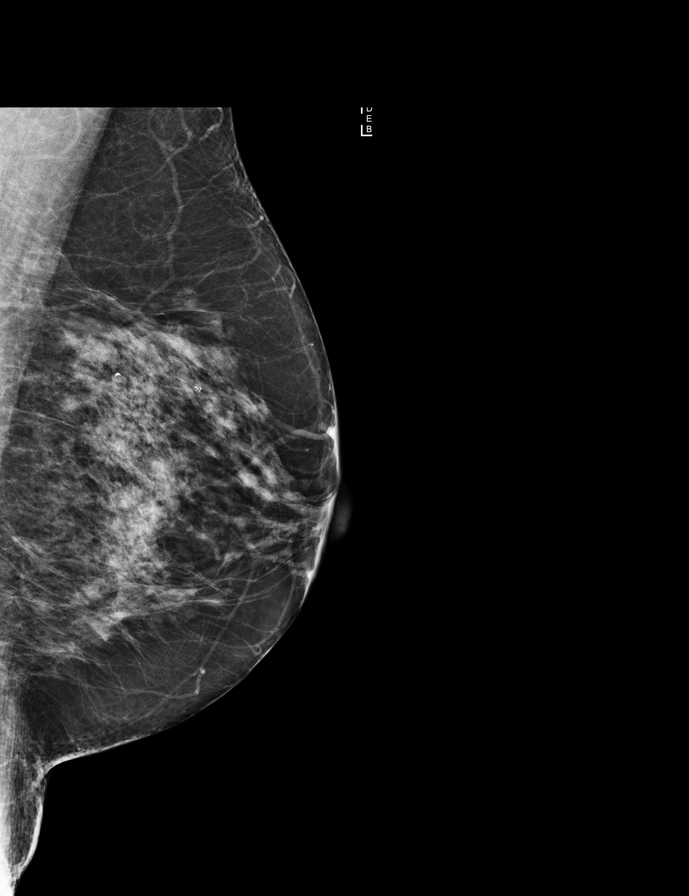

[L MLO synth-2D]
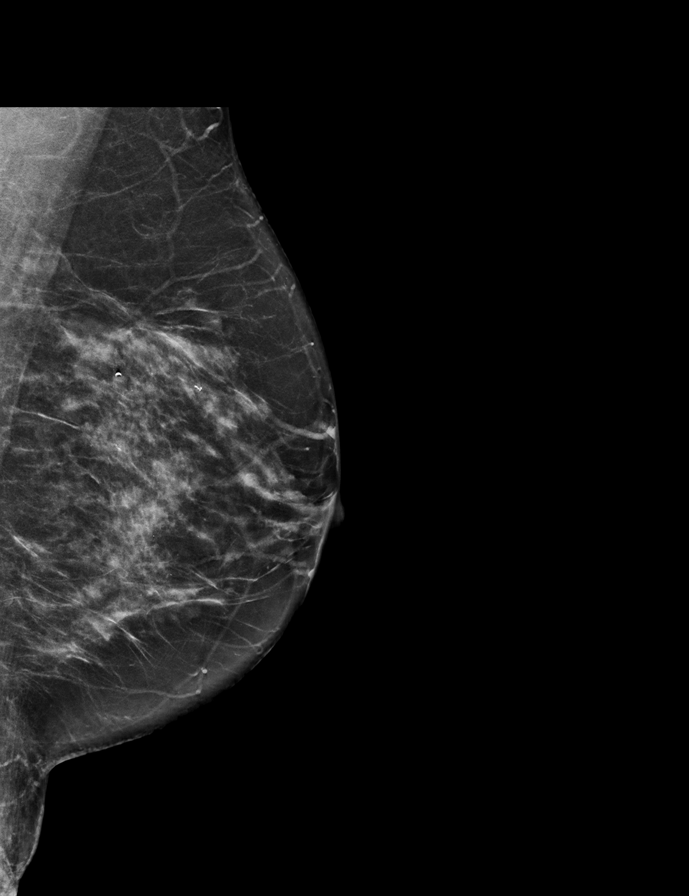

[R CC synth-2D]
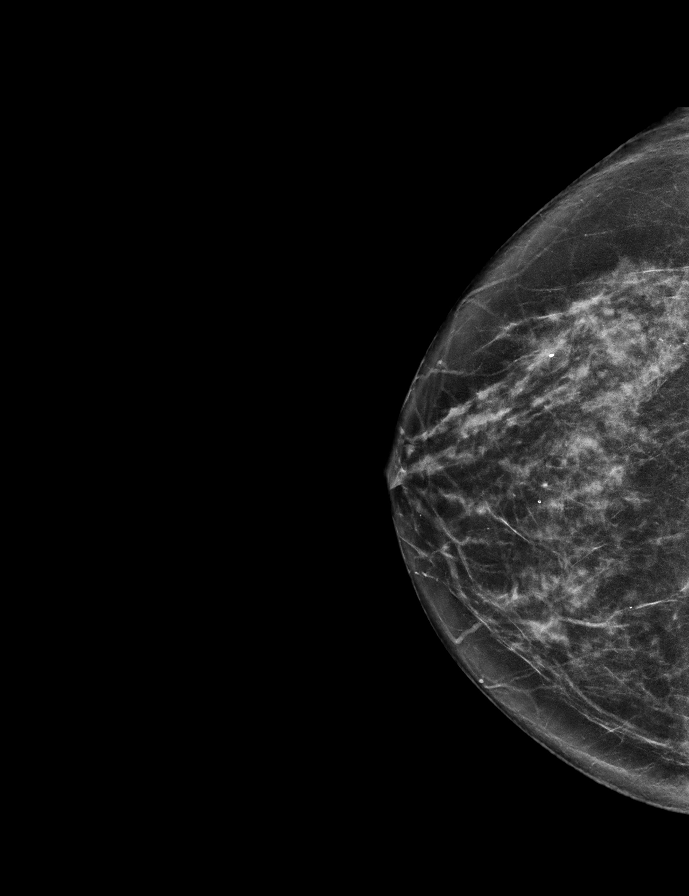

[L CC]
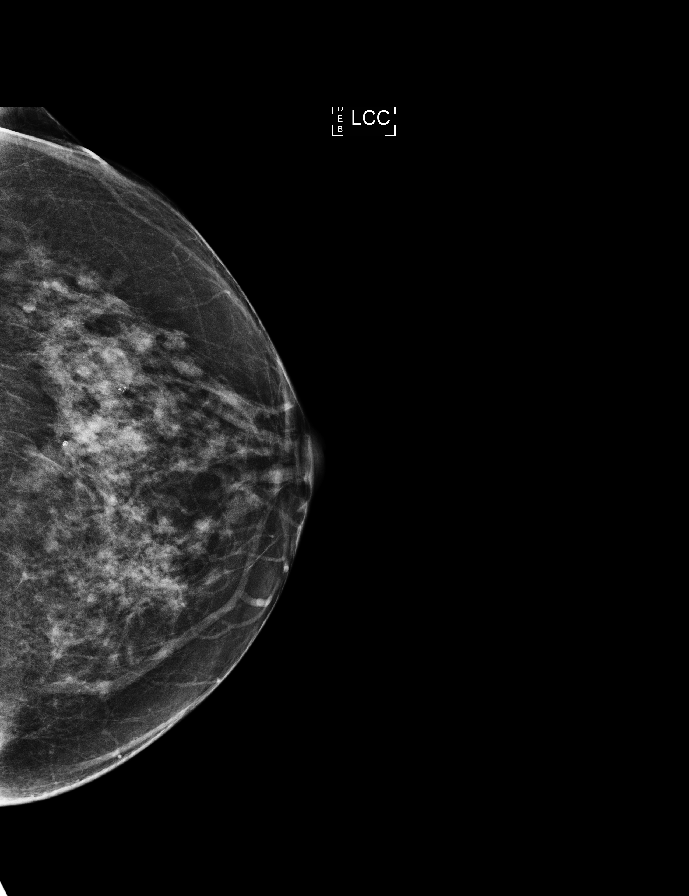

[L CC tomo · tomo slice 31/61.0]
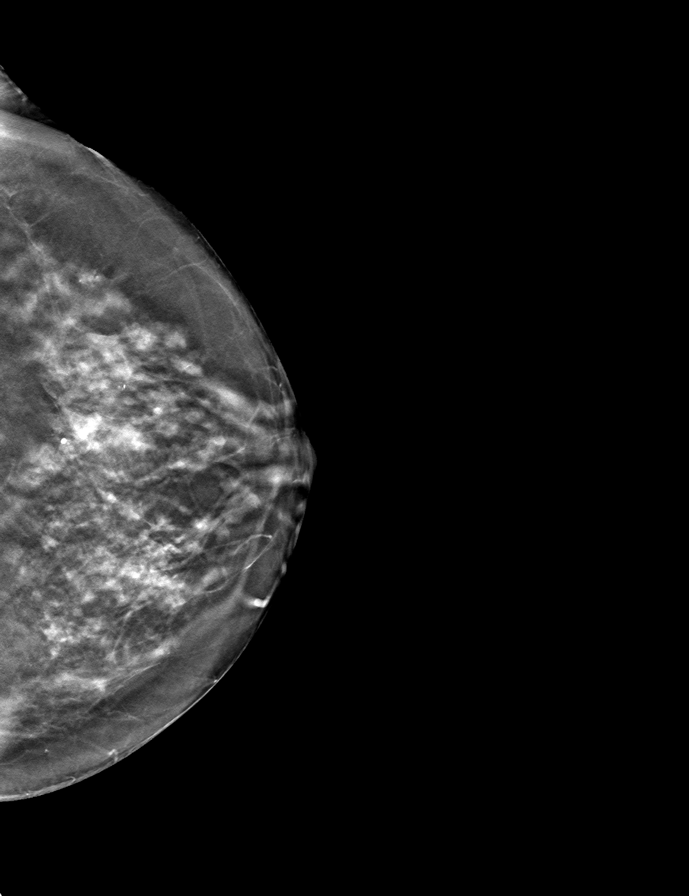

[9 of 28 positions shown; findings below may reference images not displayed]

ACR Breast Density Category c: The breast tissue is heterogeneously
dense, which may obscure small masses.
FINDINGS: There are no findings suspicious for malignancy. Images were
processed with CAD.
IMPRESSION: No mammographic evidence of malignancy. A result letter of this
screening mammogram will be mailed directly to the patient.

RECOMMENDATION:
Screening mammogram in one year. (Code:[TA])

BI-RADS CATEGORY  1: Negative.

## 2016-12-15 NOTE — Progress Notes (Signed)
Subjective:     Patient ID: Regina Carroll, female   DOB: 09/06/1960, 56 y.o.   MRN: [id number]  HPI   Review of Systems     Objective:   Physical Exam  Pulmonary/Chest: Right breast exhibits no inverted nipple, no mass, no nipple discharge, no skin change and no tenderness. Left breast exhibits no inverted nipple, no mass, no nipple discharge, no skin change and no tenderness. Breasts are symmetrical.  Abdominal: There is no splenomegaly or hepatomegaly.    Genitourinary: No labial fusion. There is no rash, tenderness, lesion or injury on the right labia. There is no rash, tenderness, lesion or injury on the left labia. Cervix exhibits no motion tenderness, no discharge and no friability. Right adnexum displays no mass, no tenderness and no fullness. Left adnexum displays no mass, no tenderness and no fullness. No erythema, tenderness or bleeding in the vagina. No foreign body in the vagina. No signs of injury around the vagina. No vaginal discharge found.       Assessment:     56 year old White female returns to [facility] for annual screening.  Clinical breast exam unremarkable.  Taught self breast awareness.  Last pap on 12/10/15 was negative/HPV positive.  Specimen collected for pap smear per protocol.  Patient has been screened for eligibility.  She does not have any insurance, Medicare or Medicaid.  She also meets financial eligibility.  Hand-out given on the Affordable Care Act.    Plan:     Screening mammogram ordered.  Specimen for pap smear sent to the lab.  Will follow-up per [facility] protocol.

## 2016-12-15 NOTE — Patient Instructions (Signed)
HPV Test The human papillomavirus (HPV) test is used to look for high-risk types of HPV infection. HPV is a group of about 100 viruses. Many of these viruses cause growths on, in, or around the genitals. Most HPV viruses cause infections that usually go away without treatment. However, HPV types 6, 11, 16, and 18 are considered high-risk types of HPV that can increase your risk of cancer of the cervix or anus if the infection is left untreated. An HPV test identifies the DNA (genetic) strands of the HPV infection, so it is also referred to as the HPV DNA test. Although HPV is found in both males and females, the HPV test is only used to screen for increased cancer risk in females:  With an abnormal Pap test.  After treatment of an abnormal Pap test.  Between the ages of 30 and 65.  After treatment of a high-risk HPV infection. The HPV test may be done at the same time as a pelvic exam and Pap test in females over the age of 30. Both the HPV test and Pap test require a sample of cells from the cervix. How do I prepare for this test?  Do not douche or take a bath for 24-48 hours before the test or as directed by your health care provider.  Do not have sex for 24-48 hours before the test or as directed by your health care provider.  You may be asked to reschedule the test if you are menstruating.  You will be asked to urinate before the test. What do the results mean? It is your responsibility to obtain your test results. Ask the lab or department performing the test when and how you will get your results. Talk with your health care provider if you have any questions about your results. Your result will be negative or positive. Meaning of Negative Test Results  A negative HPV test result means that no HPV was found, and it is very likely that you do not have HPV. Meaning of Positive Test Results  A positive HPV test result indicates that you have HPV.  If your test result shows the presence  of any high-risk HPV strains, you may have an increased risk of developing cancer of the cervix or anus if the infection is left untreated.  If any low-risk HPV strains are found, you are not likely to have an increased risk of cancer. Discuss your test results with your health care provider. He or she will use the results to make a diagnosis and determine a treatment plan that is right for you. Talk with your health care provider to discuss your results, treatment options, and if necessary, the need for more tests. Talk with your health care provider if you have any questions about your results. This information is not intended to replace advice given to you by your health care provider. Make sure you discuss any questions you have with your health care provider. Document Released: 07/02/2004 Document Revised: 02/11/2016 Document Reviewed: 10/23/2013 Elsevier Interactive Patient Education  2017 Elsevier Inc.  Gave patient hand-out, Women Staying Healthy, Active and Well from [facility], with education on breast health, pap smears, heart and colon health.  

## 2016-12-17 LAB — PAP LB AND HPV HIGH-RISK
HPV, high-risk: POSITIVE — AB
PAP Smear Comment: 0

## 2016-12-21 ENCOUNTER — Telehealth: Payer: Self-pay | Admitting: *Deleted

## 2016-12-21 NOTE — Telephone Encounter (Signed)
Called and informed patient of her normal mammogram and need to return in one year for her annual screening.  Also informed her of her normal pap results, but the positive HPV.  Explained that since she has had 2 negative, but HPV positive pap smears over the last 2 years she will further evaluation with a gyn for a colposcopy.  Patient is scheduled to see Dr. Kenton Kingfisher on 02/01/17 @ 9:00.  The patient had a preference for a Tuesday morning and this was the earliest appointment available on a Tuesday.

## 2017-01-04 DIAGNOSIS — E782 Mixed hyperlipidemia: Secondary | ICD-10-CM | POA: Diagnosis not present

## 2017-01-04 DIAGNOSIS — J449 Chronic obstructive pulmonary disease, unspecified: Secondary | ICD-10-CM | POA: Diagnosis not present

## 2017-01-04 DIAGNOSIS — K219 Gastro-esophageal reflux disease without esophagitis: Secondary | ICD-10-CM | POA: Diagnosis not present

## 2017-01-04 DIAGNOSIS — I10 Essential (primary) hypertension: Secondary | ICD-10-CM | POA: Diagnosis not present

## 2017-02-01 ENCOUNTER — Encounter: Payer: Self-pay | Admitting: Obstetrics & Gynecology

## 2017-02-01 ENCOUNTER — Ambulatory Visit (INDEPENDENT_AMBULATORY_CARE_PROVIDER_SITE_OTHER): Payer: BLUE CROSS/BLUE SHIELD | Admitting: Obstetrics & Gynecology

## 2017-02-01 VITALS — BP 120/70 | HR 66 | Ht 61.5 in | Wt 130.0 lb

## 2017-02-01 DIAGNOSIS — B977 Papillomavirus as the cause of diseases classified elsewhere: Secondary | ICD-10-CM | POA: Insufficient documentation

## 2017-02-01 DIAGNOSIS — N870 Mild cervical dysplasia: Secondary | ICD-10-CM | POA: Diagnosis not present

## 2017-02-01 DIAGNOSIS — Z87410 Personal history of cervical dysplasia: Secondary | ICD-10-CM | POA: Diagnosis not present

## 2017-02-01 NOTE — Progress Notes (Signed)
Referring Provider:  [facility]  HPI:  Regina Carroll is a 56 y.o.  [id number]  who presents today for evaluation and management of abnormal cervical cytology.    Dysplasia History:  HPV on past 2 PAP Prior h/o laser tx to cervix many years ago, per pt  ROS:  Pertinent items noted in HPI and remainder of comprehensive ROS otherwise negative.  OB History  Gravida Para Term Preterm AB Living  3 2 2   1 2   SAB TAB Ectopic Multiple Live Births    1          # Outcome Date GA Lbr Len/2nd Weight Sex Delivery Anes PTL Lv  3 TAB           2 Term           1 Term               Past Medical History:  Diagnosis Date  . Abnormal cervical cytology   . Adjustment reaction with anxiety and depression   . Anxiety   . COPD (chronic obstructive pulmonary disease) ([place])   . Depression   . Diarrhea   . Gonorrhea   . Gout   . History of acute PID   . Hx of trichomoniasis   . Hypertension     Past Surgical History:  Procedure Laterality Date  . BREAST EXCISIONAL BIOPSY Left 02/17/2009   neg  . breast mass removed    . CESAREAN SECTION Bilateral unk  . COLONOSCOPY WITH PROPOFOL N/A 09/28/2016   Procedure: COLONOSCOPY WITH PROPOFOL;  Surgeon: Lollie Sails, MD;  Location: [facility] ENDOSCOPY;  Service: Endoscopy;  Laterality: N/A;  . ESOPHAGOGASTRODUODENOSCOPY (EGD) WITH PROPOFOL N/A 09/28/2016   Procedure: ESOPHAGOGASTRODUODENOSCOPY (EGD) WITH PROPOFOL;  Surgeon: Lollie Sails, MD;  Location: [facility] ENDOSCOPY;  Service: Endoscopy;  Laterality: N/A;  . TUBAL LIGATION      SOCIAL HISTORY: History  Alcohol Use  . Yes    Comment: occassional   History  Drug Use No     Family History  Problem Relation Age of Onset  . Breast cancer Maternal Grandmother     ALLERGIES:  Codeine and Penicillins  Current Outpatient Prescriptions on File Prior to Visit  Medication Sig Dispense Refill  . albuterol (PROVENTIL HFA;VENTOLIN HFA) 108 (90 Base) MCG/ACT inhaler Inhale 2 puffs into the lungs  every 6 (six) hours as needed for wheezing or shortness of breath. 1 Inhaler 0  . atenolol (TENORMIN) 25 MG tablet Take by mouth daily.    Marland Kitchen atorvastatin (LIPITOR) 20 MG tablet Take 20 mg by mouth daily.    Marland Kitchen azithromycin (ZITHROMAX Z-PAK) 250 MG tablet Take 2 tablets (500 mg) on  Day 1,  followed by 1 tablet (250 mg) once daily on Days 2 through 5. (Patient not taking: Reported on 09/28/2016) 6 each 0  . benzonatate (TESSALON PERLES) 100 MG capsule Take 1 capsule (100 mg total) by mouth 3 (three) times daily as needed for cough (Take 1-2 per dose). 30 capsule 0  . buPROPion (WELLBUTRIN XL) 150 MG 24 hr tablet Take 150 mg by mouth daily.    . colchicine 0.6 MG tablet Take 0.6 mg by mouth daily as needed.    . fluticasone (FLONASE) 50 MCG/ACT nasal spray Place 1 spray into both nostrils daily. (Patient not taking: Reported on 09/28/2016) 16 g 0  . hydrochlorothiazide (HYDRODIURIL) 25 MG tablet Take 25 mg by mouth daily.    Marland Kitchen ibuprofen (ADVIL,MOTRIN) 800 MG tablet Take  1 tablet (800 mg total) by mouth every 8 (eight) hours as needed for moderate pain. 15 tablet 0  . methocarbamol (ROBAXIN-750) 750 MG tablet Take 2 tablets (1,500 mg total) by mouth 4 (four) times daily. (Patient not taking: Reported on 09/28/2016) 40 tablet 0  . SUMAtriptan (IMITREX) 50 MG tablet Take 50 mg by mouth every 2 (two) hours as needed for migraine. May repeat in 2 hours if headache persists or recurs.    Marland Kitchen tiotropium (SPIRIVA) 18 MCG inhalation capsule Place 18 mcg into inhaler and inhale daily.    Marland Kitchen tobramycin (TOBREX) 0.3 % ophthalmic solution Place 2 drops into the left eye every 4 (four) hours. (Patient not taking: Reported on 09/28/2016) 5 mL 0  . traMADol (ULTRAM) 50 MG tablet Take 1 tablet (50 mg total) by mouth every 6 (six) hours as needed for moderate pain. (Patient not taking: Reported on 09/28/2016) 12 tablet 0  . vitamin B-12 (CYANOCOBALAMIN) 100 MCG tablet Take by mouth daily.     No current facility-administered  medications on file prior to visit.    Physical Exam: -Vitals:  BP 120/70   Pulse 66   Ht 5' 1.5" (1.562 m)   Wt 130 lb (59 kg)   LMP 12/09/2000 (Approximate)   BMI 24.17 kg/m  GEN: WD, WN, NAD.  A+ O x 3, good mood and affect. ABD:  NT, ND.  Soft, no masses.  No hernias noted.   Pelvic:   Vulva: Normal appearance.  No lesions.  Vagina: No lesions or abnormalities noted.  Support: Normal pelvic support.  Urethra No masses tenderness or scarring.  Meatus Normal size without lesions or prolapse.  Cervix: See below.  Anus: Normal exam.  No lesions.  Perineum: Normal exam.  No lesions.        Bimanual   Uterus: Normal size.  Non-tender.  Mobile.  AV.  Adnexae: No masses.  Non-tender to palpation.  Cul-de-sac: Negative for abnormality.   PROCEDURE: 1.  Urine Pregnancy Test:  not done 2.  Colposcopy performed with 4% acetic acid after verbal consent obtained                           -Aceto-white Lesions Location(s): 1 o'clock.              -Biopsy performed at 1, 10 o'clock               -ECC indicated and performed: Yes.       -Biopsy sites made hemostatic with pressure, AgNO3, and/or Monsel's solution   -Satisfactory colposcopy: Yes.      -Evidence of Invasive cervical CA :  NO  ASSESSMENT:  Regina Carroll is a 56 y.o. [id number] here for  1. HPV in female   . PLAN: 1.  I discussed the grading system of pap smears and HPV high risk viral types.  We will discuss and base management after colpo results return. 2. Follow up PAP 6 months (at [facility]), vs intervention if high grade dysplasia identified 3. Treatment of persistantly abnormal PAP smears and cervical dysplasia, even mild, is discussed w pt today in detail, as well as the pros and cons of Cryo and LEEP procedures. Will consider and discuss after results.  Barnett Applebaum, MD, [facility], [facility] 02/01/2017  9:22 AM

## 2017-02-01 NOTE — Patient Instructions (Signed)

## 2017-02-03 LAB — PATHOLOGY

## 2017-02-04 NOTE — Progress Notes (Signed)
D/w pt.  PAP 6 mos.  Reassured.  Long term dysplasia tx options discussed if persists.

## 2017-02-10 ENCOUNTER — Encounter (HOSPITAL_COMMUNITY): Payer: Self-pay | Admitting: *Deleted

## 2017-02-10 ENCOUNTER — Encounter: Payer: Self-pay | Admitting: *Deleted

## 2017-02-10 NOTE — Progress Notes (Unsigned)
Per Dr. Doreene Adas recommendation, patient has been scheduled to return to [facility] for her 6 month follow-up pap smear on 08/17/17 @ 11:00.  Letter mailed to inform patient of her appointment.

## 2017-02-22 DIAGNOSIS — M25511 Pain in right shoulder: Secondary | ICD-10-CM | POA: Diagnosis not present

## 2017-02-22 DIAGNOSIS — M7541 Impingement syndrome of right shoulder: Secondary | ICD-10-CM | POA: Diagnosis not present

## 2017-03-08 DIAGNOSIS — M7541 Impingement syndrome of right shoulder: Secondary | ICD-10-CM | POA: Diagnosis not present

## 2017-03-08 DIAGNOSIS — M25611 Stiffness of right shoulder, not elsewhere classified: Secondary | ICD-10-CM | POA: Diagnosis not present

## 2017-03-08 DIAGNOSIS — M25511 Pain in right shoulder: Secondary | ICD-10-CM | POA: Diagnosis not present

## 2017-03-08 DIAGNOSIS — M6281 Muscle weakness (generalized): Secondary | ICD-10-CM | POA: Diagnosis not present

## 2017-03-27 ENCOUNTER — Encounter: Payer: Self-pay | Admitting: Emergency Medicine

## 2017-03-27 ENCOUNTER — Emergency Department
Admission: EM | Admit: 2017-03-27 | Discharge: 2017-03-27 | Disposition: A | Discharge status: Home / Self Care | Payer: BLUE CROSS/BLUE SHIELD | Attending: Emergency Medicine | Admitting: Emergency Medicine

## 2017-03-27 DIAGNOSIS — F17200 Nicotine dependence, unspecified, uncomplicated: Secondary | ICD-10-CM | POA: Insufficient documentation

## 2017-03-27 DIAGNOSIS — I10 Essential (primary) hypertension: Secondary | ICD-10-CM | POA: Insufficient documentation

## 2017-03-27 DIAGNOSIS — H1012 Acute atopic conjunctivitis, left eye: Secondary | ICD-10-CM | POA: Insufficient documentation

## 2017-03-27 DIAGNOSIS — Z79899 Other long term (current) drug therapy: Secondary | ICD-10-CM | POA: Insufficient documentation

## 2017-03-27 DIAGNOSIS — J449 Chronic obstructive pulmonary disease, unspecified: Secondary | ICD-10-CM | POA: Insufficient documentation

## 2017-03-27 DIAGNOSIS — H5712 Ocular pain, left eye: Secondary | ICD-10-CM | POA: Diagnosis present

## 2017-03-27 MED ORDER — HYDROXYZINE HCL 50 MG PO TABS
50.0000 mg | ORAL_TABLET | Freq: Once | ORAL | Status: AC
  Administered 2017-03-27: 50 mg via ORAL
  Filled 2017-03-27: qty 1

## 2017-03-27 MED ORDER — NAPHAZOLINE HCL 0.1 % OP SOLN: 1.0000 [drp] | Freq: Four times a day (QID) | OPHTHALMIC | 0 refills | Status: DC | PRN

## 2017-03-27 MED ORDER — FLUORESCEIN SODIUM 1 MG OP STRP
ORAL_STRIP | OPHTHALMIC | Status: AC
  Filled 2017-03-27: qty 1

## 2017-03-27 MED ORDER — EYE WASH OPHTH SOLN
OPHTHALMIC | Status: AC
  Filled 2017-03-27: qty 118

## 2017-03-27 MED ORDER — TETRACAINE HCL 0.5 % OP SOLN
OPHTHALMIC | Status: AC
  Filled 2017-03-27: qty 4

## 2017-03-27 MED ORDER — HYDROXYZINE HCL 50 MG PO TABS: 50.0000 mg | ORAL_TABLET | Freq: Three times a day (TID) | ORAL | 0 refills | Status: DC | PRN

## 2017-03-27 NOTE — ED Provider Notes (Signed)
[facility] Emergency Department Provider Note   ____________________________________________   First MD Initiated Contact with Patient 03/27/17 1819     (approximate)  I have reviewed the triage vital signs and the nursing notes.   HISTORY  Chief Complaint Eye Pain    HPI Regina Carroll is a 56 y.o. female patient complaining of left eye swelling and pain to 2 days. Patient states she was pushed more in her yard yesterday and woke up the next morning with swelling to the left eye. Patient denies vision disturbance. Patient states she rates I am warm water and putting some leftover antibiotic eyedrops with no relief. Patient rates her pain discomfort as a 8/10. She described a pain as "achy".   Past Medical History:  Diagnosis Date  . Abnormal cervical cytology   . Adjustment reaction with anxiety and depression   . Anxiety   . COPD (chronic obstructive pulmonary disease) ([place])   . Depression   . Diarrhea   . Gonorrhea   . Gout   . History of acute PID   . Hx of trichomoniasis   . Hypertension     Patient Active Problem List   Diagnosis Date Noted  . HPV in female 02/01/2017    Past Surgical History:  Procedure Laterality Date  . BREAST EXCISIONAL BIOPSY Left 02/17/2009   neg  . breast mass removed    . CESAREAN SECTION Bilateral unk  . COLONOSCOPY WITH PROPOFOL N/A 09/28/2016   Procedure: COLONOSCOPY WITH PROPOFOL;  Surgeon: Lollie Sails, MD;  Location: [facility] ENDOSCOPY;  Service: Endoscopy;  Laterality: N/A;  . ESOPHAGOGASTRODUODENOSCOPY (EGD) WITH PROPOFOL N/A 09/28/2016   Procedure: ESOPHAGOGASTRODUODENOSCOPY (EGD) WITH PROPOFOL;  Surgeon: Lollie Sails, MD;  Location: [facility] ENDOSCOPY;  Service: Endoscopy;  Laterality: N/A;  . TUBAL LIGATION      Prior to Admission medications   Medication Sig Start Date End Date Taking? Authorizing Provider  albuterol (PROVENTIL HFA;VENTOLIN HFA) 108 (90 Base) MCG/ACT inhaler Inhale 2 puffs  into the lungs every 6 (six) hours as needed for wheezing or shortness of breath. 06/20/15   Menshew, Dannielle Karvonen, PA-C  atenolol (TENORMIN) 25 MG tablet Take by mouth daily.    [provider]  atorvastatin (LIPITOR) 20 MG tablet Take 20 mg by mouth daily.    [provider]  azithromycin (ZITHROMAX Z-PAK) 250 MG tablet Take 2 tablets (500 mg) on  Day 1,  followed by 1 tablet (250 mg) once daily on Days 2 through 5. Patient not taking: Reported on 09/28/2016 06/20/15   Menshew, Dannielle Karvonen, PA-C  benzonatate (TESSALON PERLES) 100 MG capsule Take 1 capsule (100 mg total) by mouth 3 (three) times daily as needed for cough (Take 1-2 per dose). 06/20/15   Menshew, Dannielle Karvonen, PA-C  buPROPion (WELLBUTRIN XL) 150 MG 24 hr tablet Take 150 mg by mouth daily.    [provider]  colchicine 0.6 MG tablet Take 0.6 mg by mouth daily as needed.    [provider]  fluticasone (FLONASE) 50 MCG/ACT nasal spray Place 1 spray into both nostrils daily. Patient not taking: Reported on 09/28/2016 06/20/15   Menshew, Dannielle Karvonen, PA-C  hydrochlorothiazide (HYDRODIURIL) 25 MG tablet Take 25 mg by mouth daily.    [provider]  hydrOXYzine (ATARAX/VISTARIL) 50 MG tablet Take 1 tablet (50 mg total) by mouth 3 (three) times daily as needed for itching. 03/27/17   Sable Feil, PA-C  ibuprofen (ADVIL,MOTRIN) 800  MG tablet Take 1 tablet (800 mg total) by mouth every 8 (eight) hours as needed for moderate pain. 11/20/14   Sable Feil, PA-C  methocarbamol (ROBAXIN-750) 750 MG tablet Take 2 tablets (1,500 mg total) by mouth 4 (four) times daily. Patient not taking: Reported on 09/28/2016 11/20/14   Sable Feil, PA-C  naphazoline [facility]) 0.1 % ophthalmic solution Place 1 drop into the left eye 4 (four) times daily as needed for eye irritation. 03/27/17   Sable Feil, PA-C  SUMAtriptan (IMITREX) 50 MG tablet Take 50 mg by mouth every 2 (two) hours as needed for  migraine. May repeat in 2 hours if headache persists or recurs.    [provider]  tiotropium (SPIRIVA) 18 MCG inhalation capsule Place 18 mcg into inhaler and inhale daily.    [provider]  tobramycin (TOBREX) 0.3 % ophthalmic solution Place 2 drops into the left eye every 4 (four) hours. Patient not taking: Reported on 09/28/2016 04/03/15   Johnn Hai, PA-C  traMADol (ULTRAM) 50 MG tablet Take 1 tablet (50 mg total) by mouth every 6 (six) hours as needed for moderate pain. Patient not taking: Reported on 09/28/2016 11/20/14   Sable Feil, PA-C  vitamin B-12 (CYANOCOBALAMIN) 100 MCG tablet Take by mouth daily.    [provider]    Allergies Codeine and Penicillins  Family History  Problem Relation Age of Onset  . Breast cancer Maternal Grandmother     Social History Social History  Substance Use Topics  . Smoking status: Current Every Day Smoker    Packs/day: 1.50    Years: 25.00  . Smokeless tobacco: Never Used  . Alcohol use No     Comment: recovering alcoholic.     Review of Systems  Constitutional: No fever/chills Eyes: No visual changes.Edematous upper and lower eyelid. ENT: No sore throat. Cardiovascular: Denies chest pain. Respiratory: Denies shortness of breath. Gastrointestinal: No abdominal pain.  No nausea, no vomiting.  No diarrhea.  No constipation. Genitourinary: Negative for dysuria. Musculoskeletal: Negative for back pain. Skin: Negative for rash. Neurological: Negative for headaches, focal weakness or numbness. Psychiatric:Anxiety and depression Endocrine:Hypertension and gout Allergic/Immunilogical: Codeine and penicillin ____________________________________________   PHYSICAL EXAM:  VITAL SIGNS: ED Triage Vitals  Enc Vitals Group     BP 03/27/17 1745 127/71     Pulse Rate 03/27/17 1745 67     Resp 03/27/17 1745 16     Temp 03/27/17 1745 98.1 F (36.7 C)     Temp Source 03/27/17 1745 Oral     SpO2  03/27/17 1745 96 %     Weight 03/27/17 1743 127 lb (57.6 kg)     Height 03/27/17 1743 5' 1.5" (1.562 m)     Head Circumference --      Peak Flow --      Pain Score 03/27/17 1742 8     Pain Loc --      Pain Edu? --      Excl. in [place]? --    Constitutional: Alert and oriented. Well appearing and in no acute distress. Eyes:Left conjunctiva is very erythematous. PERRL. EOMI. edematous upper and lower eyelids. No lesions. Staining the eye reveals no acute findings. Mouth/Throat: Mucous membranes are moist.  Oropharynx non-erythematous. Cardiovascular: Normal rate, regular rhythm. Grossly normal heart sounds.  Good peripheral circulation. Respiratory: Normal respiratory effort.  No retractions. Lungs CTAB. Neurologic:  Normal speech and language. No gross focal neurologic deficits are appreciated. No gait instability. Skin:  Skin is warm, dry and intact. No rash noted. Psychiatric: Mood and affect are normal. Speech and behavior are normal.  ____________________________________________   LABS (all labs ordered are listed, but only abnormal results are displayed)  Labs Reviewed - No data to display ____________________________________________  EKG   ____________________________________________  RADIOLOGY  No results found.  ____________________________________________   PROCEDURES  Procedure(s) performed: None  Procedures  Critical Care performed: No  ____________________________________________   INITIAL IMPRESSION / ASSESSMENT AND PLAN / ED COURSE  @[email or web address] @  Allergic conjunctivitis of the left eye. Patient given discharge Instructions. Patient given prescription for Atarax and Naphcon 8. Patient advised follow-up ophthalmology if no improvement in 2-3 days. Return right ED if condition worsens.      ____________________________________________   FINAL CLINICAL IMPRESSION(S) / ED DIAGNOSES  Final diagnoses:  Allergic conjunctivitis, left eye       NEW MEDICATIONS STARTED DURING THIS VISIT:  New Prescriptions   HYDROXYZINE (ATARAX/VISTARIL) 50 MG TABLET    Take 1 tablet (50 mg total) by mouth 3 (three) times daily as needed for itching.   NAPHAZOLINE (NAPHCON) 0.1 % OPHTHALMIC SOLUTION    Place 1 drop into the left eye 4 (four) times daily as needed for eye irritation.     Note:  This document was prepared using Dragon voice recognition software and may include unintentional dictation errors.    Sable Feil, PA-C 03/27/17 Duanne Moron, MD 03/27/17 [phone]

## 2017-03-27 NOTE — ED Triage Notes (Signed)
Pt comes into the ED via POV c/o left eye swelling and pain.  Patient states she push mowed the yard the other day and then woke up with swelling the next day.  Denies any itching, but states the eye is sore to the touch.  Patient rinsed the eye with warm water and put leftover tobramycin in her eye with no relief.

## 2017-03-27 NOTE — ED Notes (Signed)
Pt c/o LFT eye pain and swelling after mowing grass yesterday, increased blurred vision to affected eye.

## 2017-03-28 DIAGNOSIS — H05012 Cellulitis of left orbit: Secondary | ICD-10-CM | POA: Diagnosis not present

## 2017-04-28 DIAGNOSIS — F1021 Alcohol dependence, in remission: Secondary | ICD-10-CM | POA: Diagnosis not present

## 2017-04-28 DIAGNOSIS — Z79899 Other long term (current) drug therapy: Secondary | ICD-10-CM | POA: Diagnosis not present

## 2017-04-28 DIAGNOSIS — F4323 Adjustment disorder with mixed anxiety and depressed mood: Secondary | ICD-10-CM | POA: Diagnosis not present

## 2017-04-28 DIAGNOSIS — I10 Essential (primary) hypertension: Secondary | ICD-10-CM | POA: Diagnosis not present

## 2017-07-21 DIAGNOSIS — J441 Chronic obstructive pulmonary disease with (acute) exacerbation: Secondary | ICD-10-CM | POA: Diagnosis not present

## 2017-08-02 DIAGNOSIS — I10 Essential (primary) hypertension: Secondary | ICD-10-CM | POA: Diagnosis not present

## 2017-08-02 DIAGNOSIS — Z79899 Other long term (current) drug therapy: Secondary | ICD-10-CM | POA: Diagnosis not present

## 2017-08-02 DIAGNOSIS — F4323 Adjustment disorder with mixed anxiety and depressed mood: Secondary | ICD-10-CM | POA: Diagnosis not present

## 2017-08-02 DIAGNOSIS — F1021 Alcohol dependence, in remission: Secondary | ICD-10-CM | POA: Diagnosis not present

## 2017-08-11 DIAGNOSIS — Z8739 Personal history of other diseases of the musculoskeletal system and connective tissue: Secondary | ICD-10-CM | POA: Diagnosis not present

## 2017-08-11 DIAGNOSIS — F1021 Alcohol dependence, in remission: Secondary | ICD-10-CM | POA: Diagnosis not present

## 2017-08-11 DIAGNOSIS — E782 Mixed hyperlipidemia: Secondary | ICD-10-CM | POA: Diagnosis not present

## 2017-08-11 DIAGNOSIS — Z79899 Other long term (current) drug therapy: Secondary | ICD-10-CM | POA: Diagnosis not present

## 2017-08-11 DIAGNOSIS — I10 Essential (primary) hypertension: Secondary | ICD-10-CM | POA: Diagnosis not present

## 2017-08-16 ENCOUNTER — Encounter: Payer: Self-pay | Admitting: Obstetrics & Gynecology

## 2017-08-16 ENCOUNTER — Ambulatory Visit (INDEPENDENT_AMBULATORY_CARE_PROVIDER_SITE_OTHER): Payer: BLUE CROSS/BLUE SHIELD | Admitting: Obstetrics & Gynecology

## 2017-08-16 VITALS — BP 100/60 | HR 50 | Ht 61.5 in | Wt 132.0 lb

## 2017-08-16 DIAGNOSIS — N870 Mild cervical dysplasia: Secondary | ICD-10-CM | POA: Diagnosis not present

## 2017-08-16 NOTE — Progress Notes (Signed)
  HPI:  Patient is a 57 y.o. [id number] presenting for follow up evaluation of abnormal PAP smear in the past.  Her last PAP was 7 months ago and was abnormal: POS HPV. She has had a prior colposcopy. Prior biopsies (if done) were CIN I:  Diagnosis:  Part A: CERVIX, 1:00, BIOPSY:  LOW-GRADE SQUAMOUS INTRAEPITHELIAL LESION (CIN 1).  Part B: CERVIX, 10:00, BIOPSY:  LOW-GRADE SQUAMOUS INTRAEPITHELIAL LESION (CIN 1).  Part C: ENDOCERVIX, CURETTINGS:  LOW-GRADE SQUAMOUS INTRAEPITHELIAL LESION (CIN 1).   PMHx: She  has a past medical history of Abnormal cervical cytology, Adjustment reaction with anxiety and depression, Anxiety, COPD (chronic obstructive pulmonary disease) ([place]), Depression, Diarrhea, Gonorrhea, Gout, History of acute PID, trichomoniasis, and Hypertension. Also,  has a past surgical history that includes Cesarean section (Bilateral, unk); breast mass removed; Breast excisional biopsy (Left, 02/17/2009); Tubal ligation; Esophagogastroduodenoscopy (egd) with propofol (N/A, 09/28/2016); and Colonoscopy with propofol (N/A, 09/28/2016)., family history includes Breast cancer in her maternal grandmother.,  reports that she has been smoking.  She has a 37.50 pack-year smoking history. she has never used smokeless tobacco. She reports that she does not drink alcohol or use drugs.  She has a current medication list which includes the following prescription(s): albuterol, atenolol, atorvastatin, colchicine, hydrochlorothiazide, ibuprofen, sumatriptan, vitamin b-12, azithromycin, benzonatate, bupropion, fluticasone, hydroxyzine, methocarbamol, naphazoline, omeprazole, potassium gluconate, tiotropium, tobramycin, and tramadol. Also, is allergic to codeine and penicillins.  Review of Systems  All other systems reviewed and are negative.  Objective: BP 100/60   Pulse (!) 50   Ht 5' 1.5" (1.562 m)   Wt 132 lb (59.9 kg)   LMP 12/09/2000 (Approximate)   BMI 24.54 kg/m  Filed Weights   08/16/17 0822    Weight: 132 lb (59.9 kg)   Body mass index is 24.54 kg/m.  Physical examination Physical Exam  Constitutional: She is oriented to person, place, and time. She appears well-developed and well-nourished. No distress.  Genitourinary: Vagina normal and uterus normal. Pelvic exam was performed with patient supine. There is no rash, tenderness or lesion on the right labia. There is no rash, tenderness or lesion on the left labia. No erythema or bleeding in the vagina. Right adnexum does not display mass and does not display tenderness. Left adnexum does not display mass and does not display tenderness. Cervix does not exhibit motion tenderness, discharge, polyp or nabothian cyst.   Uterus is mobile and midaxial. Uterus is not enlarged or exhibiting a mass.  Abdominal: Soft. She exhibits no distension. There is no tenderness.  Musculoskeletal: Normal range of motion.  Neurological: She is alert and oriented to person, place, and time. No cranial nerve deficit.  Skin: Skin is warm and dry.  Psychiatric: She has a normal mood and affect.   ASSESSMENT:  History of Cervical Dysplasia, CIN I  Plan:  1.  I discussed the grading system of pap smears and HPV high risk viral types. 2.  Follow up PAP 6 months, vs intervention if high grade dysplasia identified. 3. Treatment of persistantly abnormal PAP smears and cervical dysplasia, even mild, is discussed w pt today in detail, as well as the pros and cons of Cryo and LEEP procedures. Will consider and discuss after results.   Barnett Applebaum, MD, [facility], [facility] 08/16/2017  9:10 AM

## 2017-08-17 ENCOUNTER — Ambulatory Visit: Payer: BLUE CROSS/BLUE SHIELD | Attending: Oncology

## 2017-08-17 LAB — PAP IG (IMAGE GUIDED): PAP Smear Comment: 0

## 2017-08-25 DIAGNOSIS — G4709 Other insomnia: Secondary | ICD-10-CM | POA: Diagnosis not present

## 2017-08-25 DIAGNOSIS — F411 Generalized anxiety disorder: Secondary | ICD-10-CM | POA: Diagnosis not present

## 2017-08-25 DIAGNOSIS — F1021 Alcohol dependence, in remission: Secondary | ICD-10-CM | POA: Diagnosis not present

## 2017-09-01 ENCOUNTER — Encounter (INDEPENDENT_AMBULATORY_CARE_PROVIDER_SITE_OTHER): Payer: BLUE CROSS/BLUE SHIELD | Admitting: Vascular Surgery

## 2017-09-08 ENCOUNTER — Encounter (INDEPENDENT_AMBULATORY_CARE_PROVIDER_SITE_OTHER): Payer: BLUE CROSS/BLUE SHIELD | Admitting: Vascular Surgery

## 2017-10-04 ENCOUNTER — Encounter (INDEPENDENT_AMBULATORY_CARE_PROVIDER_SITE_OTHER): Payer: BLUE CROSS/BLUE SHIELD | Admitting: Vascular Surgery

## 2017-10-07 DIAGNOSIS — N390 Urinary tract infection, site not specified: Secondary | ICD-10-CM | POA: Diagnosis not present

## 2017-10-07 DIAGNOSIS — M545 Low back pain: Secondary | ICD-10-CM | POA: Diagnosis not present

## 2017-10-16 DIAGNOSIS — N289 Disorder of kidney and ureter, unspecified: Secondary | ICD-10-CM | POA: Diagnosis not present

## 2017-10-16 DIAGNOSIS — M5431 Sciatica, right side: Secondary | ICD-10-CM | POA: Diagnosis not present

## 2017-10-16 DIAGNOSIS — M545 Low back pain: Secondary | ICD-10-CM | POA: Diagnosis not present

## 2017-10-16 DIAGNOSIS — N200 Calculus of kidney: Secondary | ICD-10-CM | POA: Diagnosis not present

## 2017-10-16 DIAGNOSIS — Z79899 Other long term (current) drug therapy: Secondary | ICD-10-CM | POA: Diagnosis not present

## 2017-10-16 DIAGNOSIS — F17210 Nicotine dependence, cigarettes, uncomplicated: Secondary | ICD-10-CM | POA: Diagnosis not present

## 2017-10-16 DIAGNOSIS — Z885 Allergy status to narcotic agent status: Secondary | ICD-10-CM | POA: Diagnosis not present

## 2017-10-16 DIAGNOSIS — R1032 Left lower quadrant pain: Secondary | ICD-10-CM | POA: Diagnosis not present

## 2017-10-16 DIAGNOSIS — I10 Essential (primary) hypertension: Secondary | ICD-10-CM | POA: Diagnosis not present

## 2017-10-16 DIAGNOSIS — M549 Dorsalgia, unspecified: Secondary | ICD-10-CM | POA: Diagnosis not present

## 2017-10-16 DIAGNOSIS — Z880 Allergy status to penicillin: Secondary | ICD-10-CM | POA: Diagnosis not present

## 2017-10-16 DIAGNOSIS — R911 Solitary pulmonary nodule: Secondary | ICD-10-CM | POA: Diagnosis not present

## 2017-10-20 DIAGNOSIS — B0229 Other postherpetic nervous system involvement: Secondary | ICD-10-CM | POA: Diagnosis not present

## 2017-10-20 DIAGNOSIS — N200 Calculus of kidney: Secondary | ICD-10-CM | POA: Diagnosis not present

## 2017-10-20 DIAGNOSIS — S39012A Strain of muscle, fascia and tendon of lower back, initial encounter: Secondary | ICD-10-CM | POA: Diagnosis not present

## 2017-10-24 DIAGNOSIS — F1011 Alcohol abuse, in remission: Secondary | ICD-10-CM | POA: Diagnosis not present

## 2017-10-24 DIAGNOSIS — F3340 Major depressive disorder, recurrent, in remission, unspecified: Secondary | ICD-10-CM | POA: Diagnosis not present

## 2017-10-24 DIAGNOSIS — F411 Generalized anxiety disorder: Secondary | ICD-10-CM | POA: Diagnosis not present

## 2017-10-24 DIAGNOSIS — F5105 Insomnia due to other mental disorder: Secondary | ICD-10-CM | POA: Diagnosis not present

## 2017-11-09 DIAGNOSIS — F5105 Insomnia due to other mental disorder: Secondary | ICD-10-CM | POA: Diagnosis not present

## 2017-11-09 DIAGNOSIS — F411 Generalized anxiety disorder: Secondary | ICD-10-CM | POA: Diagnosis not present

## 2017-11-09 DIAGNOSIS — F1011 Alcohol abuse, in remission: Secondary | ICD-10-CM | POA: Diagnosis not present

## 2017-11-09 DIAGNOSIS — F3340 Major depressive disorder, recurrent, in remission, unspecified: Secondary | ICD-10-CM | POA: Diagnosis not present

## 2017-12-02 DIAGNOSIS — F5105 Insomnia due to other mental disorder: Secondary | ICD-10-CM | POA: Diagnosis not present

## 2017-12-02 DIAGNOSIS — F3340 Major depressive disorder, recurrent, in remission, unspecified: Secondary | ICD-10-CM | POA: Diagnosis not present

## 2017-12-02 DIAGNOSIS — F1011 Alcohol abuse, in remission: Secondary | ICD-10-CM | POA: Diagnosis not present

## 2017-12-02 DIAGNOSIS — F411 Generalized anxiety disorder: Secondary | ICD-10-CM | POA: Diagnosis not present

## 2017-12-26 ENCOUNTER — Ambulatory Visit: Payer: BLUE CROSS/BLUE SHIELD | Attending: Oncology

## 2017-12-26 ENCOUNTER — Other Ambulatory Visit: Payer: Self-pay

## 2017-12-26 ENCOUNTER — Ambulatory Visit
Admission: RE | Admit: 2017-12-26 | Discharge: 2017-12-26 | Disposition: A | Discharge status: Home / Self Care | Payer: BLUE CROSS/BLUE SHIELD | Source: Ambulatory Visit | Attending: Oncology | Admitting: Oncology

## 2017-12-26 VITALS — Ht 63.0 in | Wt 126.0 lb

## 2017-12-26 DIAGNOSIS — Z0000 Encounter for general adult medical examination without abnormal findings: Secondary | ICD-10-CM

## 2017-12-26 IMAGING — MG MM DIGITAL SCREENING BILAT W/ TOMO W/ CAD
8 series · 9 of 24 positions shown · non-contrast
Comparison: Previous exam(s).

CLINICAL DATA: Screening.

EXAM:
DIGITAL SCREENING BILATERAL MAMMOGRAM WITH TOMO AND CAD

[R CC synth-2D]
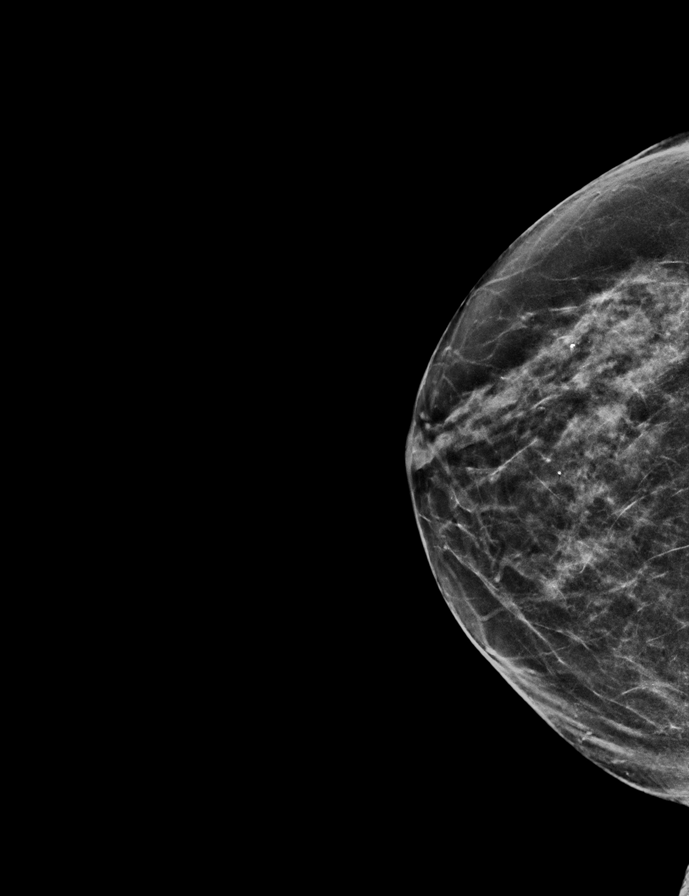

[L MLO synth-2D]
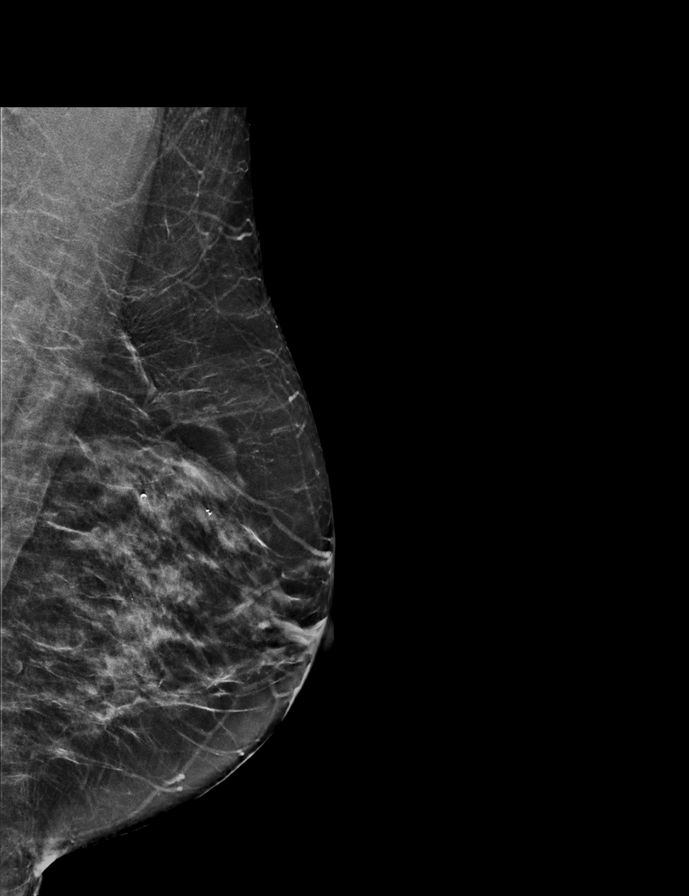

[R MLO synth-2D]
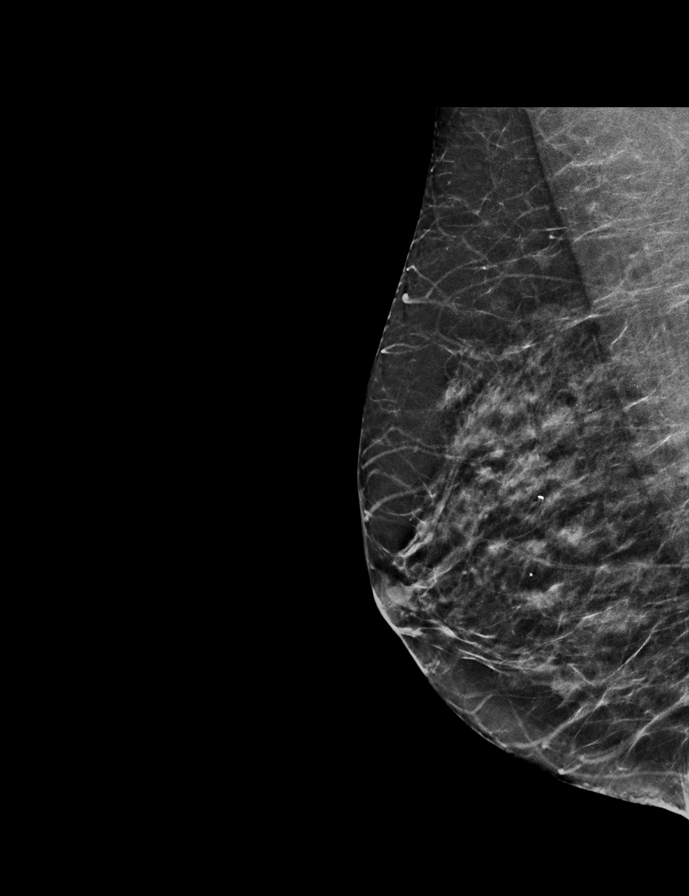

[L CC synth-2D]
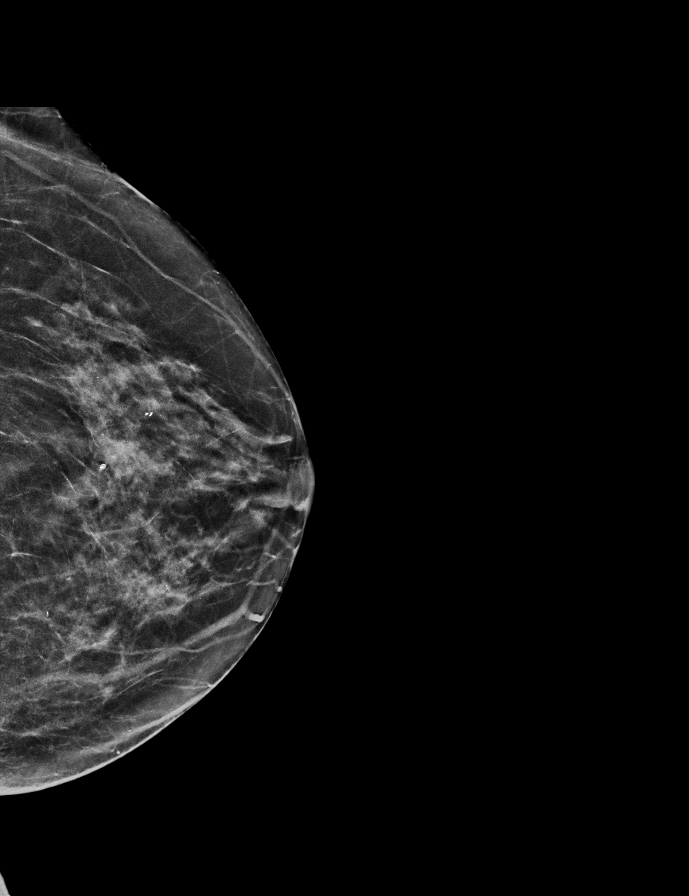

[R CC tomo · 2 of 55 frames shown]
[frame 18/55]
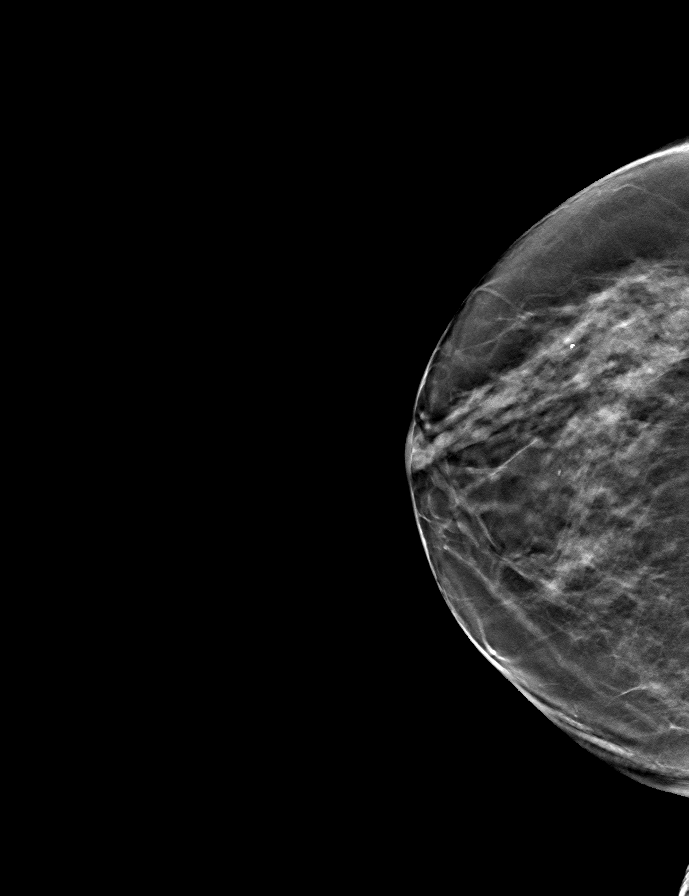
[frame 28/55]
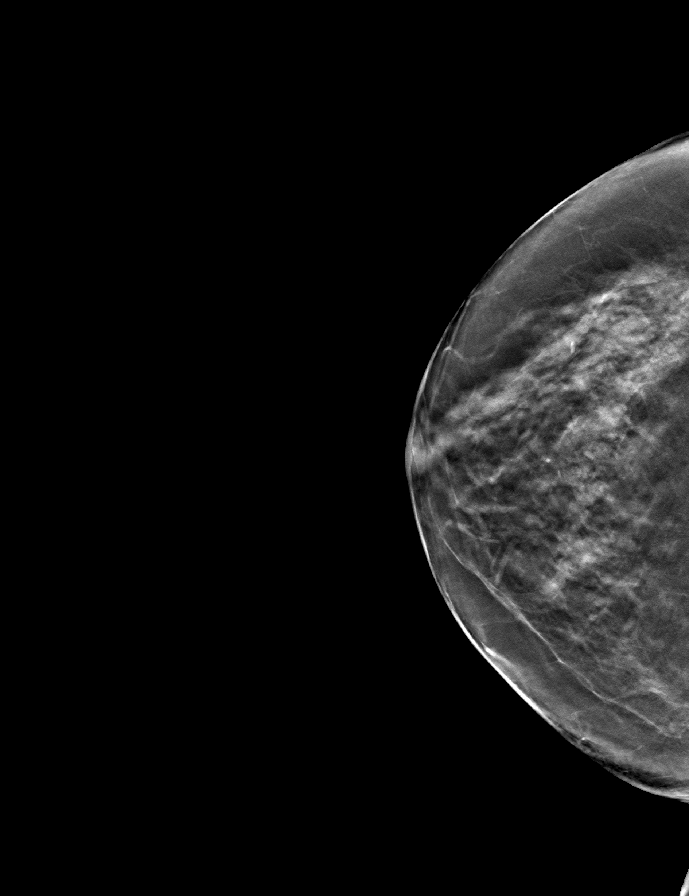

[R MLO tomo · tomo slice 27/54.0]
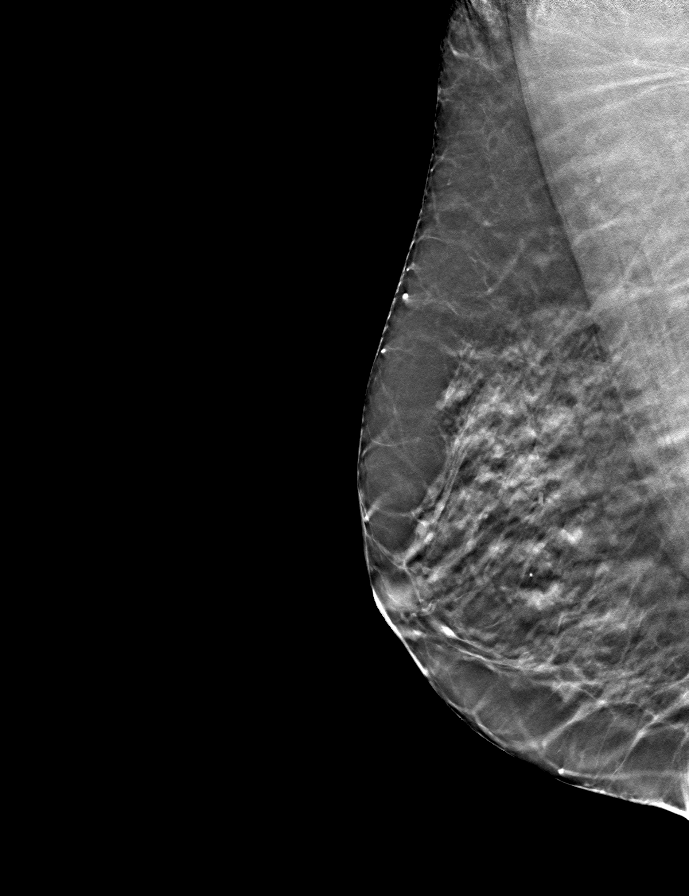

[L CC tomo · tomo slice 27/53.0]
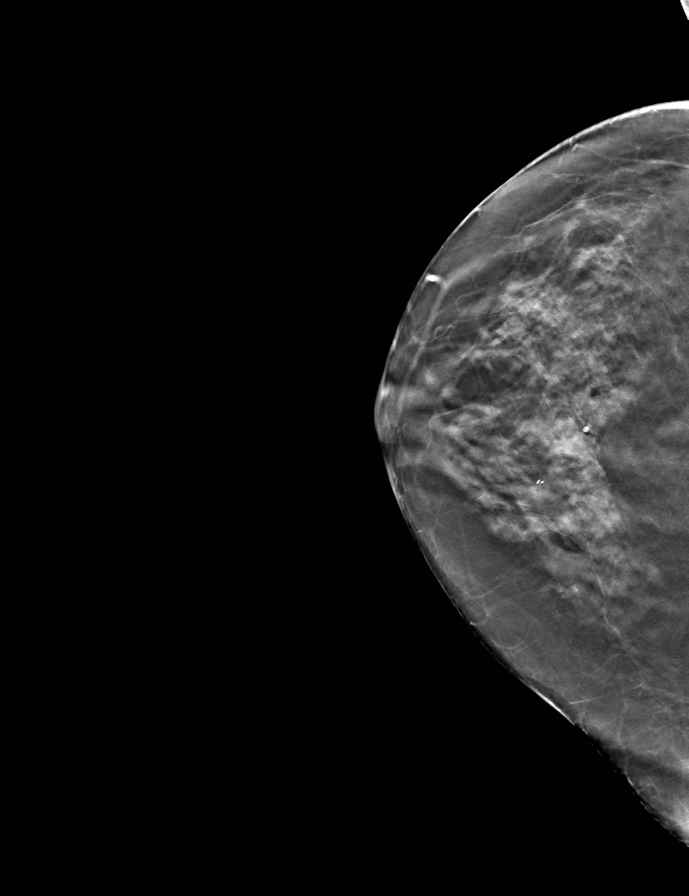

[L MLO tomo · tomo slice 29/56.0]
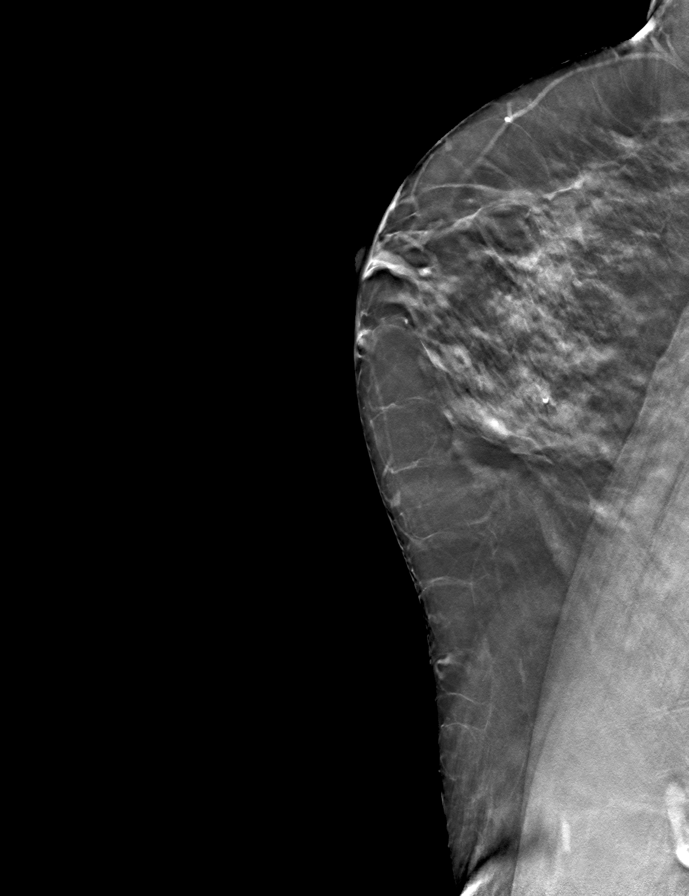

[9 of 24 positions shown; findings below may reference images not displayed]

ACR Breast Density Category c: The breast tissue is heterogeneously
dense, which may obscure small masses.
FINDINGS: There are no findings suspicious for malignancy. Images were
processed with CAD.
IMPRESSION: No mammographic evidence of malignancy. A result letter of this
screening mammogram will be mailed directly to the patient.

RECOMMENDATION:
Screening mammogram in one year. (Code:[5V])

BI-RADS CATEGORY  1: Negative.

## 2017-12-26 NOTE — Progress Notes (Signed)
  Subjective:     Patient ID: Regina Carroll, female   DOB: 06-Sep-1960, 57 y.o.   MRN: [id number]  HPI   Review of Systems     Objective:   Physical Exam  Pulmonary/Chest: Right breast exhibits no inverted nipple, no mass, no nipple discharge, no skin change and no tenderness. Left breast exhibits no inverted nipple, no mass, no nipple discharge, no skin change and no tenderness. Breasts are symmetrical.  tattoo upper left chest       Assessment:     57 year old patient presents for [facility] clinic visit.  Reports she had a benign left breast biopsy in 2008.  She also had a negative pap 08/16/17.  Per Dr. Kenton Kingfisher' note in lab report, patient will follow-up with him in one year.  Patient screened, and meets BCCCP eligibility.  Patient does not have insurance, Medicare or Medicaid.  Handout given on Affordable Care Act. Instructed patient on breast self awareness using teach back method.  Clinical breast exam unremarkable.  No mass or lump palpated.   Risk Assessment    Risk Scores      12/26/2017   Last edited by: Theodore Demark, RN   5-year risk: 1.3 %   Lifetime risk: 7 %             Plan:     Sent for bilateral screening mammogram.

## 2017-12-28 NOTE — Progress Notes (Addendum)
Letter mailed from [facility] to notify of normal mammogram results.  Patient to return in one year for annual screening. Pap results negative/negative.  Notified patient, and Dr. Kenton Kingfisher. Copy to [facility].

## 2018-01-19 ENCOUNTER — Other Ambulatory Visit: Payer: Self-pay | Admitting: Nurse Practitioner

## 2018-01-19 DIAGNOSIS — Z1231 Encounter for screening mammogram for malignant neoplasm of breast: Secondary | ICD-10-CM

## 2018-01-31 DIAGNOSIS — F1021 Alcohol dependence, in remission: Secondary | ICD-10-CM | POA: Diagnosis not present

## 2018-01-31 DIAGNOSIS — M1A9XX0 Chronic gout, unspecified, without tophus (tophi): Secondary | ICD-10-CM | POA: Diagnosis not present

## 2018-01-31 DIAGNOSIS — M2550 Pain in unspecified joint: Secondary | ICD-10-CM | POA: Diagnosis not present

## 2018-01-31 DIAGNOSIS — F4323 Adjustment disorder with mixed anxiety and depressed mood: Secondary | ICD-10-CM | POA: Diagnosis not present

## 2018-01-31 DIAGNOSIS — Z79899 Other long term (current) drug therapy: Secondary | ICD-10-CM | POA: Diagnosis not present

## 2018-01-31 DIAGNOSIS — I10 Essential (primary) hypertension: Secondary | ICD-10-CM | POA: Diagnosis not present

## 2018-03-08 DIAGNOSIS — M722 Plantar fascial fibromatosis: Secondary | ICD-10-CM | POA: Diagnosis not present

## 2018-04-03 DIAGNOSIS — Z23 Encounter for immunization: Secondary | ICD-10-CM | POA: Diagnosis not present

## 2018-04-03 DIAGNOSIS — G5753 Tarsal tunnel syndrome, bilateral lower limbs: Secondary | ICD-10-CM | POA: Diagnosis not present

## 2018-04-03 DIAGNOSIS — M722 Plantar fascial fibromatosis: Secondary | ICD-10-CM | POA: Diagnosis not present

## 2018-04-13 DIAGNOSIS — F411 Generalized anxiety disorder: Secondary | ICD-10-CM | POA: Diagnosis not present

## 2018-04-13 DIAGNOSIS — F5105 Insomnia due to other mental disorder: Secondary | ICD-10-CM | POA: Diagnosis not present

## 2018-04-13 DIAGNOSIS — F3340 Major depressive disorder, recurrent, in remission, unspecified: Secondary | ICD-10-CM | POA: Diagnosis not present

## 2018-04-13 DIAGNOSIS — F1011 Alcohol abuse, in remission: Secondary | ICD-10-CM | POA: Diagnosis not present

## 2018-05-30 ENCOUNTER — Emergency Department: Payer: BLUE CROSS/BLUE SHIELD

## 2018-05-30 ENCOUNTER — Encounter: Payer: Self-pay | Admitting: Emergency Medicine

## 2018-05-30 ENCOUNTER — Emergency Department
Admission: EM | Admit: 2018-05-30 | Discharge: 2018-05-30 | Disposition: A | Discharge status: Home / Self Care | Payer: BLUE CROSS/BLUE SHIELD | Attending: Emergency Medicine | Admitting: Emergency Medicine

## 2018-05-30 ENCOUNTER — Other Ambulatory Visit: Payer: Self-pay

## 2018-05-30 DIAGNOSIS — R079 Chest pain, unspecified: Secondary | ICD-10-CM | POA: Insufficient documentation

## 2018-05-30 DIAGNOSIS — I10 Essential (primary) hypertension: Secondary | ICD-10-CM | POA: Insufficient documentation

## 2018-05-30 DIAGNOSIS — J449 Chronic obstructive pulmonary disease, unspecified: Secondary | ICD-10-CM | POA: Diagnosis not present

## 2018-05-30 DIAGNOSIS — K219 Gastro-esophageal reflux disease without esophagitis: Secondary | ICD-10-CM | POA: Insufficient documentation

## 2018-05-30 DIAGNOSIS — F17200 Nicotine dependence, unspecified, uncomplicated: Secondary | ICD-10-CM | POA: Diagnosis not present

## 2018-05-30 HISTORY — DX: Plantar fascial fibromatosis: M72.2

## 2018-05-30 LAB — CBC
HCT: 39.8 % (ref 36.0–46.0)
Hemoglobin: 13.3 g/dL (ref 12.0–15.0)
MCH: 29.4 pg (ref 26.0–34.0)
MCHC: 33.4 g/dL (ref 30.0–36.0)
MCV: 87.9 fL (ref 80.0–100.0)
NRBC: 0 % (ref 0.0–0.2)
PLATELETS: 287 10*3/uL (ref 150–400)
RBC: 4.53 MIL/uL (ref 3.87–5.11)
RDW: 13.1 % (ref 11.5–15.5)
WBC: 7.9 10*3/uL (ref 4.0–10.5)

## 2018-05-30 LAB — HEPATIC FUNCTION PANEL
ALBUMIN: 3.9 g/dL (ref 3.5–5.0)
ALT: 21 U/L (ref 0–44)
AST: 27 U/L (ref 15–41)
Alkaline Phosphatase: 72 U/L (ref 38–126)
BILIRUBIN TOTAL: 0.5 mg/dL (ref 0.3–1.2)
Bilirubin, Direct: <0.1 mg/dL (ref 0.0–0.2)
TOTAL PROTEIN: 6.6 g/dL (ref 6.5–8.1)

## 2018-05-30 LAB — TROPONIN I

## 2018-05-30 LAB — BASIC METABOLIC PANEL
Anion gap: 7 (ref 5–15)
BUN: 20 mg/dL (ref 6–20)
CALCIUM: 8.9 mg/dL (ref 8.9–10.3)
CO2: 26 mmol/L (ref 22–32)
CREATININE: 0.61 mg/dL (ref 0.44–1.00)
Chloride: 106 mmol/L (ref 98–111)
Glucose, Bld: 127 mg/dL — ABNORMAL HIGH (ref 70–99)
Potassium: 3.3 mmol/L — ABNORMAL LOW (ref 3.5–5.1)
SODIUM: 139 mmol/L (ref 135–145)

## 2018-05-30 LAB — LIPASE, BLOOD: LIPASE: 33 U/L (ref 11–51)

## 2018-05-30 IMAGING — CR DG CHEST 2V
1 series · 2 of 2 positions shown · non-contrast
Comparison: [DATE]

CLINICAL DATA: Intermittent chest pain for 3 days

EXAM:
CHEST - 2 VIEW

[Series 1: dg chest 2 view · 0.14mm/px · 2 of 2 slices shown]
[im 1/2]
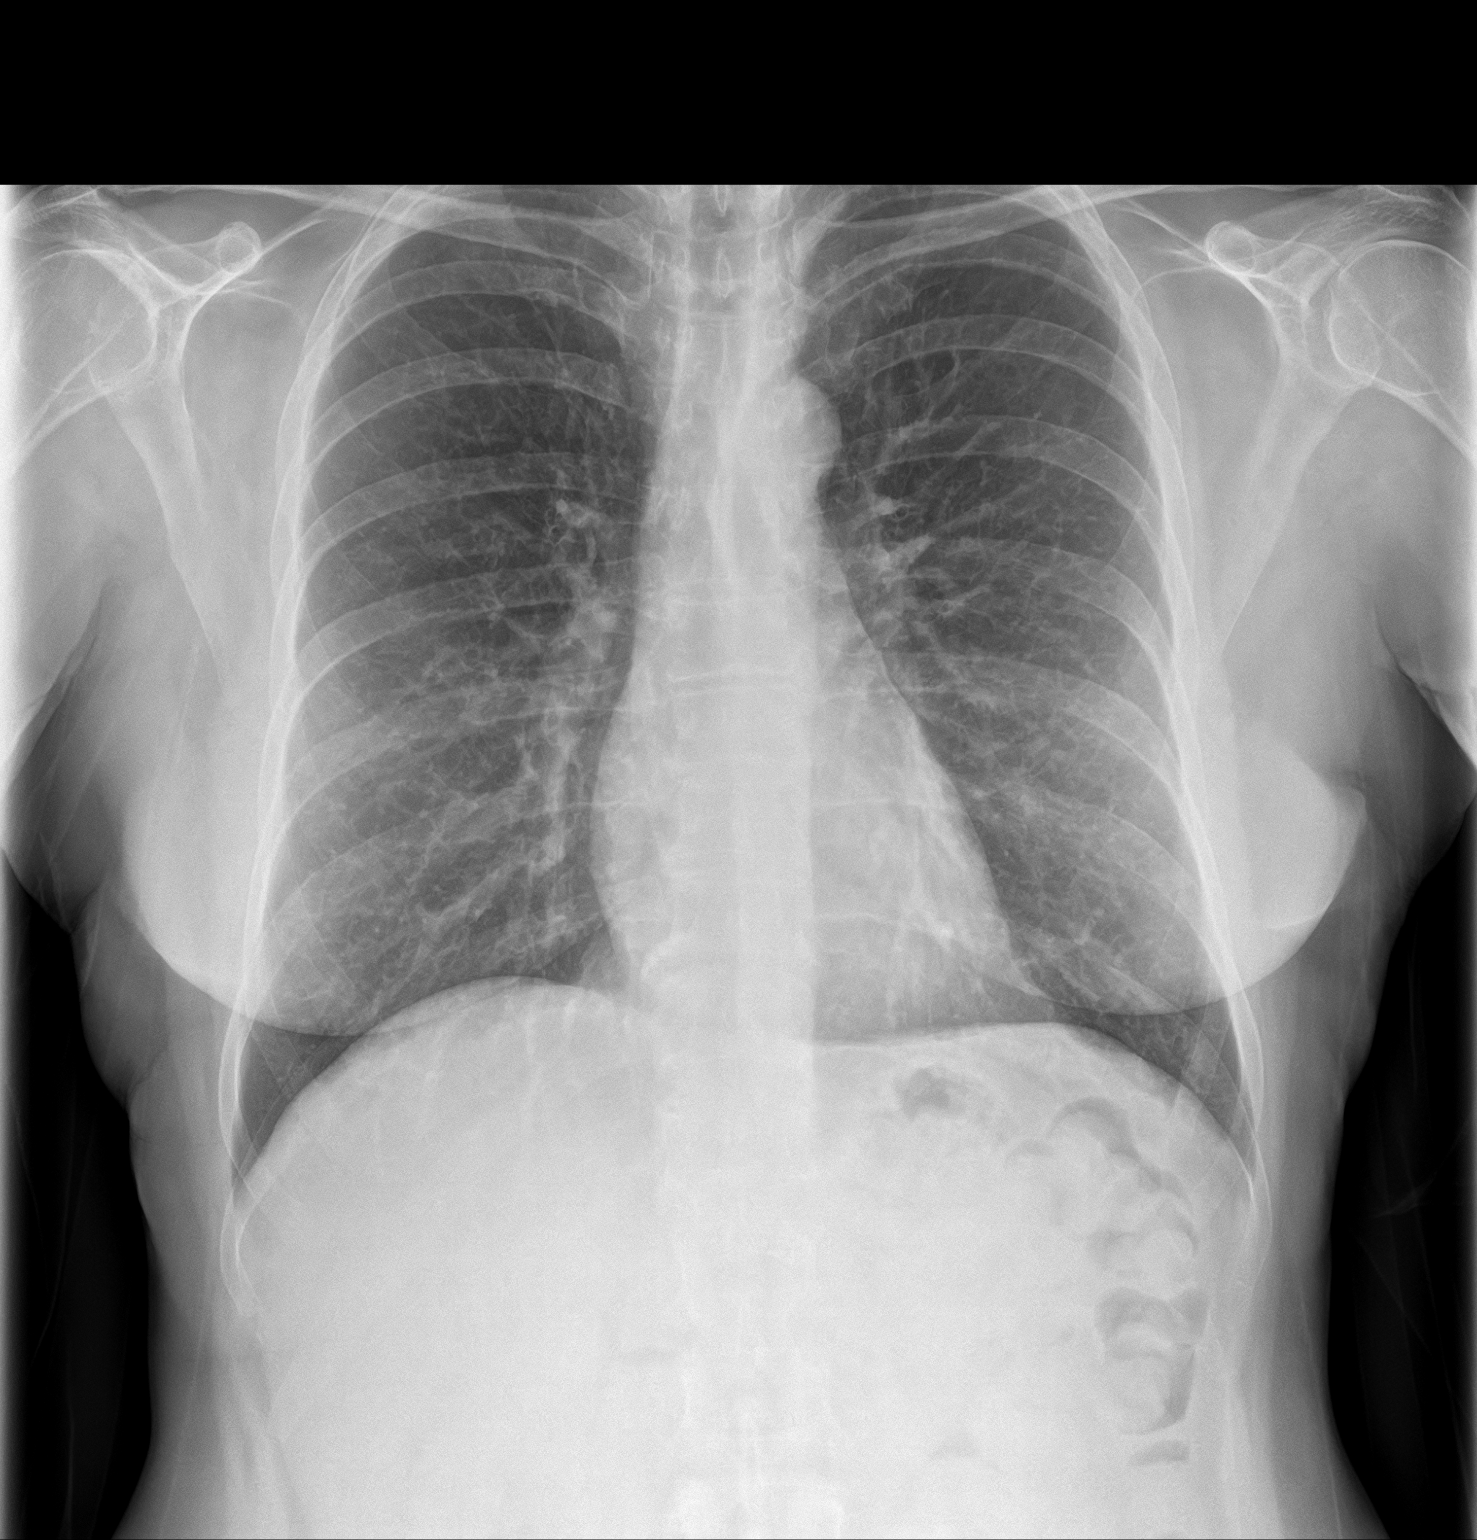
[im 2/2]
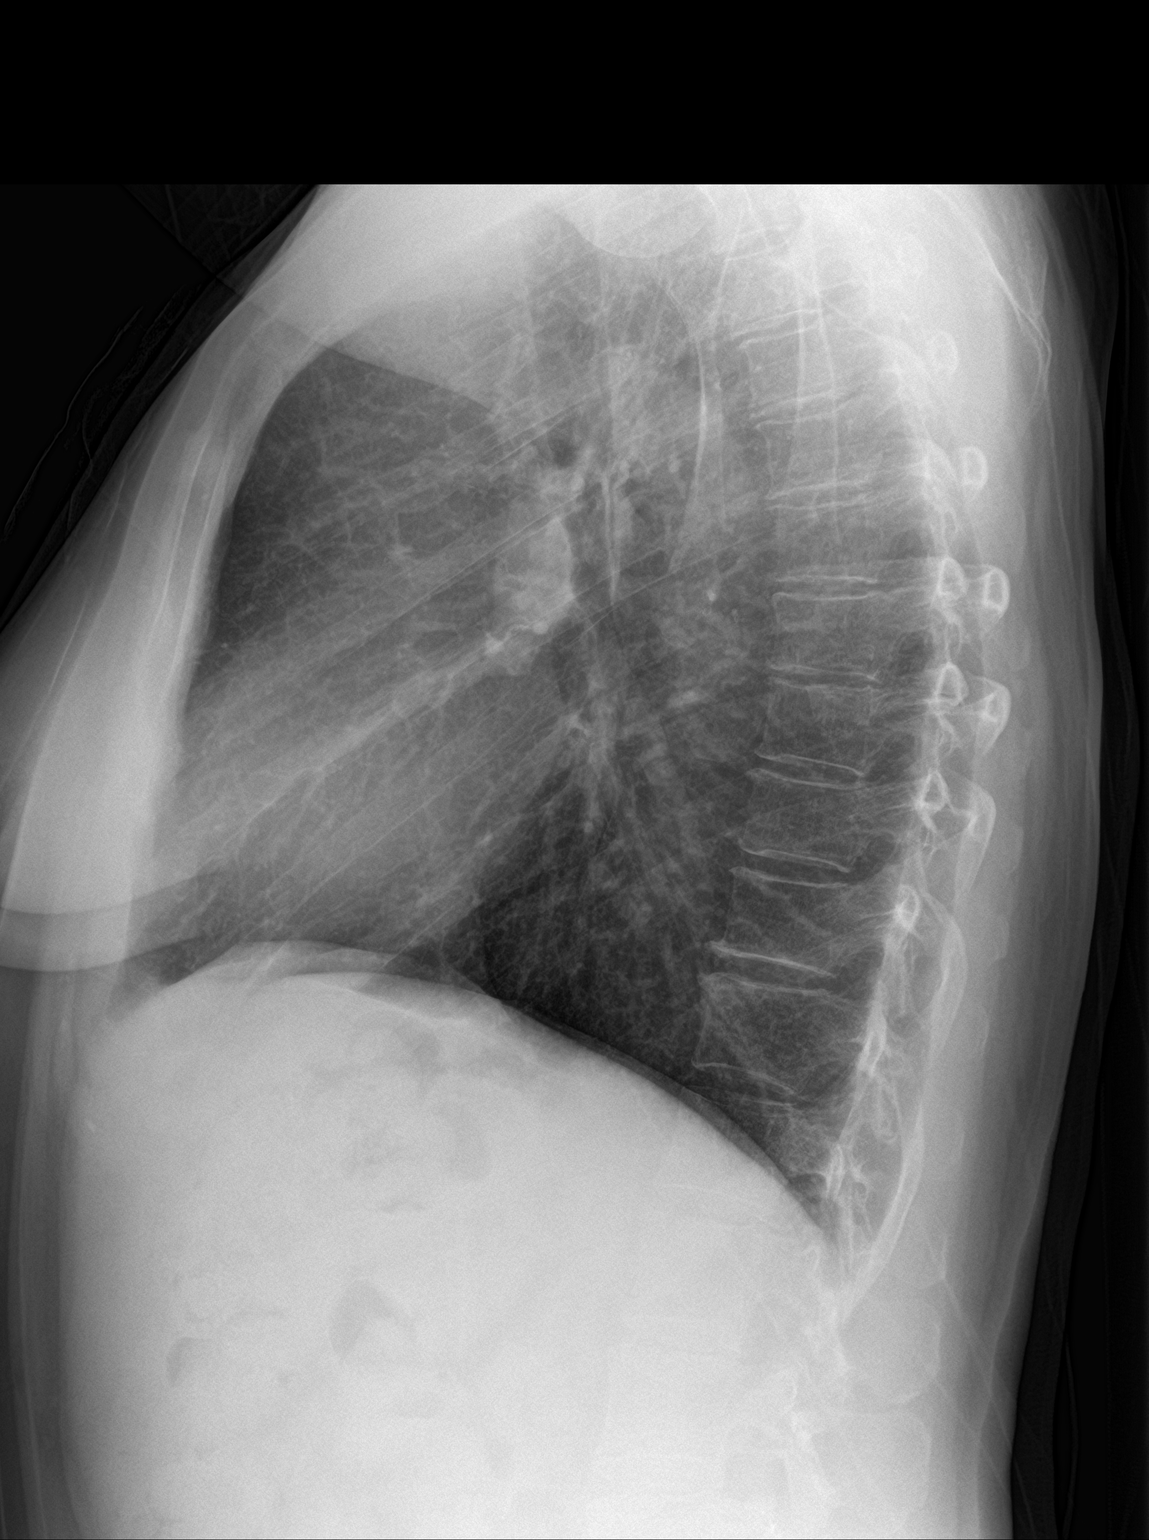

[2 of 2 positions shown; findings below may reference images not displayed]

FINDINGS: Tapering of the peripheral pulmonary vasculature favors emphysema.
Cardiac and mediastinal margins appear normal. Mild interstitial
accentuation, as can commonly be encountered in smokers. No airspace
opacity or pleural effusion. No discrete bony abnormality observed.
IMPRESSION: 1. No acute radiographic findings.
2. Emphysema.
3. Mild interstitial accentuation, as can commonly be encountered in
smokers.

## 2018-05-30 MED ORDER — ALUM & MAG HYDROXIDE-SIMETH 200-200-20 MG/5ML PO SUSP
30.0000 mL | Freq: Once | ORAL | Status: AC
  Administered 2018-05-30: 30 mL via ORAL
  Filled 2018-05-30: qty 30

## 2018-05-30 MED ORDER — LIDOCAINE VISCOUS HCL 2 % MT SOLN
15.0000 mL | Freq: Once | OROMUCOSAL | Status: AC
  Administered 2018-05-30: 15 mL via ORAL
  Filled 2018-05-30: qty 15

## 2018-05-30 MED ORDER — SUCRALFATE 1 G PO TABS: 1.0000 g | ORAL_TABLET | Freq: Four times a day (QID) | ORAL | 1 refills | Status: DC

## 2018-05-30 MED ORDER — PANTOPRAZOLE SODIUM 40 MG PO TBEC: 40.0000 mg | DELAYED_RELEASE_TABLET | Freq: Every day | ORAL | 1 refills | Status: DC

## 2018-05-30 NOTE — ED Triage Notes (Signed)
Chest pain since 5am.  Feels like a band around chest and back.  Happened yesterday am too.  No nausea, sweating.  Did have some shortness of breath.

## 2018-05-30 NOTE — ED Provider Notes (Signed)
[facility] Emergency Department Provider Note       Time seen: ----------------------------------------- 9:29 AM on 05/30/2018 -----------------------------------------   I have reviewed the triage vital signs and the nursing notes.  HISTORY   Chief Complaint Chest Pain    HPI Regina Carroll is a 57 y.o. female with a history of anxiety, COPD, depression, gout, hypertension who presents to the ED for lower chest and upper abdominal pain since 5 AM.  Patient reports this happened yesterday morning to, has not had sweats, nausea or shortness of breath.  She denies fevers, chills or other complaints.  Recently she had stopped taking Celebrex.  Reports she takes over-the-counter antacids.  Past Medical History:  Diagnosis Date  . Abnormal cervical cytology   . Adjustment reaction with anxiety and depression   . Anxiety   . COPD (chronic obstructive pulmonary disease) ([place])   . Depression   . Diarrhea   . Gonorrhea   . Gout   . History of acute PID   . Hx of trichomoniasis   . Hypertension   . Plantar fasciitis     Patient Active Problem List   Diagnosis Date Noted  . CIN I (cervical intraepithelial neoplasia I) 08/16/2017  . HPV in female 02/01/2017    Past Surgical History:  Procedure Laterality Date  . BREAST EXCISIONAL BIOPSY Left 02/17/2009   neg  . breast mass removed    . CESAREAN SECTION Bilateral unk  . COLONOSCOPY WITH PROPOFOL N/A 09/28/2016   Procedure: COLONOSCOPY WITH PROPOFOL;  Surgeon: Lollie Sails, MD;  Location: [facility] ENDOSCOPY;  Service: Endoscopy;  Laterality: N/A;  . ESOPHAGOGASTRODUODENOSCOPY (EGD) WITH PROPOFOL N/A 09/28/2016   Procedure: ESOPHAGOGASTRODUODENOSCOPY (EGD) WITH PROPOFOL;  Surgeon: Lollie Sails, MD;  Location: [facility] ENDOSCOPY;  Service: Endoscopy;  Laterality: N/A;  . TUBAL LIGATION      Allergies Codeine and Penicillins  Social History Social History   Tobacco Use  . Smoking status: Current  Every Day Smoker    Packs/day: 1.50    Years: 25.00    Pack years: 37.50  . Smokeless tobacco: Never Used  Substance Use Topics  . Alcohol use: No    Comment: recovering alcoholic.   . Drug use: No   Review of Systems Constitutional: Negative for fever. Cardiovascular: Positive for chest pain Respiratory: Negative for shortness of breath. Gastrointestinal: Positive for upper abdominal pain Musculoskeletal: Negative for back pain. Skin: Negative for rash. Neurological: Negative for headaches, focal weakness or numbness.  All systems negative/normal/unremarkable except as stated in the HPI  ____________________________________________   PHYSICAL EXAM:  VITAL SIGNS: ED Triage Vitals  Enc Vitals Group     BP 05/30/18 0748 113/66     Pulse Rate 05/30/18 0748 63     Resp 05/30/18 0748 18     Temp 05/30/18 0748 97.7 F (36.5 C)     Temp Source 05/30/18 0748 Oral     SpO2 05/30/18 0748 96 %     Weight 05/30/18 0729 130 lb (59 kg)     Height 05/30/18 0729 5\' 1"  (1.549 m)     Head Circumference --      Peak Flow --      Pain Score 05/30/18 0728 8     Pain Loc --      Pain Edu? --      Excl. in [place]? --    Constitutional: Alert and oriented. Well appearing and in no distress. Eyes: Conjunctivae are normal. Normal extraocular movements. ENT  Head: Normocephalic and atraumatic.   Nose: No congestion/rhinnorhea.   Mouth/Throat: Mucous membranes are moist.   Neck: No stridor. Cardiovascular: Normal rate, regular rhythm. No murmurs, rubs, or gallops. Respiratory: Normal respiratory effort without tachypnea nor retractions. Breath sounds are clear and equal bilaterally. No wheezes/rales/rhonchi. Gastrointestinal: Soft and nontender. Normal bowel sounds Musculoskeletal: Nontender with normal range of motion in extremities. No lower extremity tenderness nor edema. Neurologic:  Normal speech and language. No gross focal neurologic deficits are appreciated.  Skin:  Skin  is warm, dry and intact. No rash noted. Psychiatric: Mood and affect are normal. Speech and behavior are normal.  ____________________________________________  EKG: Interpreted by me.  Sinus rhythm rate of 63 bpm, normal PR interval, normal QRS, normal QT  ____________________________________________  ED COURSE:  As part of my medical decision making, I reviewed the following data within the [place] History obtained from family if available, nursing notes, old chart and ekg, as well as notes from prior ED visits. Patient presented for lower chest and upper abdominal pain, we will assess with labs and imaging as indicated at this time.   Procedures ____________________________________________   LABS (pertinent positives/negatives)  Labs Reviewed  BASIC METABOLIC PANEL - Abnormal; Notable for the following components:      Result Value   Potassium 3.3 (*)    Glucose, Bld 127 (*)    All other components within normal limits  CBC  TROPONIN I  HEPATIC FUNCTION PANEL  LIPASE, BLOOD    RADIOLOGY Images were viewed by me  Chest x-ray is unremarkable IMPRESSION: 1. No acute radiographic findings. 2. Emphysema. 3. Mild interstitial accentuation, as can commonly be encountered in smokers.  ____________________________________________  DIFFERENTIAL DIAGNOSIS   Peptic ulcer disease, GERD, gastritis, unstable angina, MI, musculoskeletal pain, anxiety  FINAL ASSESSMENT AND PLAN  Chest pain, GERD   Plan: The patient had presented for lower chest and upper abdominal pain. Patient's labs were reassuring. Patient's imaging did not reveal any acute process.  She was given a GI cocktail with improvement and resolution in her symptoms.  I will place her on Carafate and Protonix.  She is cleared for outpatient follow-up.   Laurence Aly, MD   Note: This note was generated in part or whole with voice recognition software. Voice recognition is usually  quite accurate but there are transcription errors that can and very often do occur. I apologize for any typographical errors that were not detected and corrected.     Earleen Newport, MD 05/30/18 [phone]

## 2018-08-05 DIAGNOSIS — F3340 Major depressive disorder, recurrent, in remission, unspecified: Secondary | ICD-10-CM | POA: Diagnosis not present

## 2018-08-05 DIAGNOSIS — F411 Generalized anxiety disorder: Secondary | ICD-10-CM | POA: Diagnosis not present

## 2018-08-05 DIAGNOSIS — F5105 Insomnia due to other mental disorder: Secondary | ICD-10-CM | POA: Diagnosis not present

## 2018-08-05 DIAGNOSIS — F1011 Alcohol abuse, in remission: Secondary | ICD-10-CM | POA: Diagnosis not present

## 2018-10-19 DIAGNOSIS — M1A9XX0 Chronic gout, unspecified, without tophus (tophi): Secondary | ICD-10-CM | POA: Diagnosis not present

## 2018-10-19 DIAGNOSIS — I10 Essential (primary) hypertension: Secondary | ICD-10-CM | POA: Diagnosis not present

## 2018-10-19 DIAGNOSIS — Z23 Encounter for immunization: Secondary | ICD-10-CM | POA: Diagnosis not present

## 2018-10-19 DIAGNOSIS — Z0000 Encounter for general adult medical examination without abnormal findings: Secondary | ICD-10-CM | POA: Diagnosis not present

## 2018-10-19 DIAGNOSIS — R61 Generalized hyperhidrosis: Secondary | ICD-10-CM | POA: Diagnosis not present

## 2018-10-19 DIAGNOSIS — E782 Mixed hyperlipidemia: Secondary | ICD-10-CM | POA: Diagnosis not present

## 2018-10-25 DIAGNOSIS — M5410 Radiculopathy, site unspecified: Secondary | ICD-10-CM | POA: Diagnosis not present

## 2018-10-25 DIAGNOSIS — G8929 Other chronic pain: Secondary | ICD-10-CM | POA: Diagnosis not present

## 2018-10-25 DIAGNOSIS — M5441 Lumbago with sciatica, right side: Secondary | ICD-10-CM | POA: Diagnosis not present

## 2018-11-15 ENCOUNTER — Other Ambulatory Visit (HOSPITAL_COMMUNITY): Payer: Self-pay | Admitting: Gastroenterology

## 2018-11-15 ENCOUNTER — Other Ambulatory Visit: Payer: Self-pay | Admitting: Gastroenterology

## 2018-11-15 DIAGNOSIS — R748 Abnormal levels of other serum enzymes: Secondary | ICD-10-CM

## 2018-11-15 DIAGNOSIS — K5900 Constipation, unspecified: Secondary | ICD-10-CM | POA: Diagnosis not present

## 2018-11-15 DIAGNOSIS — R140 Abdominal distension (gaseous): Secondary | ICD-10-CM | POA: Diagnosis not present

## 2018-11-15 DIAGNOSIS — Z86010 Personal history of colonic polyps: Secondary | ICD-10-CM | POA: Diagnosis not present

## 2018-11-29 ENCOUNTER — Other Ambulatory Visit: Payer: Self-pay

## 2018-11-29 ENCOUNTER — Ambulatory Visit
Admission: RE | Admit: 2018-11-29 | Discharge: 2018-11-29 | Disposition: A | Discharge status: Home / Self Care | Payer: BC Managed Care – PPO | Source: Ambulatory Visit | Attending: Gastroenterology | Admitting: Gastroenterology

## 2018-11-29 DIAGNOSIS — R748 Abnormal levels of other serum enzymes: Secondary | ICD-10-CM | POA: Diagnosis not present

## 2018-11-29 DIAGNOSIS — K760 Fatty (change of) liver, not elsewhere classified: Secondary | ICD-10-CM | POA: Diagnosis not present

## 2018-11-29 DIAGNOSIS — K7689 Other specified diseases of liver: Secondary | ICD-10-CM | POA: Diagnosis not present

## 2018-11-29 IMAGING — US ULTRASOUND ABDOMEN COMPLETE
1 series · 13 of 25 positions shown · non-contrast
Comparison: CT scan [DATE]

CLINICAL DATA: Elevated liver enzymes.

EXAM:
ABDOMEN ULTRASOUND COMPLETE

[Series 1: ultrasound abdomen complete · 0.20mm/px · 13 of 130 slices shown]
[im 1/130]
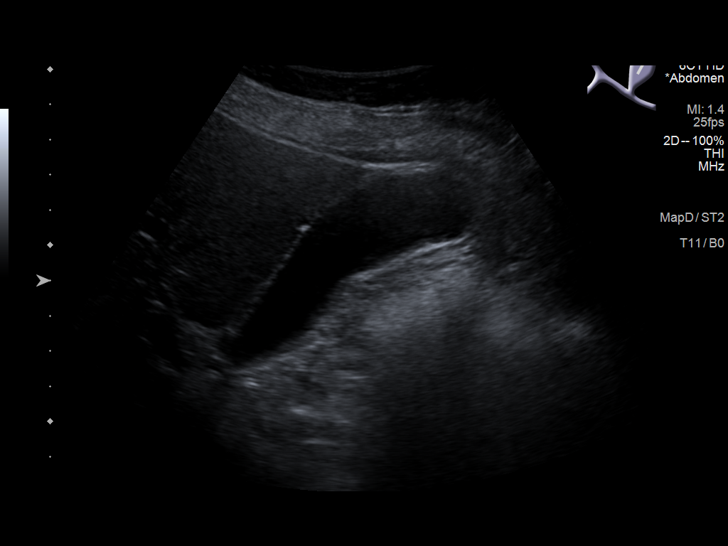
[im 11/130]
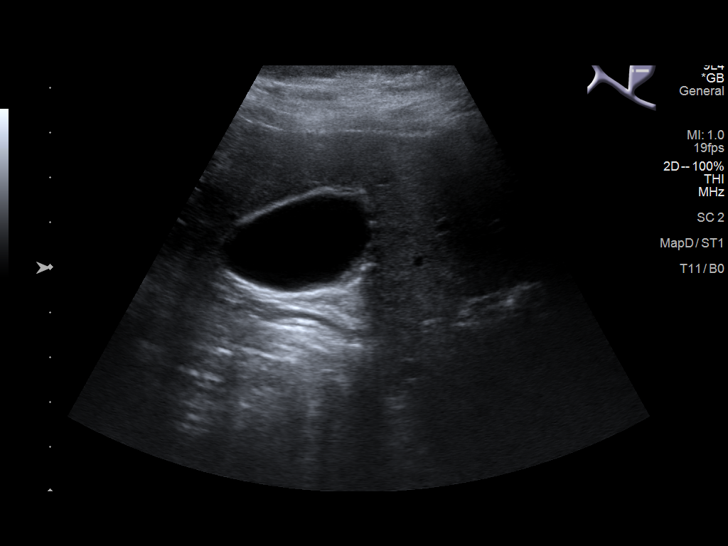
[im 22/130]
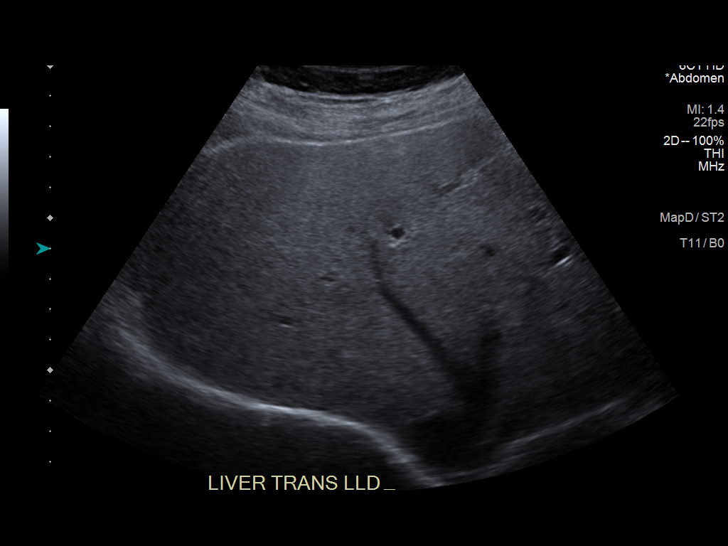
[im 33/130]
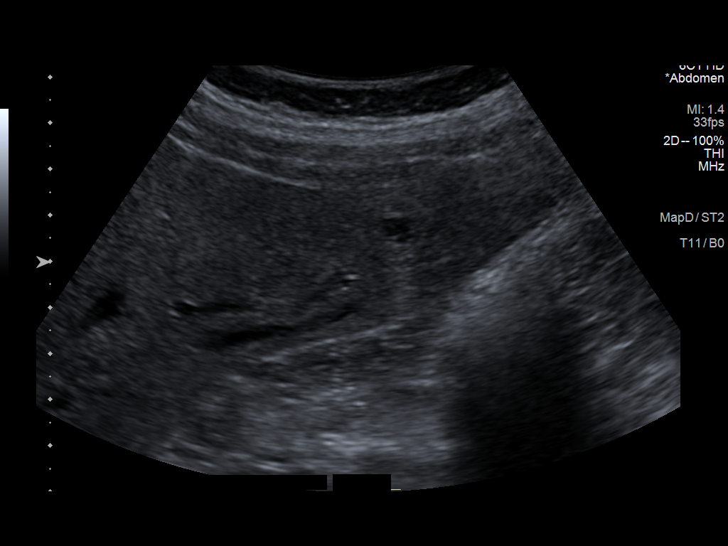
[im 44/130]
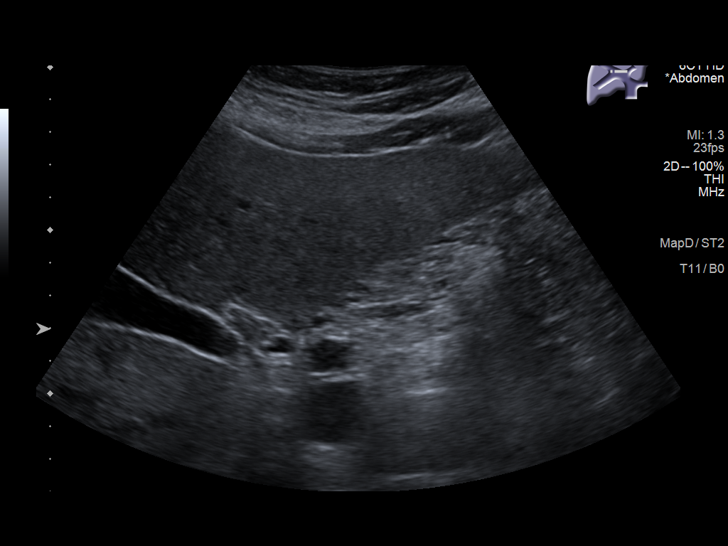
[im 54/130]
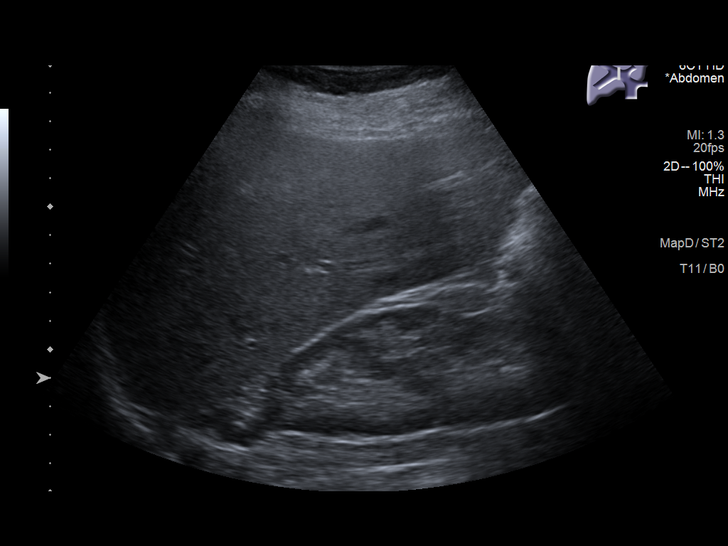
[im 65/130]
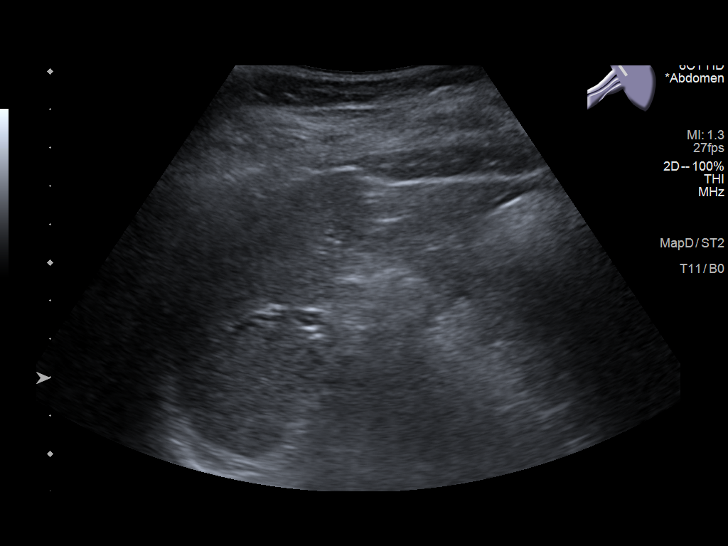
[im 76/130]
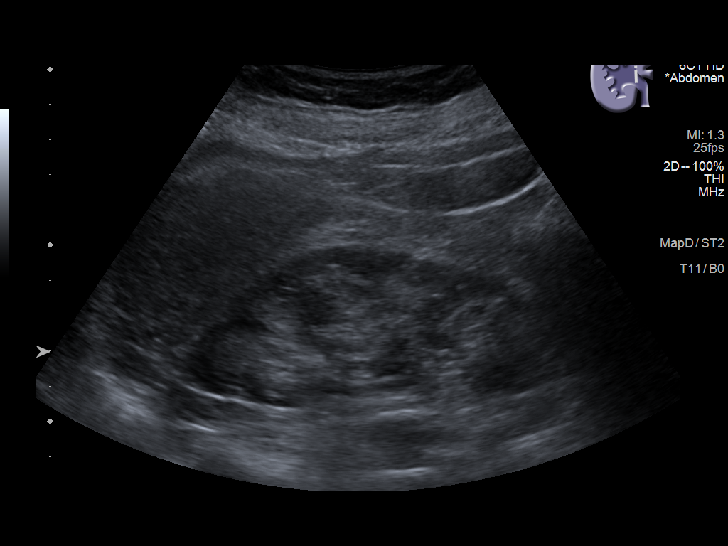
[im 87/130]
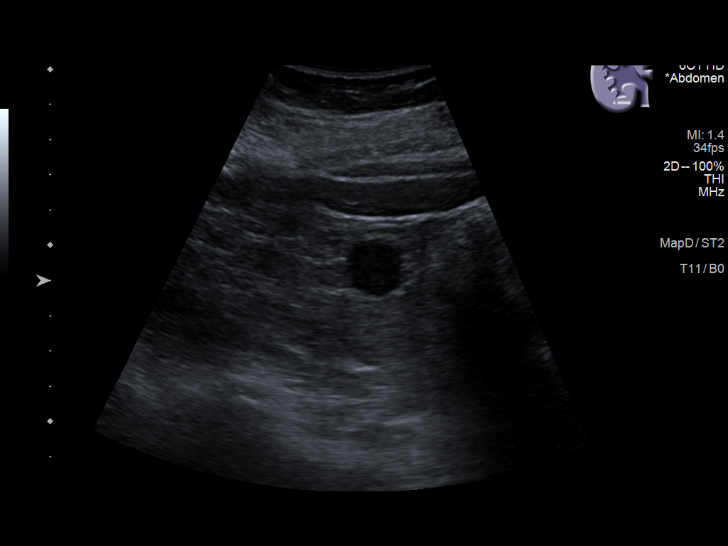
[im 97/130]
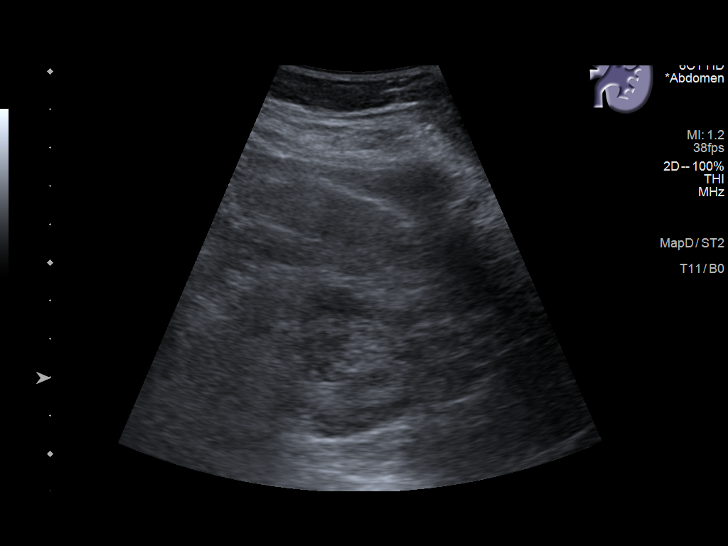
[im 108/130]
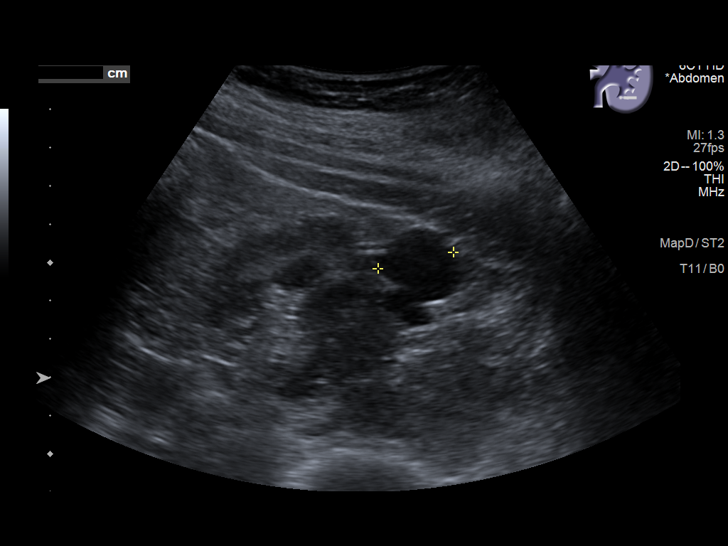
[im 119/130]
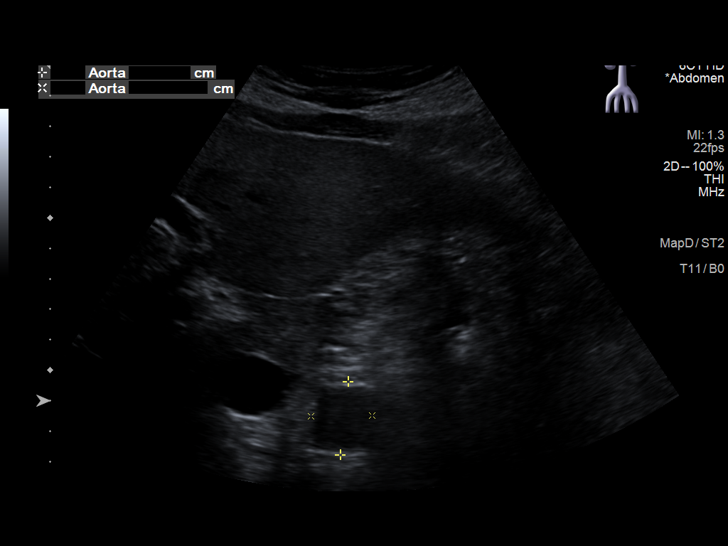
[im 130/130]
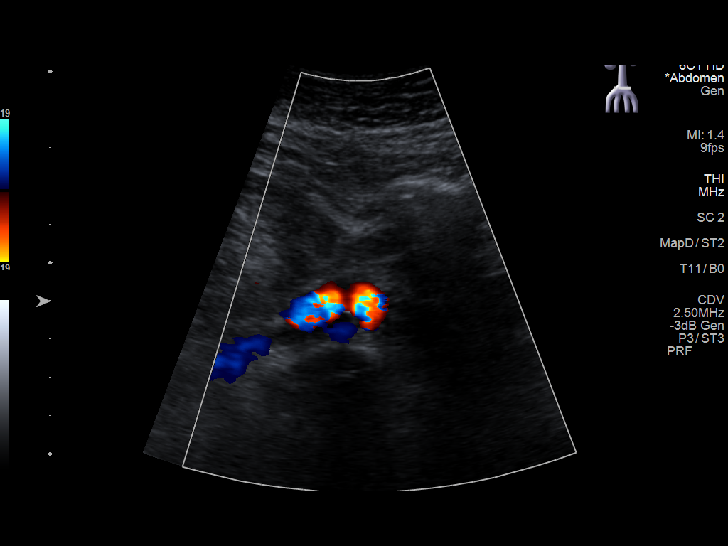

[13 of 25 positions shown; findings below may reference images not displayed]

FINDINGS: Gallbladder: No gallstones or wall thickening visualized. No
sonographic Murphy sign noted by sonographer.

Common bile duct: Diameter: 4.5 mm

Liver: There is diffuse increased echogenicity of the liver and
decreased through transmission consistent with fatty infiltration.
No focal lesions or biliary dilatation. Small hepatic cysts are
stable. Portal vein is patent on color Doppler imaging with normal
direction of blood flow towards the liver.

IVC: Normal caliber

Pancreas: Visualized portion unremarkable.

Spleen: Normal size.  No focal lesions.

Right Kidney: Length: 11.3 cm. Moderate renal cortical thinning but
normal echogenicity. Renal cysts are noted. The largest cyst
measures 1.6 x 1.9 x 1.3 cm in the lower pole region.

Left Kidney: Length: 11.2 cm. Moderate renal cortical thinning but
normal echogenicity. No hydronephrosis. Renal cysts are noted. The
largest cyst measures 2.4 x 1.9 x 2.0 cm in the lower pole region
laterally. There is also a 1.6 x 1.5 x 1.9 cm cyst in the midpole
region posteriorly. These correlate with the prior CT scan which
shows slightly complex/hemorrhagic cyst. No worrisome sonographic
lesions.

Abdominal aorta: No aneurysm visualized.

Other findings: None.
IMPRESSION: 1. Diffuse fatty infiltration of the liver and small hepatic cysts
but no worrisome hepatic lesions or intrahepatic biliary dilatation.
2. Bilateral renal cortical thinning and bilateral renal cysts.
3. Normal gallbladder and normal caliber common bile duct.

## 2018-12-05 DIAGNOSIS — F5105 Insomnia due to other mental disorder: Secondary | ICD-10-CM | POA: Diagnosis not present

## 2018-12-05 DIAGNOSIS — F3340 Major depressive disorder, recurrent, in remission, unspecified: Secondary | ICD-10-CM | POA: Diagnosis not present

## 2018-12-05 DIAGNOSIS — F1011 Alcohol abuse, in remission: Secondary | ICD-10-CM | POA: Diagnosis not present

## 2018-12-05 DIAGNOSIS — F411 Generalized anxiety disorder: Secondary | ICD-10-CM | POA: Diagnosis not present

## 2018-12-26 ENCOUNTER — Other Ambulatory Visit: Payer: Self-pay

## 2018-12-27 ENCOUNTER — Other Ambulatory Visit: Payer: Self-pay

## 2018-12-27 ENCOUNTER — Ambulatory Visit: Payer: BC Managed Care – PPO | Attending: Oncology

## 2018-12-27 ENCOUNTER — Ambulatory Visit
Admission: RE | Admit: 2018-12-27 | Discharge: 2018-12-27 | Disposition: A | Discharge status: Home / Self Care | Payer: BC Managed Care – PPO | Source: Ambulatory Visit | Attending: Oncology | Admitting: Oncology

## 2018-12-27 VITALS — BP 110/67 | HR 67 | Temp 97.3°F | Ht 62.5 in | Wt 142.9 lb

## 2018-12-27 DIAGNOSIS — Z0000 Encounter for general adult medical examination without abnormal findings: Secondary | ICD-10-CM | POA: Diagnosis not present

## 2018-12-27 IMAGING — MG DIGITAL SCREENING BILATERAL MAMMOGRAM WITH TOMO AND CAD
8 series · 9 of 24 positions shown · non-contrast
Comparison: Previous exam(s).

CLINICAL DATA: Screening.

EXAM:
DIGITAL SCREENING BILATERAL MAMMOGRAM WITH TOMO AND CAD

[L CC synth-2D]
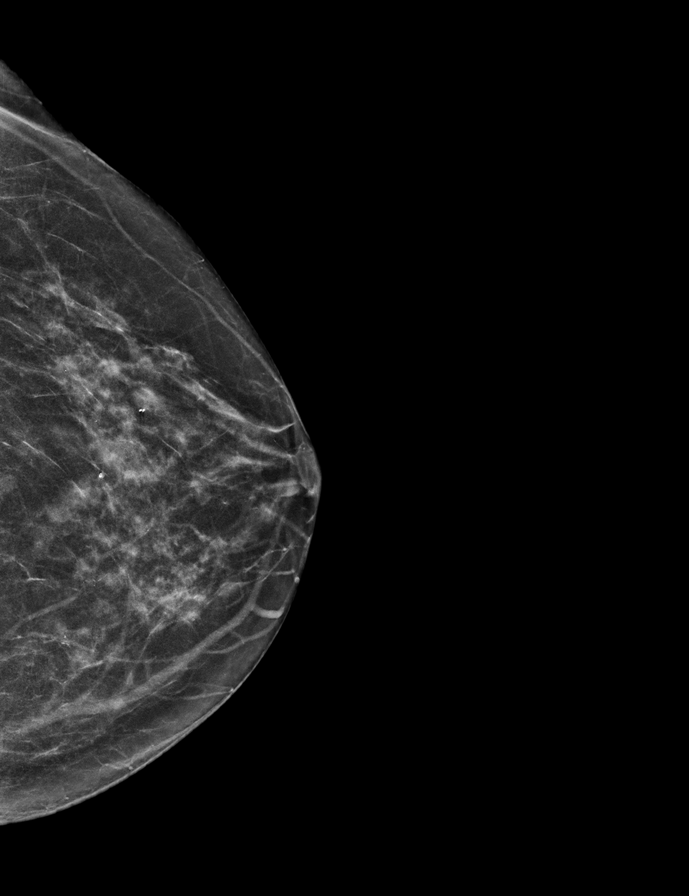

[R CC synth-2D]
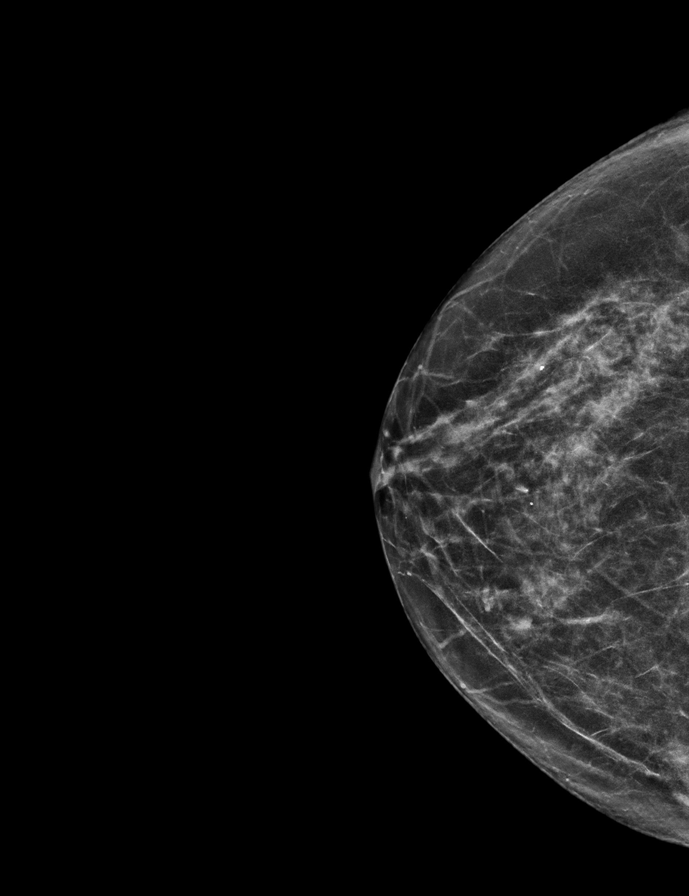

[L MLO synth-2D]
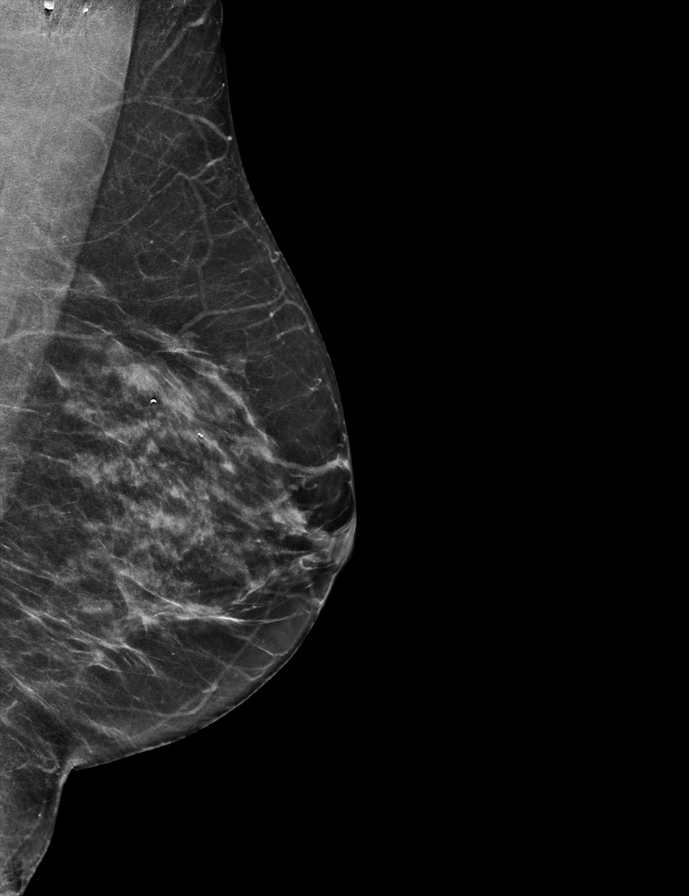

[R MLO synth-2D]
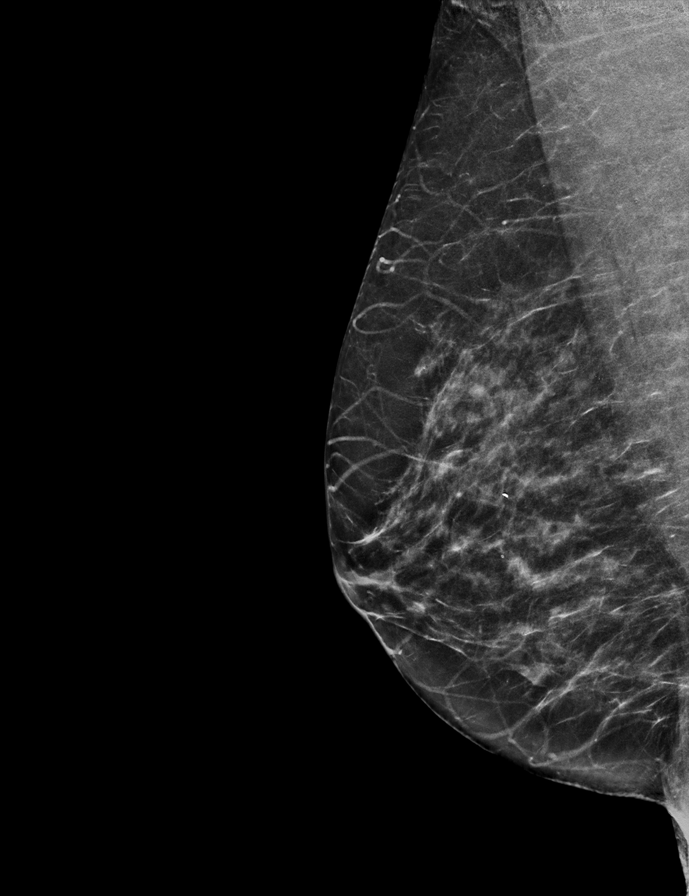

[L MLO tomo · 2 of 55 frames shown]
[frame 18/55]
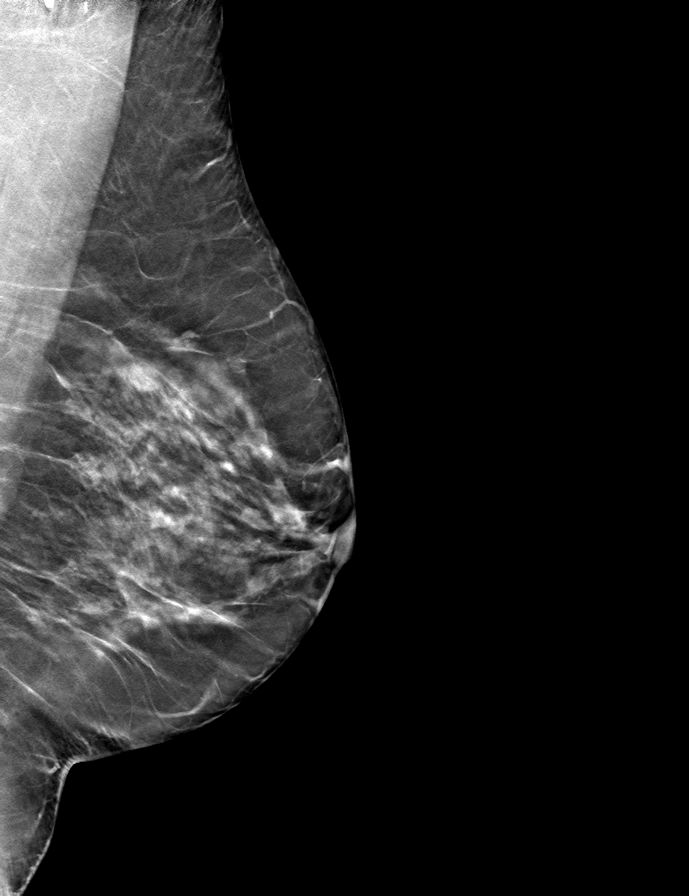
[frame 28/55]
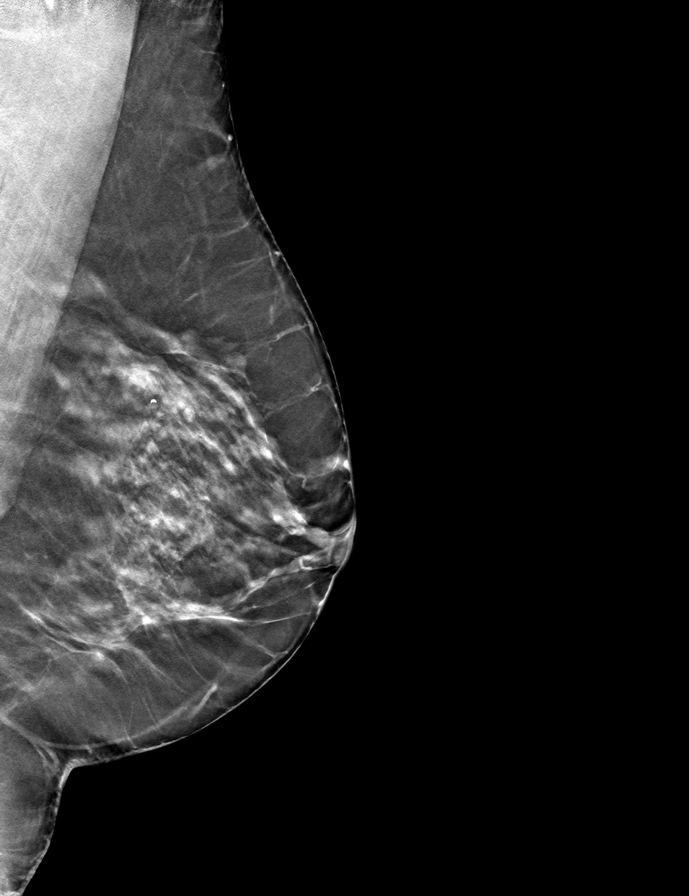

[R CC tomo · tomo slice 27/53.0]
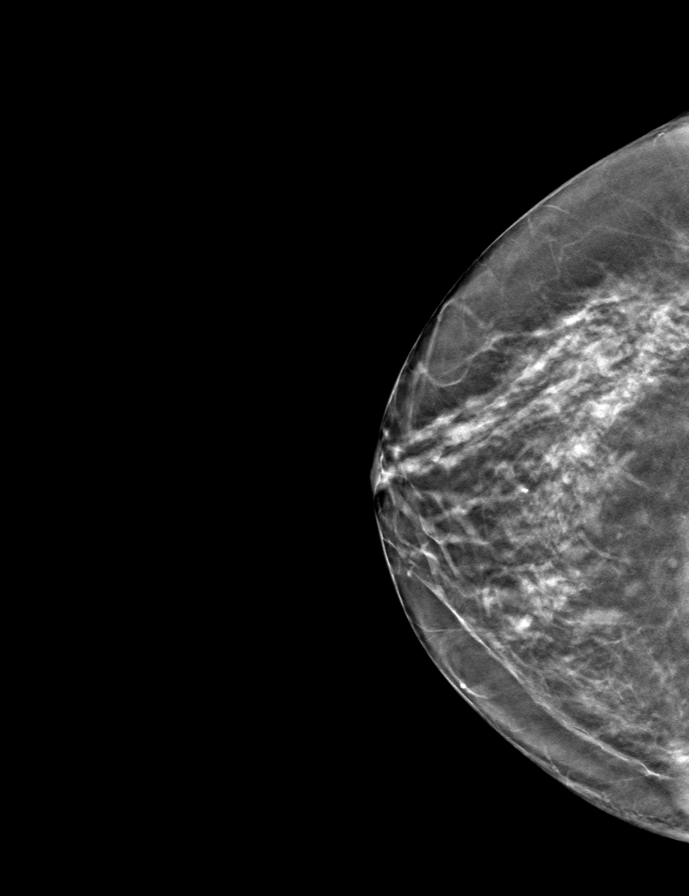

[L CC tomo · tomo slice 27/52.0]
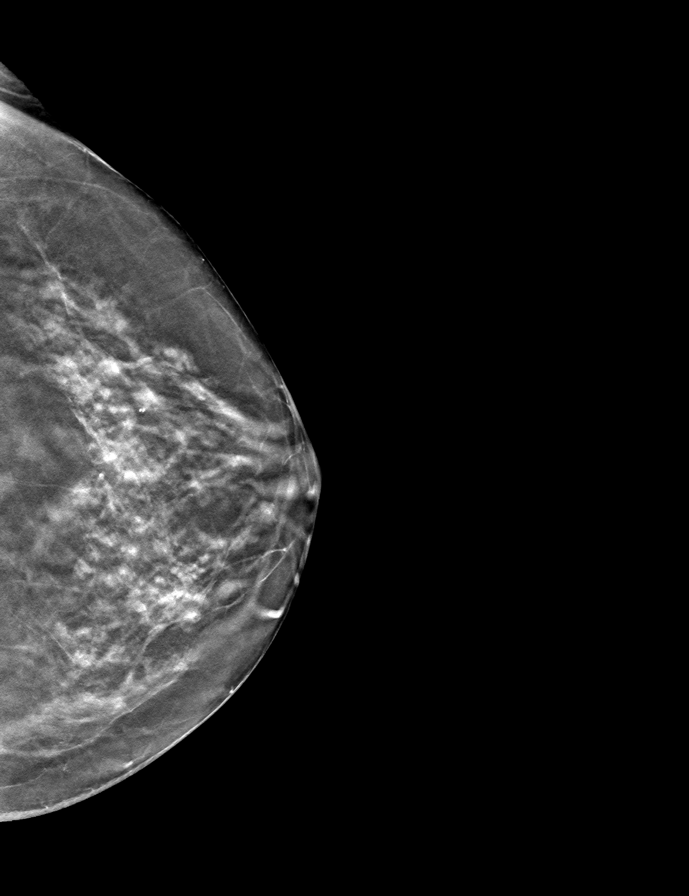

[R MLO tomo · tomo slice 28/55.0]
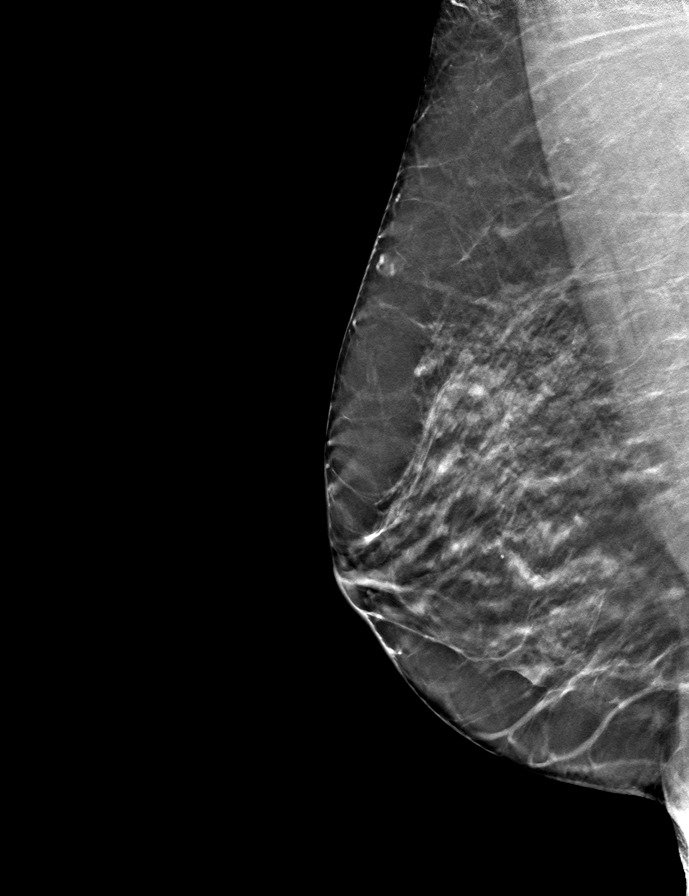

[9 of 24 positions shown; findings below may reference images not displayed]

ACR Breast Density Category c: The breast tissue is heterogeneously
dense, which may obscure small masses.
FINDINGS: There are no findings suspicious for malignancy. Images were
processed with CAD.
IMPRESSION: No mammographic evidence of malignancy. A result letter of this
screening mammogram will be mailed directly to the patient.

RECOMMENDATION:
Screening mammogram in one year. (Code:[5V])

BI-RADS CATEGORY  1: Negative.

## 2018-12-27 NOTE — Progress Notes (Signed)
  Subjective:     Patient ID: Regina Carroll, female   DOB: Oct 07, 1960, 58 y.o.   MRN: [id number]  HPI   Review of Systems     Objective:   Physical Exam Chest:     Breasts:        Right: No swelling, bleeding, inverted nipple, mass, nipple discharge, skin change or tenderness.        Left: No swelling, bleeding, inverted nipple, mass, nipple discharge, skin change or tenderness.       Comments: Left breast biopsy scar Genitourinary:    Labia:        Right: No rash, tenderness, lesion or injury.        Left: No rash, tenderness, lesion or injury.      Vagina: No signs of injury and foreign body. No vaginal discharge, erythema, tenderness, bleeding, lesions or prolapsed vaginal walls.     Cervix: No cervical motion tenderness, discharge, friability, lesion, erythema, cervical bleeding or eversion.     Uterus: Not deviated, not enlarged, not fixed, not tender and no uterine prolapse.      Adnexa:        Right: No mass, tenderness or fullness.         Left: No mass, tenderness or fullness.          Assessment:     58 year old patient returns for annual BCCCP screening, andfollow -up pap per Dr, Kenton Kingfisher' request.  Patient has [employer] but is underinsured per [employer], and meets income eligibility.  Instructed patient on breast self awareness using teach back method.  Clinical breast exam unremarkable.  No mass or lump palpated.  Pelvic exam normal.    Plan:     Sent for bilateral screening mammogram.  Specimen collected for pap.

## 2019-01-05 LAB — PAP LB AND HPV HIGH-RISK: HPV, high-risk: NEGATIVE

## 2019-01-26 DIAGNOSIS — R200 Anesthesia of skin: Secondary | ICD-10-CM | POA: Diagnosis not present

## 2019-01-26 DIAGNOSIS — Z8669 Personal history of other diseases of the nervous system and sense organs: Secondary | ICD-10-CM | POA: Diagnosis not present

## 2019-01-26 DIAGNOSIS — N644 Mastodynia: Secondary | ICD-10-CM | POA: Diagnosis not present

## 2019-01-26 DIAGNOSIS — M2550 Pain in unspecified joint: Secondary | ICD-10-CM | POA: Diagnosis not present

## 2019-02-12 DIAGNOSIS — I8311 Varicose veins of right lower extremity with inflammation: Secondary | ICD-10-CM | POA: Diagnosis not present

## 2019-02-12 DIAGNOSIS — I8312 Varicose veins of left lower extremity with inflammation: Secondary | ICD-10-CM | POA: Diagnosis not present

## 2019-02-12 DIAGNOSIS — I83813 Varicose veins of bilateral lower extremities with pain: Secondary | ICD-10-CM | POA: Diagnosis not present

## 2019-02-12 DIAGNOSIS — I83893 Varicose veins of bilateral lower extremities with other complications: Secondary | ICD-10-CM | POA: Diagnosis not present

## 2019-04-02 DIAGNOSIS — F5105 Insomnia due to other mental disorder: Secondary | ICD-10-CM | POA: Diagnosis not present

## 2019-04-02 DIAGNOSIS — F3340 Major depressive disorder, recurrent, in remission, unspecified: Secondary | ICD-10-CM | POA: Diagnosis not present

## 2019-04-02 DIAGNOSIS — F411 Generalized anxiety disorder: Secondary | ICD-10-CM | POA: Diagnosis not present

## 2019-04-02 DIAGNOSIS — F1011 Alcohol abuse, in remission: Secondary | ICD-10-CM | POA: Diagnosis not present

## 2019-04-24 DIAGNOSIS — J069 Acute upper respiratory infection, unspecified: Secondary | ICD-10-CM | POA: Diagnosis not present

## 2019-05-21 DIAGNOSIS — Z20828 Contact with and (suspected) exposure to other viral communicable diseases: Secondary | ICD-10-CM | POA: Diagnosis not present

## 2019-05-21 DIAGNOSIS — H9190 Unspecified hearing loss, unspecified ear: Secondary | ICD-10-CM | POA: Diagnosis not present

## 2019-05-21 DIAGNOSIS — F17210 Nicotine dependence, cigarettes, uncomplicated: Secondary | ICD-10-CM | POA: Diagnosis not present

## 2019-05-21 DIAGNOSIS — I10 Essential (primary) hypertension: Secondary | ICD-10-CM | POA: Diagnosis not present

## 2019-05-21 DIAGNOSIS — R001 Bradycardia, unspecified: Secondary | ICD-10-CM | POA: Diagnosis not present

## 2019-05-21 DIAGNOSIS — J40 Bronchitis, not specified as acute or chronic: Secondary | ICD-10-CM | POA: Diagnosis not present

## 2019-05-21 DIAGNOSIS — Z880 Allergy status to penicillin: Secondary | ICD-10-CM | POA: Diagnosis not present

## 2019-05-21 DIAGNOSIS — R52 Pain, unspecified: Secondary | ICD-10-CM | POA: Diagnosis not present

## 2019-05-21 DIAGNOSIS — J029 Acute pharyngitis, unspecified: Secondary | ICD-10-CM | POA: Diagnosis not present

## 2019-05-21 DIAGNOSIS — R05 Cough: Secondary | ICD-10-CM | POA: Diagnosis not present

## 2019-05-21 DIAGNOSIS — M791 Myalgia, unspecified site: Secondary | ICD-10-CM | POA: Diagnosis not present

## 2019-05-21 DIAGNOSIS — R079 Chest pain, unspecified: Secondary | ICD-10-CM | POA: Diagnosis not present

## 2019-05-21 DIAGNOSIS — Z885 Allergy status to narcotic agent status: Secondary | ICD-10-CM | POA: Diagnosis not present

## 2019-06-01 DIAGNOSIS — D2261 Melanocytic nevi of right upper limb, including shoulder: Secondary | ICD-10-CM | POA: Diagnosis not present

## 2019-06-01 DIAGNOSIS — D225 Melanocytic nevi of trunk: Secondary | ICD-10-CM | POA: Diagnosis not present

## 2019-06-01 DIAGNOSIS — B078 Other viral warts: Secondary | ICD-10-CM | POA: Diagnosis not present

## 2019-06-01 DIAGNOSIS — D2272 Melanocytic nevi of left lower limb, including hip: Secondary | ICD-10-CM | POA: Diagnosis not present

## 2019-06-01 DIAGNOSIS — R238 Other skin changes: Secondary | ICD-10-CM | POA: Diagnosis not present

## 2019-06-01 DIAGNOSIS — D485 Neoplasm of uncertain behavior of skin: Secondary | ICD-10-CM | POA: Diagnosis not present

## 2019-06-01 DIAGNOSIS — D2262 Melanocytic nevi of left upper limb, including shoulder: Secondary | ICD-10-CM | POA: Diagnosis not present

## 2019-06-07 DIAGNOSIS — D0471 Carcinoma in situ of skin of right lower limb, including hip: Secondary | ICD-10-CM | POA: Diagnosis not present

## 2019-07-23 ENCOUNTER — Ambulatory Visit: Admit: 2019-07-23 | Payer: BC Managed Care – PPO | Admitting: Internal Medicine

## 2019-07-23 SURGERY — COLONOSCOPY WITH PROPOFOL
Anesthesia: General

## 2019-09-27 ENCOUNTER — Other Ambulatory Visit: Payer: Self-pay

## 2019-09-27 ENCOUNTER — Encounter: Payer: Self-pay | Admitting: Emergency Medicine

## 2019-09-27 ENCOUNTER — Emergency Department
Admission: EM | Admit: 2019-09-27 | Discharge: 2019-09-27 | Disposition: A | Discharge status: Home / Self Care | Payer: BC Managed Care – PPO | Attending: Emergency Medicine | Admitting: Emergency Medicine

## 2019-09-27 ENCOUNTER — Emergency Department: Payer: BC Managed Care – PPO

## 2019-09-27 DIAGNOSIS — J449 Chronic obstructive pulmonary disease, unspecified: Secondary | ICD-10-CM | POA: Insufficient documentation

## 2019-09-27 DIAGNOSIS — R079 Chest pain, unspecified: Secondary | ICD-10-CM | POA: Diagnosis present

## 2019-09-27 DIAGNOSIS — I10 Essential (primary) hypertension: Secondary | ICD-10-CM | POA: Insufficient documentation

## 2019-09-27 DIAGNOSIS — Z79899 Other long term (current) drug therapy: Secondary | ICD-10-CM | POA: Insufficient documentation

## 2019-09-27 DIAGNOSIS — F17210 Nicotine dependence, cigarettes, uncomplicated: Secondary | ICD-10-CM | POA: Insufficient documentation

## 2019-09-27 DIAGNOSIS — J189 Pneumonia, unspecified organism: Secondary | ICD-10-CM | POA: Insufficient documentation

## 2019-09-27 LAB — URINALYSIS, COMPLETE (UACMP) WITH MICROSCOPIC
Bacteria, UA: NONE SEEN
Bilirubin Urine: NEGATIVE
Glucose, UA: NEGATIVE mg/dL
Hgb urine dipstick: NEGATIVE
Ketones, ur: NEGATIVE mg/dL
Leukocytes,Ua: NEGATIVE
Nitrite: NEGATIVE
Protein, ur: NEGATIVE mg/dL
Specific Gravity, Urine: 1.01 (ref 1.005–1.030)
pH: 7 (ref 5.0–8.0)

## 2019-09-27 LAB — CBC
HCT: 39 % (ref 36.0–46.0)
Hemoglobin: 13.3 g/dL (ref 12.0–15.0)
MCH: 29.2 pg (ref 26.0–34.0)
MCHC: 34.1 g/dL (ref 30.0–36.0)
MCV: 85.5 fL (ref 80.0–100.0)
Platelets: 336 10*3/uL (ref 150–400)
RBC: 4.56 MIL/uL (ref 3.87–5.11)
RDW: 12.5 % (ref 11.5–15.5)
WBC: 8.1 10*3/uL (ref 4.0–10.5)
nRBC: 0 % (ref 0.0–0.2)

## 2019-09-27 LAB — BASIC METABOLIC PANEL
Anion gap: 9 (ref 5–15)
BUN: 16 mg/dL (ref 8–23)
CO2: 24 mmol/L (ref 22–32)
Calcium: 9.5 mg/dL (ref 8.9–10.3)
Chloride: 105 mmol/L (ref 98–111)
Creatinine, Ser: 0.7 mg/dL (ref 0.44–1.00)
GFR calc Af Amer: >60 mL/min (ref >60)
GFR calc non Af Amer: >60 mL/min (ref >60)
Glucose, Bld: 95 mg/dL (ref 70–99)
Potassium: 3.5 mmol/L (ref 3.5–5.1)
Sodium: 138 mmol/L (ref 135–145)

## 2019-09-27 LAB — TROPONIN I (HIGH SENSITIVITY)
Troponin I (High Sensitivity): 4 ng/L (ref <18)
Troponin I (High Sensitivity): 4 ng/L (ref <18)

## 2019-09-27 IMAGING — CR DG CHEST 2V
2 series · 2 of 2 positions shown · non-contrast
Comparison: [DATE]

CLINICAL DATA: Chest pain, pain across the entire chest.

EXAM:
CHEST - 2 VIEW

[chest pa]
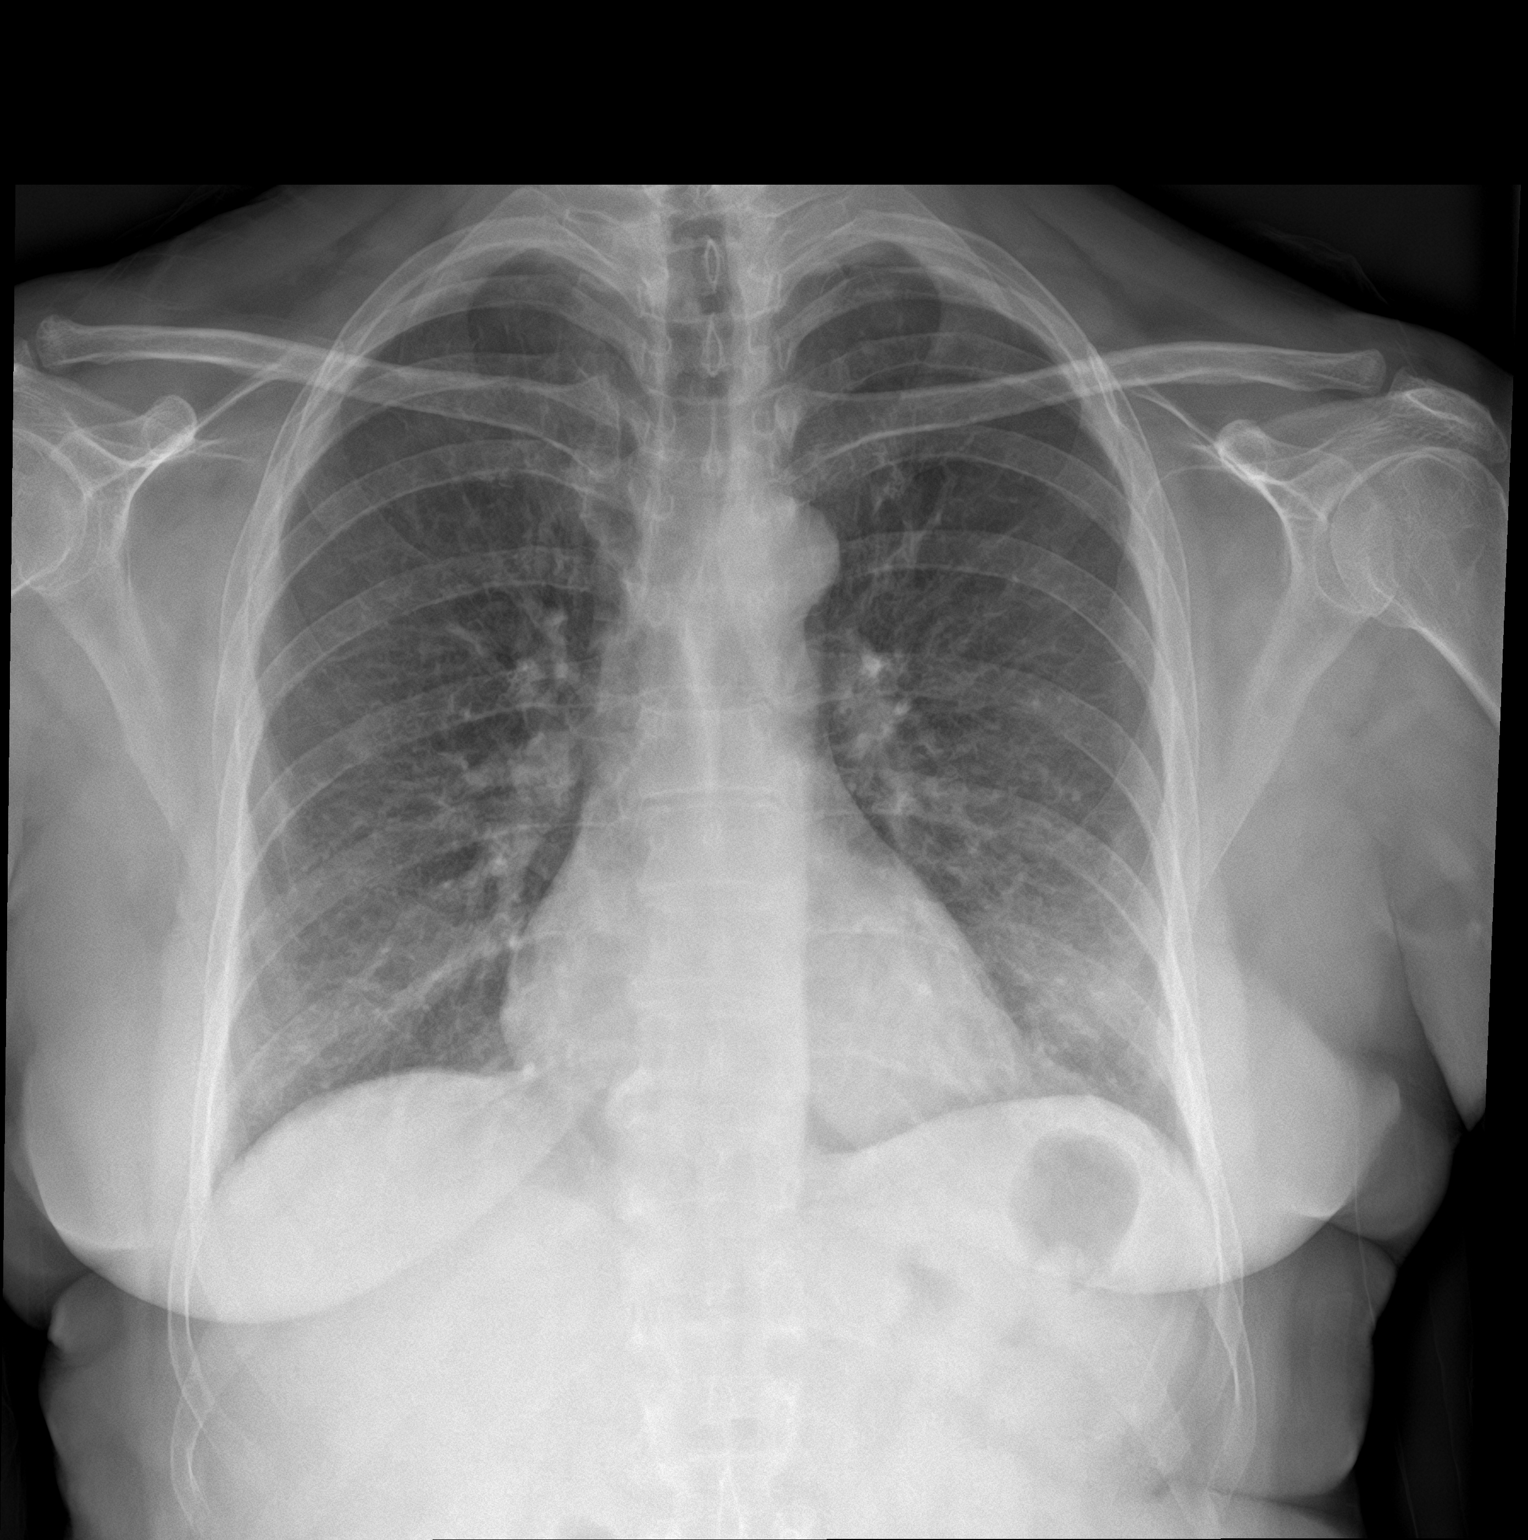

[chest lat]
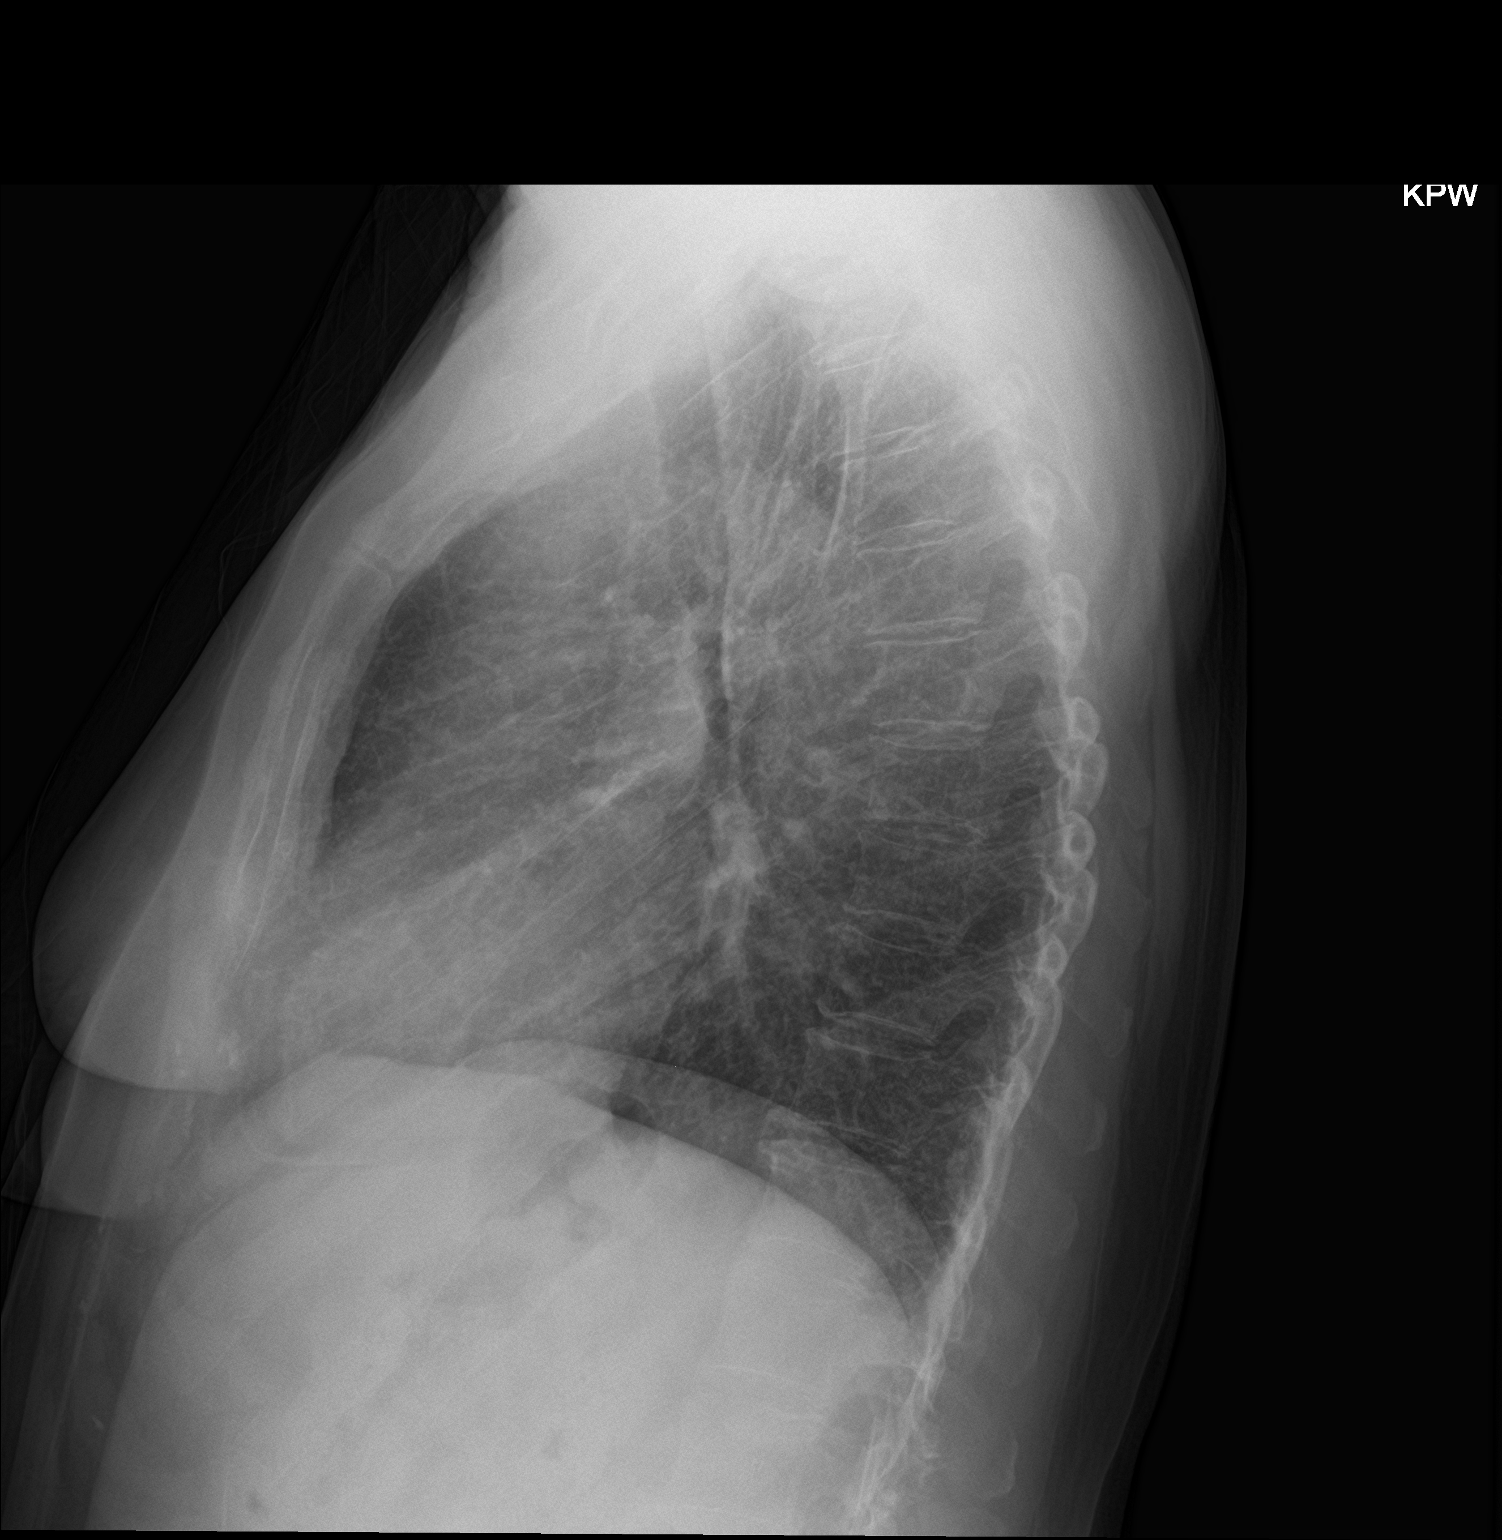

[2 of 2 positions shown; findings below may reference images not displayed]

FINDINGS: Cardiomediastinal contours and hilar structures are normal.

Added density at the left lung base is subtle. Best seen to the left
of the heart border.

No pleural effusion. No dense consolidation.

Osteopenia and spinal degenerative changes. No acute bone finding.
IMPRESSION: Subtle peripheral opacity at the left lung base. May represent
developing infection, new since [L2]. Follow-up is suggested to
ensure resolution.

## 2019-09-27 MED ORDER — AZITHROMYCIN 500 MG PO TABS
500.0000 mg | ORAL_TABLET | Freq: Once | ORAL | Status: AC
  Administered 2019-09-27: 13:00:00 500 mg via ORAL
  Filled 2019-09-27: qty 1

## 2019-09-27 MED ORDER — AZITHROMYCIN 250 MG PO TABS: 250.0000 mg | ORAL_TABLET | Freq: Every day | ORAL | 0 refills | Status: AC

## 2019-09-27 MED ORDER — ALBUTEROL SULFATE HFA 108 (90 BASE) MCG/ACT IN AERS: 2.0000 | INHALATION_SPRAY | Freq: Four times a day (QID) | RESPIRATORY_TRACT | 0 refills | Status: AC | PRN

## 2019-09-27 NOTE — Discharge Instructions (Addendum)
You are being treated for a pneumonia. Take the prescription meds as directed. Rest and drink plenty of fluids. Follow-up with your provider for repeat chest XR in May, to confirm resolution. Return to the ED as needed.

## 2019-09-27 NOTE — ED Triage Notes (Signed)
Pt here for chest pain across whole chest and into neck and both arms.  Pain is constant today. Sx started 2 days ago.  Has felt weak all over as well.  + nausea, no vomiting/diarrhea.  When asked pt admits to increased urinary frequency.

## 2019-09-27 NOTE — ED Notes (Signed)
See triage note  Presents with some chest  Discomfort  States she has not felt good for couple of days   Yesterday she had some discomfort across chest and into both arms  States pain was intermittent yesterday  But has been constant this am  No fever or cough

## 2019-09-27 NOTE — ED Provider Notes (Signed)
[facility] Emergency Department Provider Note ____________________________________________  Time seen: 1302  I have reviewed the triage vital signs and the nursing notes.  HISTORY  Chief Complaint  Chest Pain  HPI Regina Carroll is a 59 y.o. female presents to the ED for evaluation of generalized chest pain, but left greater than right. Patient also reports a mild cough as well as some shortness of breath. She denies any high risk exposures, recent travel, or other concerns. She was tested about a month ago for Covid and that was negative. Reports symptom started about 2 days ago as she has felt generally weak and has had nausea without vomiting or diarrhea, and denies fevers, chills, or sweats . She also reports some urinary frequency but denies any dysuria, hematuria, or urinary retention. She is a current everyday smoker, and previously had an inhaler which has expired.  Past Medical History:  Diagnosis Date  . Abnormal cervical cytology   . Adjustment reaction with anxiety and depression   . Anxiety   . COPD (chronic obstructive pulmonary disease) ([place])   . Depression   . Diarrhea   . Gonorrhea   . Gout   . History of acute PID   . Hx of trichomoniasis   . Hypertension   . Plantar fasciitis     Patient Active Problem List   Diagnosis Date Noted  . CIN I (cervical intraepithelial neoplasia I) 08/16/2017  . HPV in female 02/01/2017    Past Surgical History:  Procedure Laterality Date  . BREAST EXCISIONAL BIOPSY Left 02/17/2009   neg  . breast mass removed    . CESAREAN SECTION Bilateral unk  . COLONOSCOPY WITH PROPOFOL N/A 09/28/2016   Procedure: COLONOSCOPY WITH PROPOFOL;  Surgeon: Lollie Sails, MD;  Location: [facility] ENDOSCOPY;  Service: Endoscopy;  Laterality: N/A;  . ESOPHAGOGASTRODUODENOSCOPY (EGD) WITH PROPOFOL N/A 09/28/2016   Procedure: ESOPHAGOGASTRODUODENOSCOPY (EGD) WITH PROPOFOL;  Surgeon: Lollie Sails, MD;  Location: [facility]  ENDOSCOPY;  Service: Endoscopy;  Laterality: N/A;  . TUBAL LIGATION      Prior to Admission medications   Medication Sig Start Date End Date Taking? Authorizing Provider  albuterol (PROVENTIL HFA;VENTOLIN HFA) 108 (90 Base) MCG/ACT inhaler Inhale 2 puffs into the lungs every 6 (six) hours as needed for wheezing or shortness of breath. 06/20/15   Baily Serpe, Dannielle Karvonen, PA-C  albuterol (VENTOLIN HFA) 108 (90 Base) MCG/ACT inhaler Inhale 2 puffs into the lungs every 6 (six) hours as needed. 09/27/19   Antonyo Hinderer, Dannielle Karvonen, PA-C  allopurinol (ZYLOPRIM) 100 MG tablet Take 100 mg by mouth daily. 05/10/18   [provider]  atenolol (TENORMIN) 25 MG tablet Take 25 mg by mouth daily.     [provider]  atorvastatin (LIPITOR) 20 MG tablet Take 20 mg by mouth daily.    [provider]  azithromycin (ZITHROMAX Z-PAK) 250 MG tablet Take 1 tablet (250 mg total) by mouth daily for 4 days. 09/28/19 10/02/19  Kebron Pulse, Dannielle Karvonen, PA-C  buPROPion (WELLBUTRIN XL) 150 MG 24 hr tablet Take 150 mg by mouth daily.    [provider]  citalopram (CELEXA) 20 MG tablet Take 20 mg by mouth daily. 11/29/17   [provider]  colchicine 0.6 MG tablet Take 0.6 mg by mouth daily as needed.    [provider]  hydrochlorothiazide (HYDRODIURIL) 25 MG tablet Take 25 mg by mouth daily.    [provider]  ibuprofen (ADVIL,MOTRIN) 800 MG tablet Take 1  tablet (800 mg total) by mouth every 8 (eight) hours as needed for moderate pain. 11/20/14   Sable Feil, PA-C  omeprazole (PRILOSEC OTC) 20 MG tablet Take 20 mg by mouth daily.     [provider]  pantoprazole (PROTONIX) 40 MG tablet Take 1 tablet (40 mg total) by mouth daily. 05/30/18 05/30/19  Earleen Newport, MD  Potassium Gluconate 595 MG TBCR Take 595 mg by mouth daily.     [provider]  sucralfate (CARAFATE) 1 g tablet Take 1 tablet (1 g total) by mouth 4 (four) times daily.  05/30/18 05/30/19  Earleen Newport, MD  SUMAtriptan (IMITREX) 50 MG tablet Take 50 mg by mouth every 2 (two) hours as needed for migraine. May repeat in 2 hours if headache persists or recurs.    [provider]  tiotropium (SPIRIVA) 18 MCG inhalation capsule Place 18 mcg into inhaler and inhale daily.    [provider]  traZODone (DESYREL) 150 MG tablet Take 150 mg by mouth at bedtime as needed. 05/11/18   [provider]  vitamin B-12 (CYANOCOBALAMIN) 100 MCG tablet Take by mouth daily.    [provider]    Allergies Codeine and Penicillins  Family History  Problem Relation Age of Onset  . Breast cancer Maternal Grandmother     Social History Social History   Tobacco Use  . Smoking status: Current Every Day Smoker    Packs/day: 1.50    Years: 25.00    Pack years: 37.50  . Smokeless tobacco: Never Used  Substance Use Topics  . Alcohol use: No    Comment: recovering alcoholic.   . Drug use: No    Review of Systems  Constitutional: Negative for fever. Reports generalized weakness Eyes: Negative for visual changes. ENT: Negative for sore throat. Cardiovascular: Positive for chest pain. Respiratory: Positive for shortness of breath. Gastrointestinal: Negative for abdominal pain, vomiting and diarrhea. Genitourinary: Negative for dysuria. Musculoskeletal: Negative for back pain. Skin: Negative for rash. Neurological: Negative for headaches, focal weakness or numbness. ____________________________________________  PHYSICAL EXAM:  VITAL SIGNS: ED Triage Vitals  Enc Vitals Group     BP 09/27/19 1114 (!) 114/57     Pulse Rate 09/27/19 1114 (!) 59     Resp 09/27/19 1114 20     Temp 09/27/19 1114 98 F (36.7 C)     Temp Source 09/27/19 1114 Oral     SpO2 09/27/19 1114 98 %     Weight 09/27/19 1115 140 lb (63.5 kg)     Height 09/27/19 1115 5\' 1"  (1.549 m)     Head Circumference --      Peak Flow --      Pain Score 09/27/19  1114 9     Pain Loc --      Pain Edu? --      Excl. in [place]? --     Constitutional: Alert and oriented. Well appearing and in no distress. Head: Normocephalic and atraumatic. Eyes: Conjunctivae are normal. Normal extraocular movements Cardiovascular: Normal rate, regular rhythm. No murmurs, rubs, or gallops. Normal distal pulses. Respiratory: Normal respiratory effort. No wheezes/rales/rhonchi. Gastrointestinal: Soft and nontender. No distention. Musculoskeletal: Nontender with normal range of motion in all extremities.  Neurologic:  Normal gait without ataxia. Normal speech and language. No gross focal neurologic deficits are appreciated. Skin:  Skin is warm, dry and intact. No rash noted. ____________________________________________   LABS (pertinent positives/negatives) Labs Reviewed  URINALYSIS, COMPLETE (UACMP) WITH MICROSCOPIC - Abnormal; Notable  for the following components:      Result Value   Color, Urine YELLOW (*)    APPearance CLEAR (*)    All other components within normal limits  BASIC METABOLIC PANEL  CBC  TROPONIN I (HIGH SENSITIVITY)  TROPONIN I (HIGH SENSITIVITY)  ____________________________________________  EKG  NSR brady 56 bpm PR interval 146 ms QRS duration 80 ms No STEMI Normal axis ____________________________________________   RADIOLOGY  CXR  IMPRESSION: Subtle peripheral opacity at the left lung base. May represent developing infection, new since 2019. Follow-up is suggested to ensure resolution. ____________________________________________  PROCEDURES  Azithromycin 500 mg PO  Procedures ____________________________________________  INITIAL IMPRESSION / ASSESSMENT AND PLAN / ED COURSE  Differential diagnosis includes, but is not limited to, ACS, aortic dissection, pulmonary embolism, cardiac tamponade, pneumothorax, pneumonia, pericarditis, myocarditis, GI-related causes including esophagitis/gastritis, and musculoskeletal chest wall  pain.    Patient with ED evaluation of left-sided chest wall pain as well as generalized malaise and fatigue. Patient with exam without signs of acute respiratory distress or dehydration. No laboratory evidence of AKI or sepsis. Chest x-ray reveals a developing left lower lobe opacity concerning for early pneumonia. Troponin is negative x2 and EKG did not reveal any acute T changes. Patient be treated empirically with azithromycin and discharged with a protection for the same as well as albuterol. She will follow-up with primary provider for ongoing symptoms and return to the ED if necessary.  Regina Carroll was evaluated in Emergency Department on 09/27/2019 for the symptoms described in the history of present illness. She was evaluated in the context of the global COVID-19 pandemic, which necessitated consideration that the patient might be at risk for infection with the SARS-CoV-2 virus that causes COVID-19. Institutional protocols and algorithms that pertain to the evaluation of patients at risk for COVID-19 are in a state of rapid change based on information released by regulatory bodies including the CDC and federal and state organizations. These policies and algorithms were followed during the patient's care in the ED. ____________________________________________  FINAL CLINICAL IMPRESSION(S) / ED DIAGNOSES  Final diagnoses:  Community acquired pneumonia of left lower lobe of lung      Timmi Devora, Dannielle Karvonen, PA-C 09/27/19 1443    Duffy Bruce, MD 09/30/19 1207

## 2019-11-16 ENCOUNTER — Other Ambulatory Visit
Admission: RE | Admit: 2019-11-16 | Discharge: 2019-11-16 | Disposition: A | Discharge status: Home / Self Care | Payer: BC Managed Care – PPO | Source: Ambulatory Visit | Attending: General Surgery | Admitting: General Surgery

## 2019-11-16 ENCOUNTER — Other Ambulatory Visit: Payer: Self-pay | Admitting: Gastroenterology

## 2019-11-16 ENCOUNTER — Other Ambulatory Visit: Payer: Self-pay

## 2019-11-16 ENCOUNTER — Ambulatory Visit
Admission: RE | Admit: 2019-11-16 | Discharge: 2019-11-16 | Disposition: A | Discharge status: Home / Self Care | Payer: BC Managed Care – PPO | Source: Ambulatory Visit | Attending: Gastroenterology | Admitting: Gastroenterology

## 2019-11-16 DIAGNOSIS — I700 Atherosclerosis of aorta: Secondary | ICD-10-CM | POA: Diagnosis not present

## 2019-11-16 DIAGNOSIS — R1033 Periumbilical pain: Secondary | ICD-10-CM

## 2019-11-16 DIAGNOSIS — R110 Nausea: Secondary | ICD-10-CM | POA: Diagnosis present

## 2019-11-16 DIAGNOSIS — Z20822 Contact with and (suspected) exposure to covid-19: Secondary | ICD-10-CM | POA: Insufficient documentation

## 2019-11-16 DIAGNOSIS — Z01818 Encounter for other preprocedural examination: Secondary | ICD-10-CM | POA: Insufficient documentation

## 2019-11-16 LAB — SARS CORONAVIRUS 2 (TAT 6-24 HRS): SARS Coronavirus 2: NEGATIVE

## 2019-11-16 LAB — POCT I-STAT CREATININE: Creatinine, Ser: 0.7 mg/dL (ref 0.44–1.00)

## 2019-11-16 IMAGING — CT CT ABD-PELV W/ CM
2 of 5 series · 16 of 46 positions shown, 18 images · IV contrast (omnipaque)
Comparison: [DATE] CT abdomen/pelvis.

CLINICAL DATA: Periumbilical abdominal pain and nausea, chronic,
for over 1 year.

EXAM:
CT ABDOMEN AND PELVIS WITH CONTRAST
TECHNIQUE: Multidetector CT imaging of the abdomen and pelvis was performed
using the standard protocol following bolus administration of
intravenous contrast.
CONTRAST:  100mL OMNIPAQUE IOHEXOL 300 MG/ML  SOLN

[Series 2: abd pelvis 5.00 · axial · 0.75mm/px · z∈[-1501,-1106]mm · 13 of 89 slices shown, 15 images]
[im 5/89  soft-tissue]
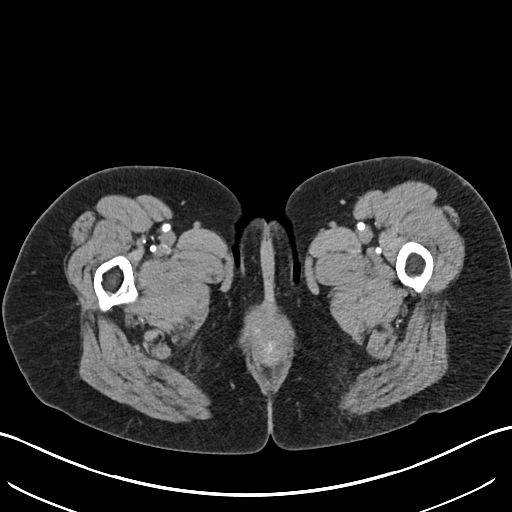
[im 5/89  bone]
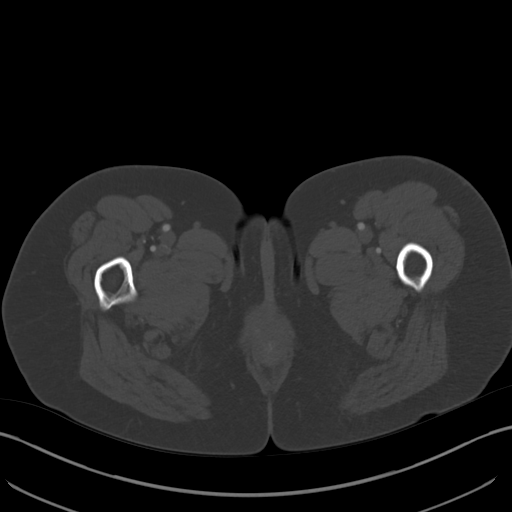
[im 14/89  soft-tissue]
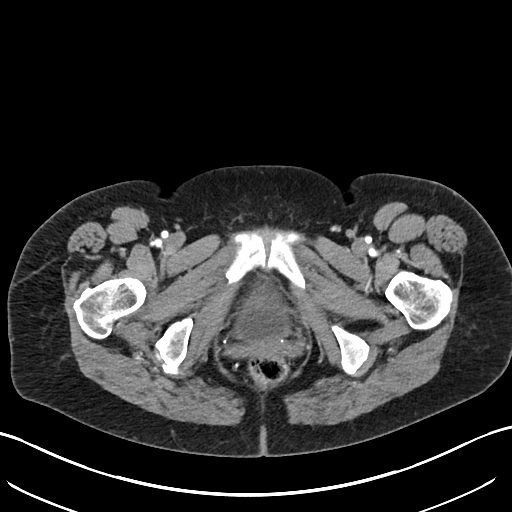
[im 19/89  soft-tissue]
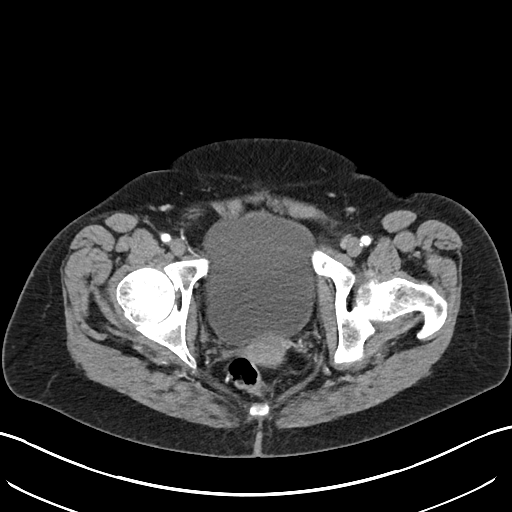
[im 24/89  soft-tissue]
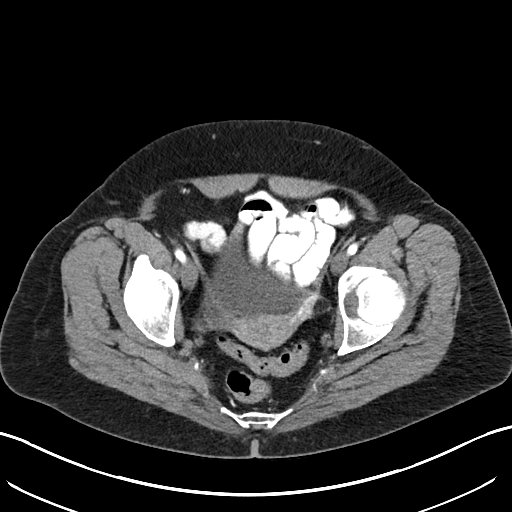
[im 33/89  soft-tissue]
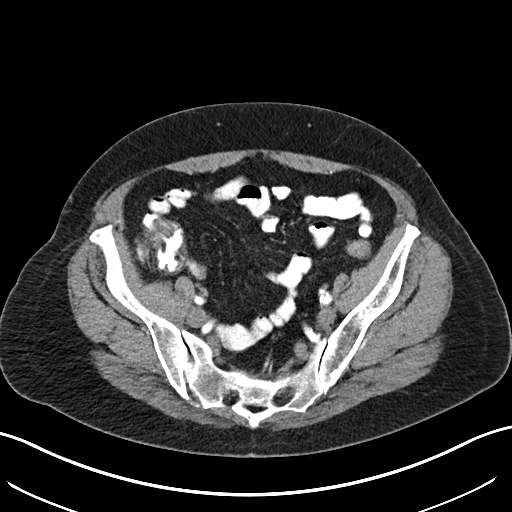
[im 38/89  soft-tissue]
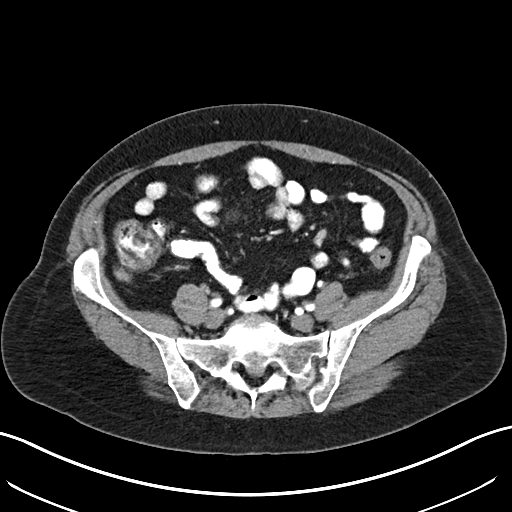
[im 47/89  soft-tissue]
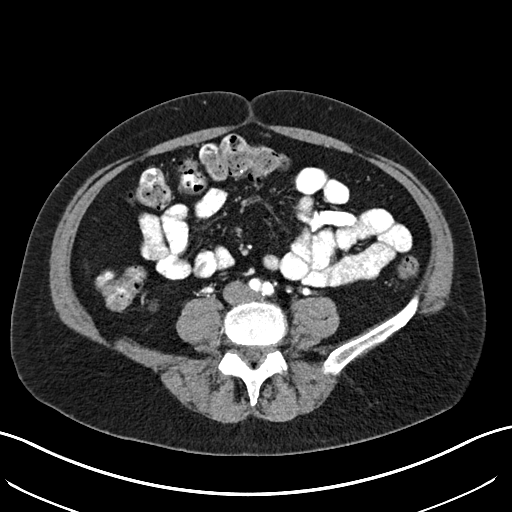
[im 51/89  soft-tissue]
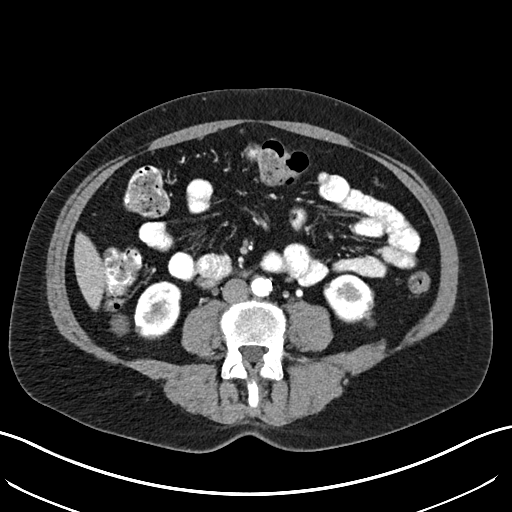
[im 56/89  soft-tissue]
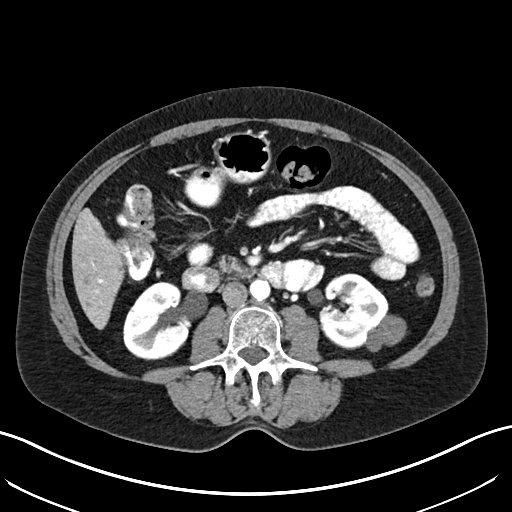
[im 56/89  bone]
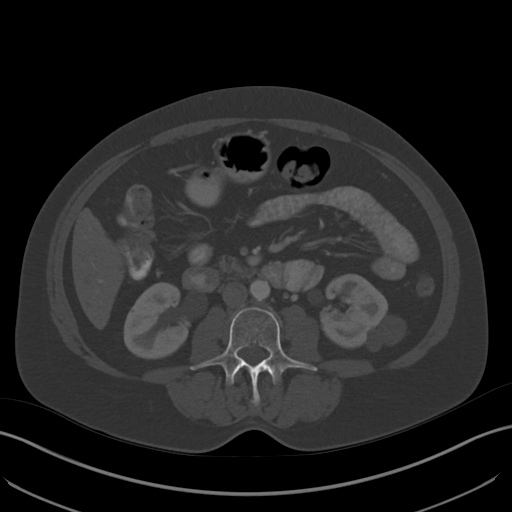
[im 65/89  soft-tissue]
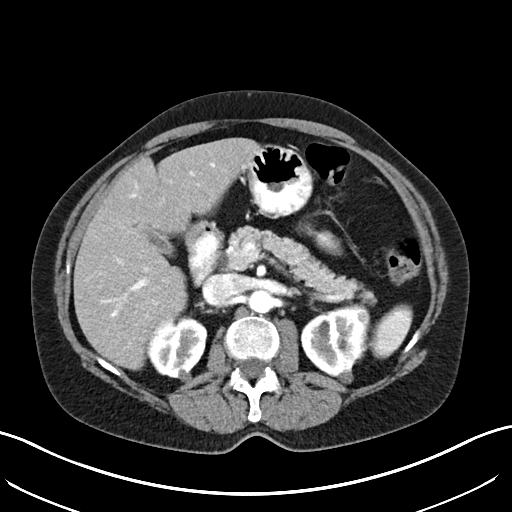
[im 70/89  soft-tissue]
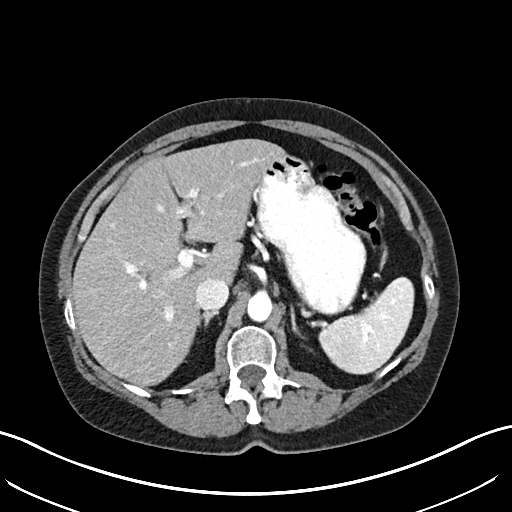
[im 75/89  soft-tissue]
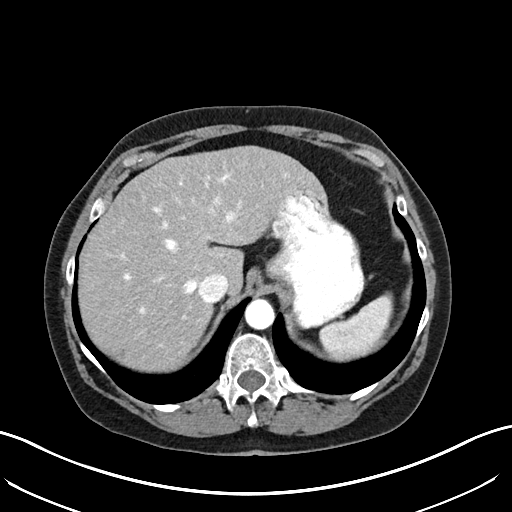
[im 84/89  soft-tissue]
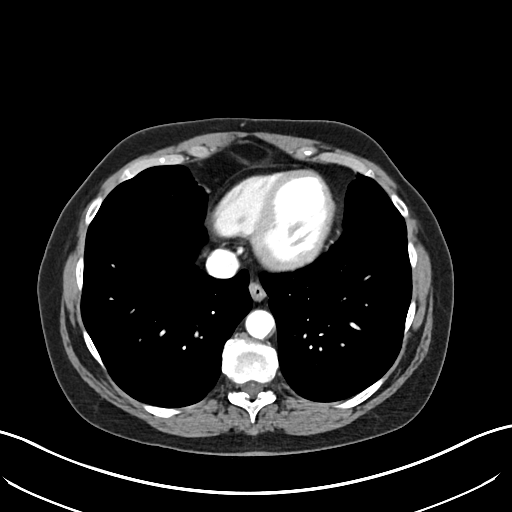

[Series 4: coronals abd pelvis 2.00 cor · coronal · 0.75mm/px · 3 of 142 slices shown]
[im 48/142  soft-tissue]
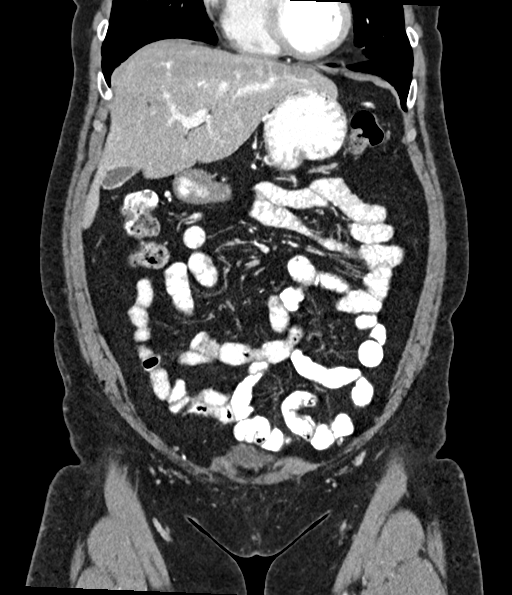
[im 63/142  soft-tissue]
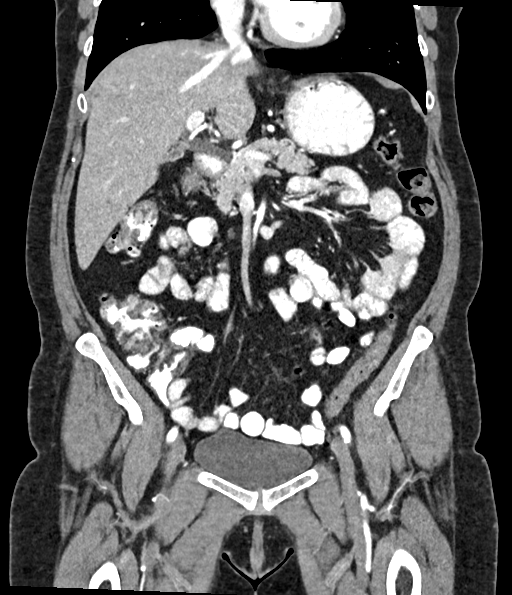
[im 79/142  soft-tissue]
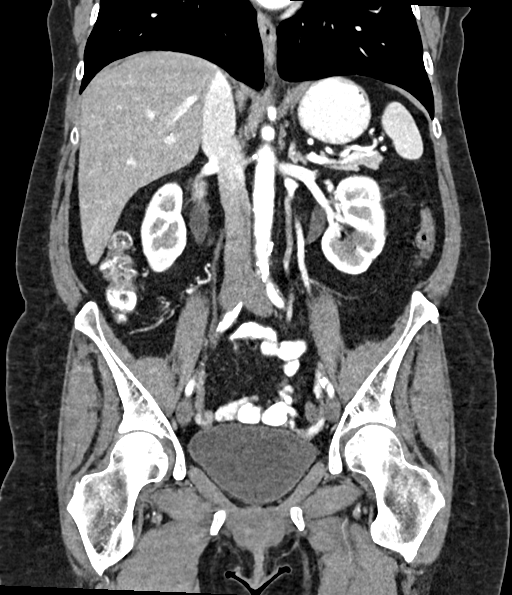

[16 of 46 positions shown; findings below may reference images not displayed]

FINDINGS: Lower chest: Anterior left lower lobe 4 mm solid pulmonary nodule
(series 3/image 9) is stable since [DATE] CT, considered benign.
No acute abnormality at the lung bases.

Hepatobiliary: Normal liver size. Simple 1.1 cm inferior right liver
cyst. Several subcentimeter hypodense liver lesions scattered
throughout the liver are too small to characterize and are not
appreciably changed, considered benign. No new liver lesions. Normal
gallbladder with no radiopaque cholelithiasis. No biliary ductal
dilatation.

Pancreas: Normal, with no mass or duct dilation.

Spleen: Normal size. No mass.

Adrenals/Urinary Tract: Normal adrenals. Several hypodense exophytic
renal cortical lesions in both kidneys, largest 2.1 cm in the
lateral lower right kidney (series 2/image 38), increased from
cm on [DATE] CT, and 2.7 cm in the lateral lower left kidney
(series 2/image 33), increased from 2.0 cm. No hydronephrosis.
Normal bladder.

Stomach/Bowel: Normal non-distended stomach. Normal caliber small
bowel with no small bowel wall thickening. Oral contrast transits to
colon. Normal diminutive appendix. Normal large bowel with no
diverticulosis, large bowel wall thickening or pericolonic fat
stranding.

Vascular/Lymphatic: Atherosclerotic nonaneurysmal abdominal aorta.
Patent portal, splenic, hepatic and renal veins. No pathologically
enlarged lymph nodes in the abdomen or pelvis.

Reproductive: Grossly normal uterus.  No adnexal mass.

Other: No pneumoperitoneum, ascites or focal fluid collection.

Musculoskeletal: No aggressive appearing focal osseous lesions. Mild
thoracolumbar spondylosis.
IMPRESSION: 1. No acute abnormality. No evidence of bowel obstruction or acute
bowel inflammation. Normal appendix.
2. Several indeterminate hypodense exophytic renal cortical lesions
in both kidneys, increased in size since [DATE] CT. MRI abdomen
without and with IV contrast recommended for further
characterization, to exclude renal neoplasm.
3. Aortic Atherosclerosis ([WJ]-[WJ]).

## 2019-11-16 MED ORDER — IOHEXOL 300 MG/ML  SOLN
100.0000 mL | Freq: Once | INTRAMUSCULAR | Status: AC | PRN
  Administered 2019-11-16: 100 mL via INTRAVENOUS

## 2019-11-20 ENCOUNTER — Encounter: Payer: Self-pay | Admitting: General Surgery

## 2019-11-21 ENCOUNTER — Other Ambulatory Visit: Payer: Self-pay

## 2019-11-21 ENCOUNTER — Ambulatory Visit: Payer: BC Managed Care – PPO | Admitting: Anesthesiology

## 2019-11-21 ENCOUNTER — Ambulatory Visit
Admission: RE | Admit: 2019-11-21 | Discharge: 2019-11-21 | Disposition: A | Discharge status: Home / Self Care | Payer: BC Managed Care – PPO | Attending: General Surgery | Admitting: General Surgery

## 2019-11-21 ENCOUNTER — Encounter
Admission: RE | Disposition: A | Discharge status: Home / Self Care | Payer: Self-pay | Source: Home / Self Care | Attending: General Surgery

## 2019-11-21 DIAGNOSIS — Z86010 Personal history of colonic polyps: Secondary | ICD-10-CM | POA: Insufficient documentation

## 2019-11-21 DIAGNOSIS — F4323 Adjustment disorder with mixed anxiety and depressed mood: Secondary | ICD-10-CM | POA: Diagnosis not present

## 2019-11-21 DIAGNOSIS — D124 Benign neoplasm of descending colon: Secondary | ICD-10-CM | POA: Diagnosis not present

## 2019-11-21 DIAGNOSIS — F329 Major depressive disorder, single episode, unspecified: Secondary | ICD-10-CM | POA: Diagnosis not present

## 2019-11-21 DIAGNOSIS — Z1211 Encounter for screening for malignant neoplasm of colon: Secondary | ICD-10-CM | POA: Diagnosis not present

## 2019-11-21 DIAGNOSIS — J449 Chronic obstructive pulmonary disease, unspecified: Secondary | ICD-10-CM | POA: Diagnosis not present

## 2019-11-21 DIAGNOSIS — F1021 Alcohol dependence, in remission: Secondary | ICD-10-CM | POA: Diagnosis not present

## 2019-11-21 DIAGNOSIS — F17200 Nicotine dependence, unspecified, uncomplicated: Secondary | ICD-10-CM | POA: Diagnosis not present

## 2019-11-21 DIAGNOSIS — M109 Gout, unspecified: Secondary | ICD-10-CM | POA: Diagnosis not present

## 2019-11-21 DIAGNOSIS — I10 Essential (primary) hypertension: Secondary | ICD-10-CM | POA: Insufficient documentation

## 2019-11-21 DIAGNOSIS — D123 Benign neoplasm of transverse colon: Secondary | ICD-10-CM | POA: Diagnosis not present

## 2019-11-21 DIAGNOSIS — F419 Anxiety disorder, unspecified: Secondary | ICD-10-CM | POA: Diagnosis not present

## 2019-11-21 HISTORY — PX: COLONOSCOPY WITH PROPOFOL: SHX5780

## 2019-11-21 SURGERY — COLONOSCOPY WITH PROPOFOL
Anesthesia: General

## 2019-11-21 MED ORDER — PHENYLEPHRINE HCL (PRESSORS) 10 MG/ML IV SOLN
INTRAVENOUS | Status: DC | PRN
  Administered 2019-11-21: 100 ug via INTRAVENOUS

## 2019-11-21 MED ORDER — PROPOFOL 500 MG/50ML IV EMUL
INTRAVENOUS | Status: AC
  Filled 2019-11-21: qty 50

## 2019-11-21 MED ORDER — PROPOFOL 10 MG/ML IV BOLUS
INTRAVENOUS | Status: DC | PRN
  Administered 2019-11-21: 70 mg via INTRAVENOUS

## 2019-11-21 MED ORDER — PROPOFOL 500 MG/50ML IV EMUL
INTRAVENOUS | Status: DC | PRN
  Administered 2019-11-21: 150 ug/kg/min via INTRAVENOUS

## 2019-11-21 MED ORDER — LIDOCAINE HCL (CARDIAC) PF 100 MG/5ML IV SOSY
PREFILLED_SYRINGE | INTRAVENOUS | Status: DC | PRN
  Administered 2019-11-21: 50 mg via INTRAVENOUS

## 2019-11-21 MED ORDER — SODIUM CHLORIDE 0.9 % IV SOLN
INTRAVENOUS | Status: DC
  Administered 2019-11-21: 1000 mL via INTRAVENOUS

## 2019-11-21 MED ORDER — LIDOCAINE HCL (PF) 2 % IJ SOLN
INTRAMUSCULAR | Status: AC
  Filled 2019-11-21: qty 5

## 2019-11-21 NOTE — Anesthesia Procedure Notes (Signed)
Date/Time: 11/21/2019 9:20 AM Performed by: Johnna Acosta, CRNA Pre-anesthesia Checklist: Patient identified, Emergency Drugs available, Suction available, Patient being monitored and Timeout performed Patient Re-evaluated:Patient Re-evaluated prior to induction Oxygen Delivery Method: Nasal cannula Preoxygenation: Pre-oxygenation with 100% oxygen Induction Type: IV induction

## 2019-11-21 NOTE — Op Note (Signed)
[facility] Patient Name: Regina Carroll Procedure Date: 11/21/2019 9:14 AM MRN: [id number] Account #: [id number] Date of Birth: [birth year] Admit Type: Outpatient Age: 59 Room: [facility] ENDO ROOM 1 Gender: Female Note Status: Finalized Procedure:             Colonoscopy Indications:           High risk colon cancer surveillance: Personal history                         of colonic polyps Providers:             Robert Bellow, MD Referring MD:          Juluis Rainier (Referring MD) Medicines:             Monitored Anesthesia Care Complications:         No immediate complications. Procedure:             Pre-Anesthesia Assessment:                        - Prior to the procedure, a History and Physical was                         performed, and patient medications, allergies and                         sensitivities were reviewed. The patient's tolerance                         of previous anesthesia was reviewed.                        - The risks and benefits of the procedure and the                         sedation options and risks were discussed with the                         patient. All questions were answered and informed                         consent was obtained.                        After obtaining informed consent, the colonoscope was                         passed under direct vision. Throughout the procedure,                         the patient's blood pressure, pulse, and oxygen                         saturations were monitored continuously. The                         Colonoscope was introduced through the anus and                         advanced to the the cecum, identified  by appendiceal                         orifice and ileocecal valve. The colonoscopy was                         performed without difficulty. The patient tolerated                         the procedure well. The quality of the bowel                          preparation was excellent. Findings:      A 3 mm polyp was found in the descending colon. The polyp was sessile.       Biopsies were taken with a cold forceps for histology.      A 10 mm polyp was found in the mid descending colon. The polyp was       sessile. Biopsies were taken with a cold forceps for histology.      A 8 mm polyp was found in the hepatic flexure. The polyp was sessile.       Biopsies were taken with a cold forceps for histology.      The retroflexed view of the distal rectum and anal verge was normal and       showed no anal or rectal abnormalities. Impression:            - One 3 mm polyp in the descending colon. Biopsied.                        - One 10 mm polyp in the mid descending colon.                         Biopsied.                        - One 8 mm polyp at the hepatic flexure. Biopsied.                        - The distal rectum and anal verge are normal on                         retroflexion view. Recommendation:        - Telephone endoscopist for pathology results in 1                         week. Procedure Code(s):     --- Professional ---                        [phone], Colonoscopy, flexible; with biopsy, single or                         multiple Diagnosis Code(s):     --- Professional ---                        Z86.010, Personal history of colonic polyps                        K63.5, Polyp of colon CPT  copyright 2019 [employer]. All rights reserved. The codes documented in this report are preliminary and upon coder review may  be revised to meet current compliance requirements. Robert Bellow, MD 11/21/2019 9:51:55 AM This report has been signed electronically. Number of Addenda: 0 Note Initiated On: 11/21/2019 9:14 AM Scope Withdrawal Time: 0 hours 15 minutes 11 seconds  Total Procedure Duration: 0 hours 25 minutes 56 seconds  Estimated Blood Loss:  Estimated blood loss: none.      [facility]

## 2019-11-21 NOTE — H&P (Signed)
Regina Carroll [id number] [birth year]     HPI:  Intermittent abdominal pain and past history of colonic polyps. For colonoscopy. Tolerated prep well.   Medications Prior to Admission  Medication Sig Dispense Refill Last Dose  . albuterol (PROVENTIL HFA;VENTOLIN HFA) 108 (90 Base) MCG/ACT inhaler Inhale 2 puffs into the lungs every 6 (six) hours as needed for wheezing or shortness of breath. 1 Inhaler 0 Past Week at Unknown time  . albuterol (VENTOLIN HFA) 108 (90 Base) MCG/ACT inhaler Inhale 2 puffs into the lungs every 6 (six) hours as needed. 6.7 g 0 Past Week at Unknown time  . allopurinol (ZYLOPRIM) 100 MG tablet Take 100 mg by mouth daily.  1 Past Week at Unknown time  . atenolol (TENORMIN) 25 MG tablet Take 25 mg by mouth daily.    11/20/2019 at Unknown time  . atorvastatin (LIPITOR) 20 MG tablet Take 20 mg by mouth daily.   Past Week at Unknown time  . buPROPion (WELLBUTRIN XL) 150 MG 24 hr tablet Take 150 mg by mouth daily.   Past Week at Unknown time  . citalopram (CELEXA) 20 MG tablet Take 20 mg by mouth daily.   11/20/2019 at Unknown time  . colchicine 0.6 MG tablet Take 0.6 mg by mouth daily as needed.   Past Week at Unknown time  . hydrochlorothiazide (HYDRODIURIL) 25 MG tablet Take 25 mg by mouth daily.   Past Week at Unknown time  . ibuprofen (ADVIL,MOTRIN) 800 MG tablet Take 1 tablet (800 mg total) by mouth every 8 (eight) hours as needed for moderate pain. 15 tablet 0 11/20/2019 at Unknown time  . omeprazole (PRILOSEC OTC) 20 MG tablet Take 20 mg by mouth daily.    Past Week at Unknown time  . Potassium Gluconate 595 MG TBCR Take 595 mg by mouth daily.    Past Week at Unknown time  . SUMAtriptan (IMITREX) 50 MG tablet Take 50 mg by mouth every 2 (two) hours as needed for migraine. May repeat in 2 hours if headache persists or recurs.   Past Week at Unknown time  . tiotropium (SPIRIVA) 18 MCG inhalation capsule Place 18 mcg into inhaler and inhale daily.   Past Week at Unknown time  .  traZODone (DESYREL) 150 MG tablet Take 150 mg by mouth at bedtime as needed.  1 Past Week at Unknown time  . vitamin B-12 (CYANOCOBALAMIN) 100 MCG tablet Take by mouth daily.   Past Week at Unknown time  . pantoprazole (PROTONIX) 40 MG tablet Take 1 tablet (40 mg total) by mouth daily. 30 tablet 1   . sucralfate (CARAFATE) 1 g tablet Take 1 tablet (1 g total) by mouth 4 (four) times daily. 120 tablet 1    Allergies  Allergen Reactions  . Codeine   . Penicillins Rash    Has patient had a PCN reaction causing immediate rash, facial/tongue/throat swelling, SOB or lightheadedness with hypotension: Unknown Has patient had a PCN reaction causing severe rash involving mucus membranes or skin necrosis: Unknown Has patient had a PCN reaction that required hospitalization: Unknown Has patient had a PCN reaction occurring within the last 10 years: Unknown If all of the above answers are "NO", then may proceed with Cephalosporin use.    Past Medical History:  Diagnosis Date  . Abnormal cervical cytology   . Adjustment reaction with anxiety and depression   . Anxiety   . COPD (chronic obstructive pulmonary disease) ([place])   . Depression   . Diarrhea   .  Gonorrhea   . Gout   . History of acute PID   . Hx of trichomoniasis   . Hypertension   . Plantar fasciitis    Past Surgical History:  Procedure Laterality Date  . BREAST EXCISIONAL BIOPSY Left 02/17/2009   neg  . breast mass removed    . CESAREAN SECTION Bilateral unk  . COLONOSCOPY WITH PROPOFOL N/A 09/28/2016   Procedure: COLONOSCOPY WITH PROPOFOL;  Surgeon: Lollie Sails, MD;  Location[facility] ENDOSCOPY;  Service: Endoscopy;  Laterality: N/A;  . ESOPHAGOGASTRODUODENOSCOPY (EGD) WITH PROPOFOL N/A 09/28/2016   Procedure: ESOPHAGOGASTRODUODENOSCOPY (EGD) WITH PROPOFOL;  Surgeon: Lollie Sails, MD;  Location: [facility] ENDOSCOPY;  Service: Endoscopy;  Laterality: N/A;  . TUBAL LIGATION     Social History   Socioeconomic History  .  Marital status: Legally Separated    Spouse name: Not on file  . Number of children: Not on file  . Years of education: Not on file  . Highest education level: Not on file  Occupational History  . Not on file  Tobacco Use  . Smoking status: Current Every Day Smoker    Packs/day: 1.50    Years: 25.00    Pack years: 37.50  . Smokeless tobacco: Never Used  Substance and Sexual Activity  . Alcohol use: No    Comment: recovering alcoholic.   . Drug use: No  . Sexual activity: Never    Birth control/protection: None  Other Topics Concern  . Not on file  Social History Narrative  . Not on file   Social Determinants of Health   Financial Resource Strain:   . Difficulty of Paying Living Expenses:   Food Insecurity:   . Worried About Charity fundraiser in the Last Year:   . Arboriculturist in the Last Year:   Transportation Needs:   . Film/video editor (Medical):   Marland Kitchen Lack of Transportation (Non-Medical):   Physical Activity:   . Days of Exercise per Week:   . Minutes of Exercise per Session:   Stress:   . Feeling of Stress :   Social Connections:   . Frequency of Communication with Friends and Family:   . Frequency of Social Gatherings with Friends and Family:   . Attends Religious Services:   . Active Member of Clubs or Organizations:   . Attends Archivist Meetings:   Marland Kitchen Marital Status:   Intimate Partner Violence:   . Fear of Current or Ex-Partner:   . Emotionally Abused:   Marland Kitchen Physically Abused:   . Sexually Abused:    Social History   Social History Narrative  . Not on file     ROS: Negative.     PE: HEENT: Negative. Lungs: Clear. Cardio: RR.  Assessment/Plan:  Proceed with planned endoscopy.   Forest Gleason [facility] 11/21/2019

## 2019-11-21 NOTE — Transfer of Care (Signed)
Immediate Anesthesia Transfer of Care Note  Patient: Regina Carroll  Procedure(s) Performed: COLONOSCOPY WITH PROPOFOL (N/A )  Patient Location: PACU  Anesthesia Type:General  Level of Consciousness: sedated  Airway & Oxygen Therapy: Patient Spontanous Breathing  Post-op Assessment: Report given to RN and Post -op Vital signs reviewed and stable  Post vital signs: Reviewed and stable  Last Vitals:  Vitals Value Taken Time  BP 99/49 11/21/19 0954  Temp    Pulse 55 11/21/19 0954  Resp 22 11/21/19 0954  SpO2 98 % 11/21/19 0954  Vitals shown include unvalidated device data.  Last Pain:  Vitals:   11/21/19 0838  TempSrc: Temporal  PainSc: 0-No pain         Complications: No apparent anesthesia complications

## 2019-11-21 NOTE — Anesthesia Postprocedure Evaluation (Signed)
Anesthesia Post Note  Patient: Regina Carroll  Procedure(s) Performed: COLONOSCOPY WITH PROPOFOL (N/A )  Patient location during evaluation: Endoscopy Anesthesia Type: General Level of consciousness: awake and alert Pain management: pain level controlled Vital Signs Assessment: post-procedure vital signs reviewed and stable Respiratory status: spontaneous breathing, nonlabored ventilation, respiratory function stable and patient connected to nasal cannula oxygen Cardiovascular status: blood pressure returned to baseline and stable Postop Assessment: no apparent nausea or vomiting Anesthetic complications: no     Last Vitals:  Vitals:   11/21/19 1014 11/21/19 1024  BP: (!) 113/53 119/69  Pulse:    Resp:    Temp:    SpO2:      Last Pain:  Vitals:   11/21/19 1024  TempSrc:   PainSc: 0-No pain                 Arita Miss

## 2019-11-21 NOTE — Anesthesia Preprocedure Evaluation (Signed)
Anesthesia Evaluation  Patient identified by MRN, date of birth, ID band Patient awake    Reviewed: Allergy & Precautions, H&P , NPO status , Patient's Chart, lab work & pertinent test results  History of Anesthesia Complications Negative for: history of anesthetic complications  Airway Mallampati: II  TM Distance: >3 FB Neck ROM: full    Dental  (+) Partial Lower   Pulmonary neg shortness of breath, COPD, Current SmokerPatient did not abstain from smoking.,    Pulmonary exam normal breath sounds clear to auscultation       Cardiovascular Exercise Tolerance: Good hypertension, (-) angina(-) Past MI and (-) DOE Normal cardiovascular exam Rhythm:regular Rate:Normal     Neuro/Psych PSYCHIATRIC DISORDERS Anxiety Depression negative neurological ROS     GI/Hepatic negative GI ROS, Neg liver ROS, neg GERD  ,  Endo/Other  negative endocrine ROS  Renal/GU negative Renal ROS  negative genitourinary   Musculoskeletal   Abdominal   Peds  Hematology negative hematology ROS (+)   Anesthesia Other Findings Past Medical History: No date: Abnormal cervical cytology No date: Adjustment reaction with anxiety and depression No date: Anxiety No date: COPD (chronic obstructive pulmonary disease) (* No date: Depression No date: Diarrhea No date: Gonorrhea No date: Gout No date: History of acute PID No date: Hx of trichomoniasis No date: Hypertension  Past Surgical History: 02/17/2009: BREAST EXCISIONAL BIOPSY Left     Comment: neg No date: breast mass removed unk: CESAREAN SECTION Bilateral No date: TUBAL LIGATION  BMI    Body Mass Index:  24.56 kg/m      Reproductive/Obstetrics negative OB ROS                             Anesthesia Physical  Anesthesia Plan  ASA: II  Anesthesia Plan: General   Post-op Pain Management:    Induction: Intravenous  PONV Risk Score and Plan: 2 and  Ondansetron, Propofol infusion and TIVA  Airway Management Planned: Nasal Cannula  Additional Equipment: None  Intra-op Plan:   Post-operative Plan:   Informed Consent: I have reviewed the patients History and Physical, chart, labs and discussed the procedure including the risks, benefits and alternatives for the proposed anesthesia with the patient or authorized representative who has indicated his/her understanding and acceptance.     Dental Advisory Given  Plan Discussed with: Anesthesiologist, CRNA and Surgeon  Anesthesia Plan Comments: (Discussed risks of anesthesia with patient, including possibility of difficulty with spontaneous ventilation under anesthesia necessitating airway intervention, PONV, and rare risks such as cardiac or respiratory or neurological events. Patient understands.)        Anesthesia Quick Evaluation

## 2019-11-22 ENCOUNTER — Other Ambulatory Visit: Payer: Self-pay | Admitting: Gastroenterology

## 2019-11-22 ENCOUNTER — Encounter: Payer: Self-pay | Admitting: *Deleted

## 2019-11-22 DIAGNOSIS — R935 Abnormal findings on diagnostic imaging of other abdominal regions, including retroperitoneum: Secondary | ICD-10-CM

## 2019-11-22 DIAGNOSIS — N289 Disorder of kidney and ureter, unspecified: Secondary | ICD-10-CM

## 2019-11-22 LAB — SURGICAL PATHOLOGY

## 2019-12-06 ENCOUNTER — Other Ambulatory Visit: Payer: Self-pay

## 2019-12-06 ENCOUNTER — Ambulatory Visit
Admission: RE | Admit: 2019-12-06 | Discharge: 2019-12-06 | Disposition: A | Discharge status: Home / Self Care | Payer: BC Managed Care – PPO | Source: Ambulatory Visit | Attending: Gastroenterology | Admitting: Gastroenterology

## 2019-12-06 DIAGNOSIS — N289 Disorder of kidney and ureter, unspecified: Secondary | ICD-10-CM | POA: Diagnosis present

## 2019-12-06 DIAGNOSIS — R935 Abnormal findings on diagnostic imaging of other abdominal regions, including retroperitoneum: Secondary | ICD-10-CM | POA: Diagnosis not present

## 2019-12-06 IMAGING — MR MR ABDOMEN WO/W CM
17 series · 47 of 48 positions shown · IV contrast (6ml Gadavist)
Comparison: [DATE]

CLINICAL DATA: Indeterminate renal lesions on CT, for further
characterization. Abdominal pain.

EXAM:
MRI ABDOMEN WITHOUT AND WITH CONTRAST
TECHNIQUE: Multiplanar multisequence MR imaging of the abdomen was performed
both before and after the administration of intravenous contrast.
CONTRAST:  6mL GADAVIST GADOBUTROL 1 MMOL/ML IV SOLN

[Series 3: T2 · coronal · 6.0mm · 1.19mm/px · 1 of 30 slices shown (1 of 2)]
[im 1/30]
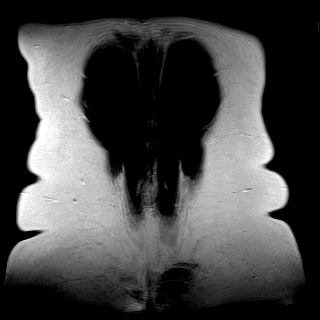

[Series 4: T2 · axial · 6.0mm · 1.19mm/px · 1 of 32 slices shown (2 of 2)]
[im 1/32]
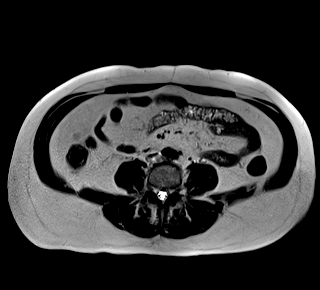

[Series 6: T2 fat-sat · axial · 6.0mm · 1.19mm/px · 1 of 34 slices shown]
[im 1/34]
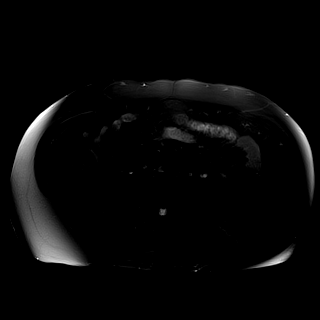

[Series 8: ax dwi_adc · axial · 6.0mm · 1.42mm/px · 1 of 34 slices shown]
[im 1/34]
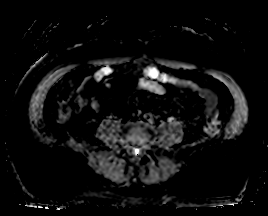

[Series 9: T1 · axial · 6.0mm · 0.74mm/px · 1 of 32 slices shown (1 of 2)]
[im 1/32]
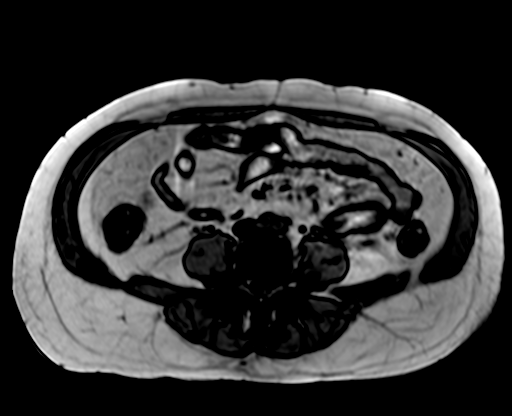

[Series 10: bSSFP · axial · 6.0mm · 0.74mm/px · z∈[-84,+140]mm · 2 of 32 slices shown]
[im 1/32]
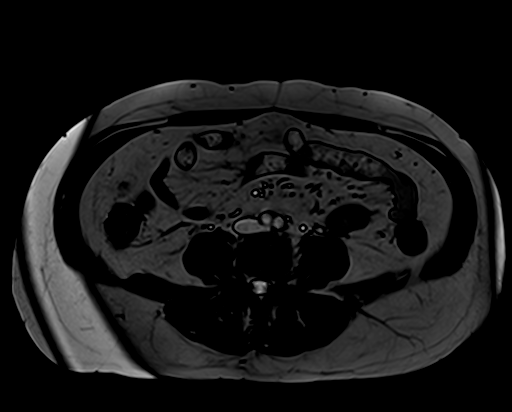
[im 32/32]
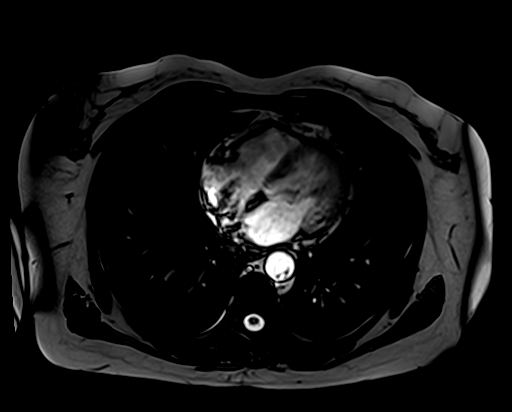

[Series 11: T1 dynamic fat-sat · axial · non-contrast · 3.0mm · 1.19mm/px · z∈[-91,+146]mm · 4 of 80 slices shown (1 of 5)]
[im 1/80]
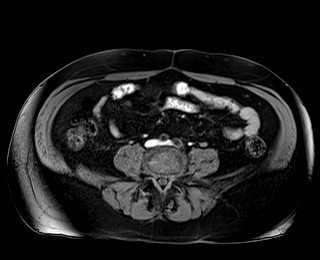
[im 27/80]
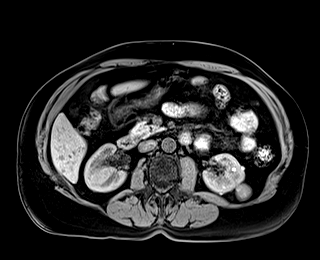
[im 53/80]
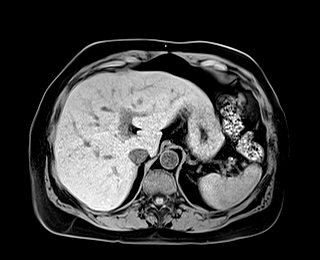
[im 80/80]
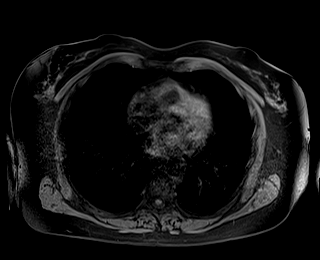

[Series 12: T1 dynamic fat-sat post-contrast · axial · 3.0mm · 1.19mm/px · z∈[-91,+146]mm · 4 of 80 slices shown (1 of 4)]
[im 1/80]
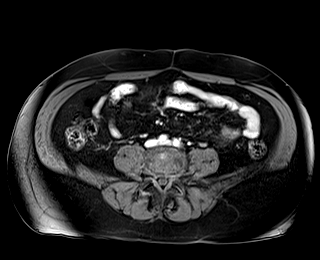
[im 27/80]
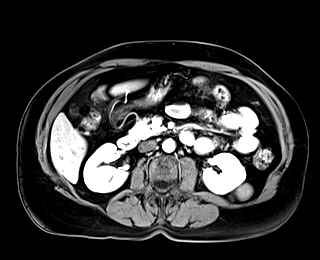
[im 53/80]
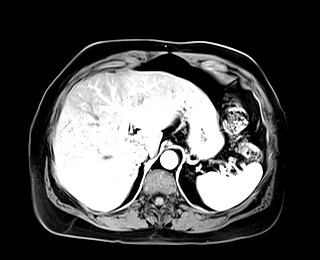
[im 80/80]
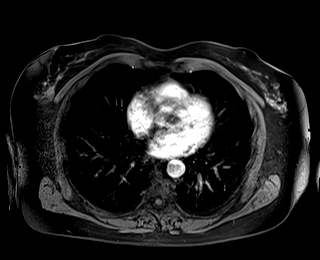

[Series 13: T1 dynamic fat-sat · axial · 3.0mm · 1.19mm/px · z∈[-91,+146]mm · 4 of 80 slices shown (2 of 5)]
[im 1/80]
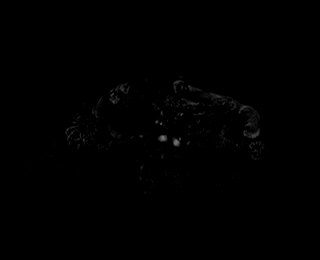
[im 27/80]
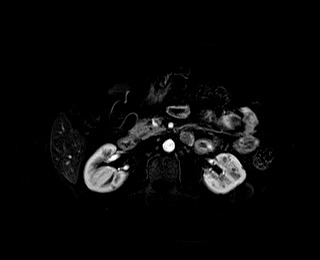
[im 53/80]
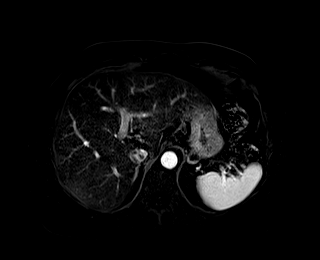
[im 80/80]
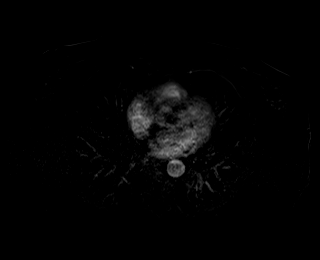

[Series 14: T1 dynamic fat-sat post-contrast · axial · 3.0mm · 1.19mm/px · z∈[-91,+146]mm · 4 of 80 slices shown (2 of 4)]
[im 1/80]
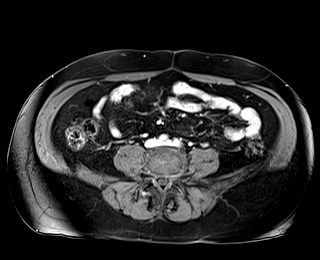
[im 27/80]
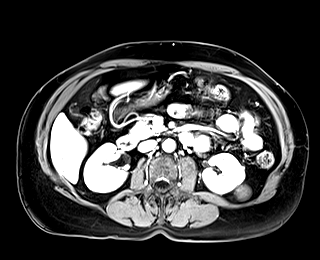
[im 53/80]
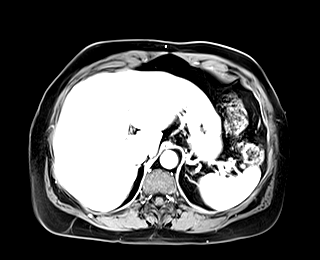
[im 80/80]
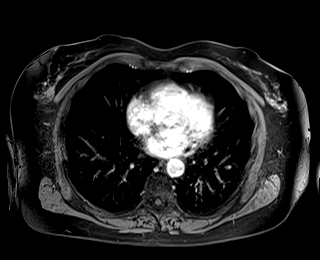

[Series 15: T1 dynamic fat-sat · axial · 3.0mm · 1.19mm/px · z∈[-91,+146]mm · 4 of 80 slices shown (3 of 5)]
[im 1/80]
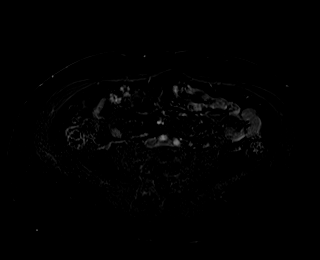
[im 27/80]
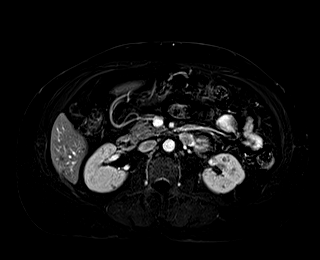
[im 53/80]
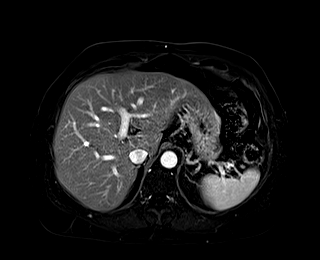
[im 80/80]
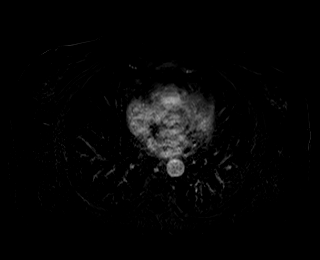

[Series 16: T1 dynamic fat-sat post-contrast · axial · 3.0mm · 1.19mm/px · z∈[-91,+146]mm · 4 of 80 slices shown (3 of 4)]
[im 1/80]
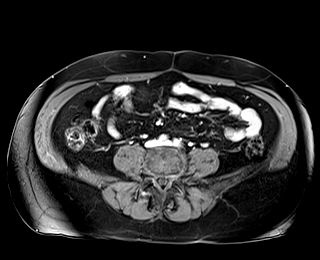
[im 27/80]
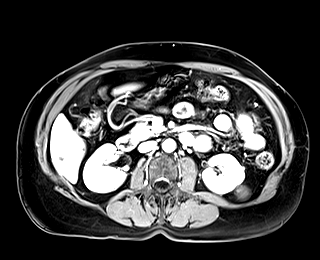
[im 53/80]
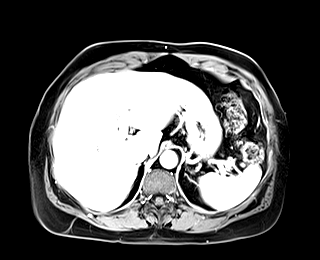
[im 80/80]
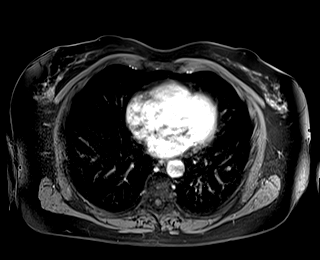

[Series 17: T1 dynamic fat-sat · axial · 3.0mm · 1.19mm/px · z∈[-91,+146]mm · 4 of 80 slices shown (4 of 5)]
[im 1/80]
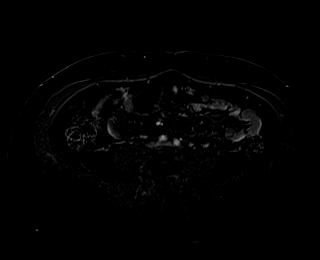
[im 27/80]
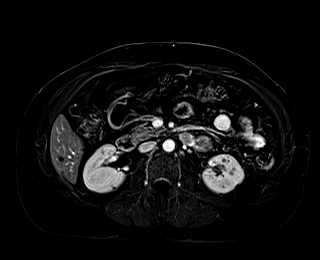
[im 53/80]
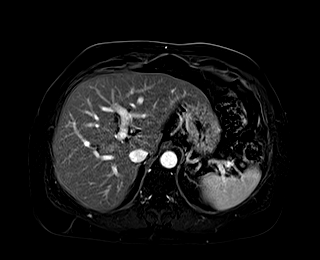
[im 80/80]
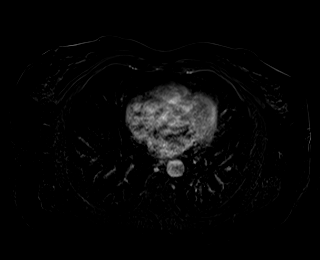

[Series 18: T1 dynamic post-contrast · coronal · 3.0mm · 1.31mm/px · 3 of 72 slices shown]
[im 1/72]
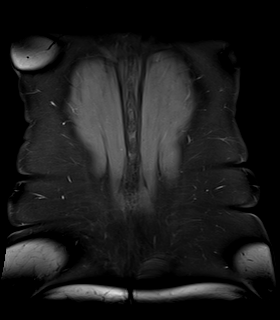
[im 36/72]
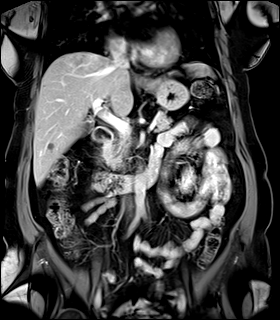
[im 72/72]
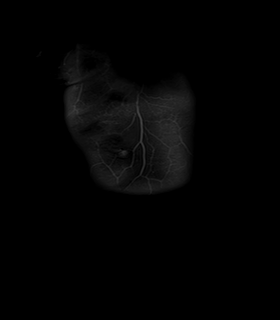

[Series 19: T1 dynamic fat-sat post-contrast · axial · 3.0mm · 1.19mm/px · z∈[-91,+146]mm · 4 of 80 slices shown (4 of 4)]
[im 1/80]
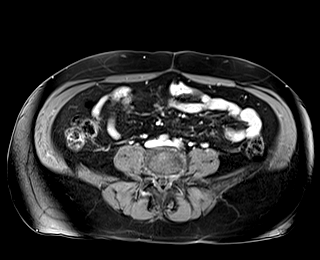
[im 27/80]
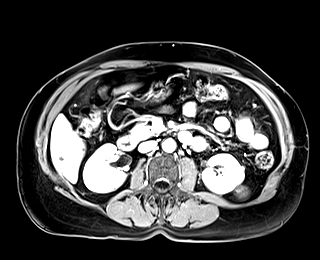
[im 53/80]
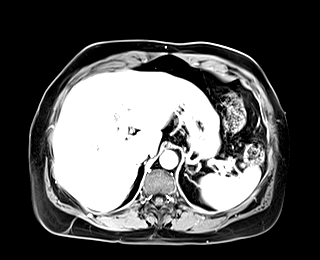
[im 80/80]
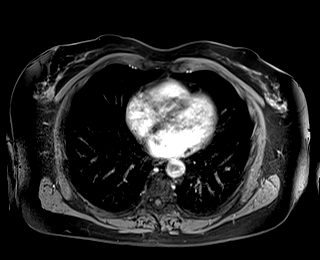

[Series 20: T1 dynamic fat-sat · axial · 3.0mm · 1.19mm/px · z∈[-91,+146]mm · 4 of 80 slices shown (5 of 5)]
[im 1/80]
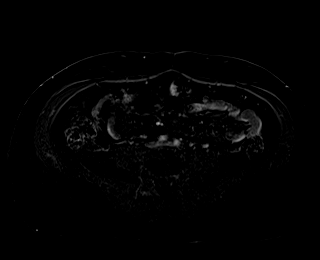
[im 27/80]
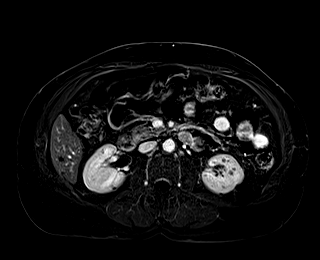
[im 53/80]
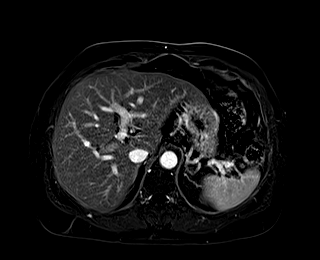
[im 80/80]
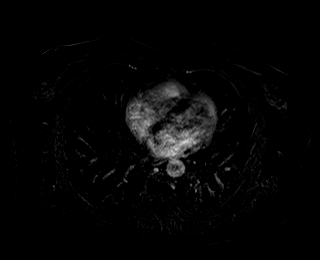

[Series 1019: T1 · axial · 6.0mm · 0.74mm/px · 1 of 32 slices shown (2 of 2)]
[im 1/32]
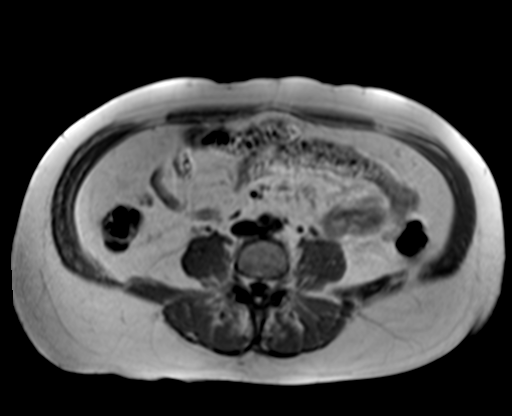

[47 of 48 positions shown; findings below may reference images not displayed]

FINDINGS: Lower chest: Unremarkable

Hepatobiliary: Mildly contracted gallbladder. No biliary dilatation.
Scattered small T2 hyperintense sharply defined lesions in the liver
do not enhance and favor cysts.

Pancreas:  Unremarkable

Spleen:  Unremarkable

Adrenals/Urinary Tract: Numerous scattered Bosniak category 1 and
category 2 renal cysts are identified. None of these demonstrate
appreciable abnormal enhancement on subtraction imaging. Some of the
scattered tiny lesions are technically too small to characterize.

Adrenal glands unremarkable.

Stomach/Bowel: Unremarkable

Vascular/Lymphatic: Aortoiliac atherosclerotic vascular disease. No
pathologic adenopathy.

Other:  No supplemental non-categorized findings.

Musculoskeletal: Unremarkable
IMPRESSION: 1. Numerous scattered Bosniak category 1 and category 2 renal cysts.
No worrisome renal lesions.
2. There are few small hepatic cysts.
3.  Aortic Atherosclerosis ([UY]-[UY]).

## 2019-12-06 MED ORDER — GADOBUTROL 1 MMOL/ML IV SOLN
6.0000 mL | Freq: Once | INTRAVENOUS | Status: AC | PRN
  Administered 2019-12-06: 6 mL via INTRAVENOUS

## 2020-07-30 ENCOUNTER — Other Ambulatory Visit: Payer: Self-pay | Admitting: Nurse Practitioner

## 2020-07-30 DIAGNOSIS — Z1231 Encounter for screening mammogram for malignant neoplasm of breast: Secondary | ICD-10-CM

## 2020-08-15 ENCOUNTER — Ambulatory Visit
Admission: RE | Admit: 2020-08-15 | Discharge: 2020-08-15 | Disposition: A | Discharge status: Home / Self Care | Payer: BC Managed Care – PPO | Source: Ambulatory Visit | Attending: Nurse Practitioner | Admitting: Nurse Practitioner

## 2020-08-15 ENCOUNTER — Other Ambulatory Visit: Payer: Self-pay

## 2020-08-15 DIAGNOSIS — Z1231 Encounter for screening mammogram for malignant neoplasm of breast: Secondary | ICD-10-CM | POA: Insufficient documentation

## 2020-08-15 IMAGING — MG MM DIGITAL SCREENING BILAT W/ TOMO AND CAD
8 series · 9 of 24 positions shown · non-contrast
Comparison: Previous exam(s).

CLINICAL DATA: Screening.

EXAM:
DIGITAL SCREENING BILATERAL MAMMOGRAM WITH TOMOSYNTHESIS AND CAD
TECHNIQUE: Bilateral screening digital craniocaudal and mediolateral oblique
mammograms were obtained. Bilateral screening digital breast
tomosynthesis was performed. The images were evaluated with
computer-aided detection.

[L MLO synth-2D]
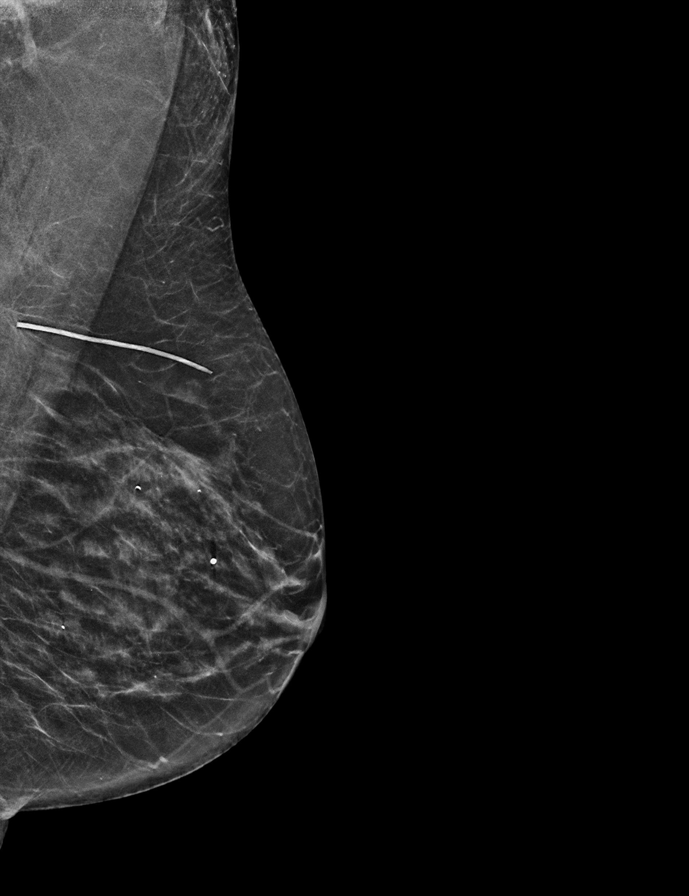

[R CC synth-2D]
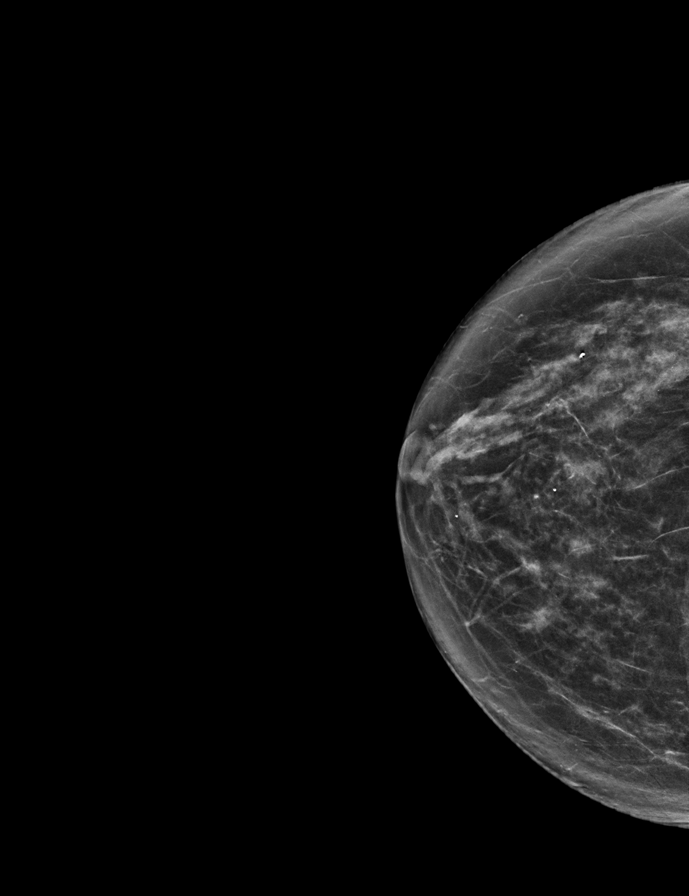

[L CC synth-2D]
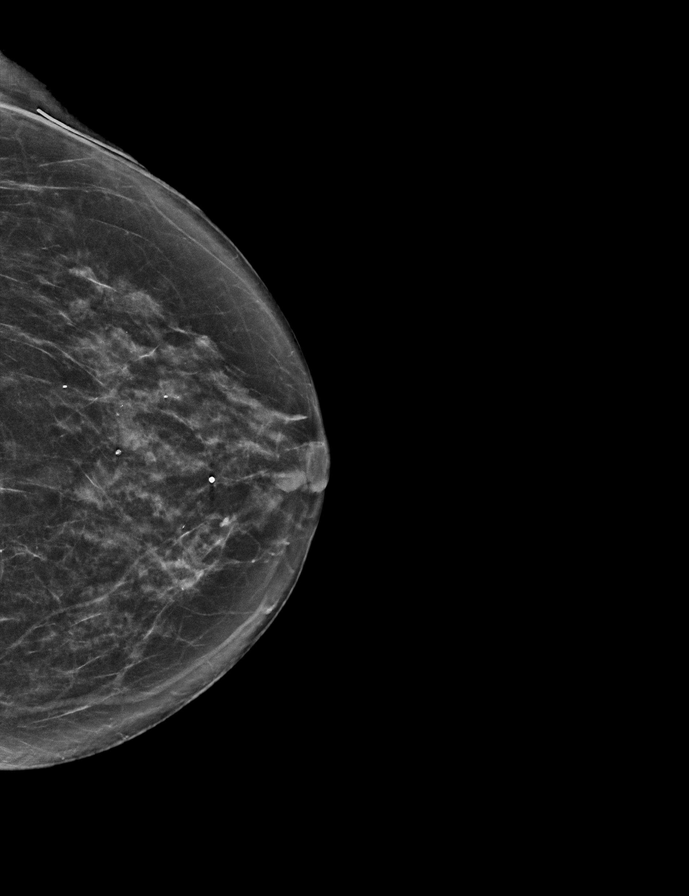

[R MLO synth-2D]
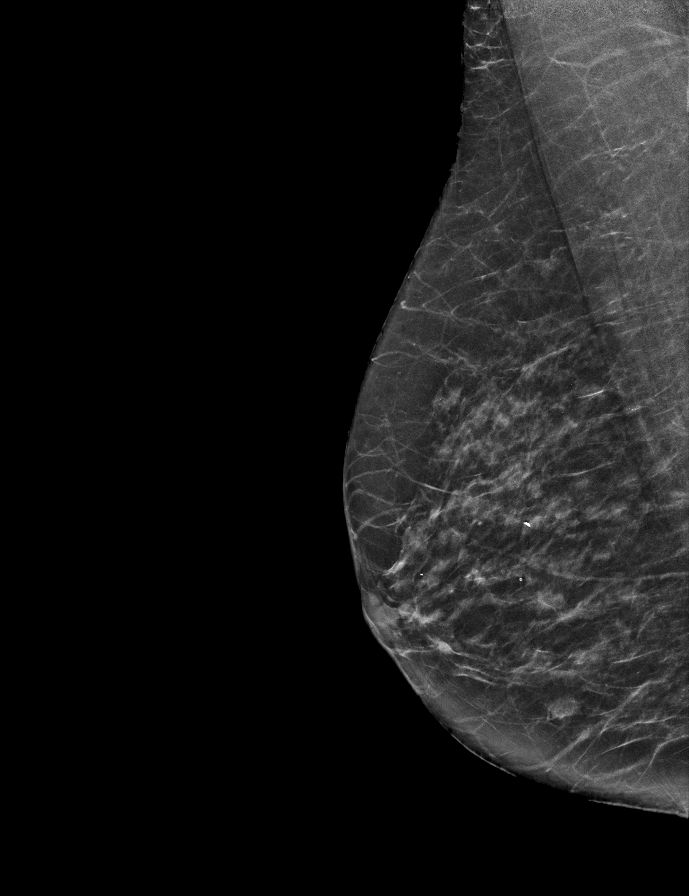

[L CC tomo · 2 of 58 frames shown]
[frame 19/58]
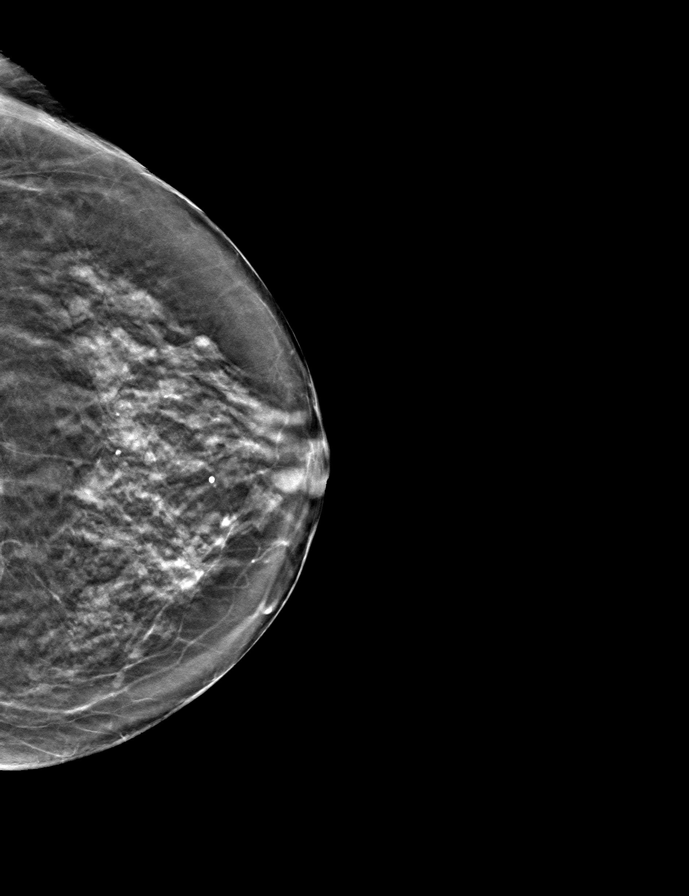
[frame 29/58]
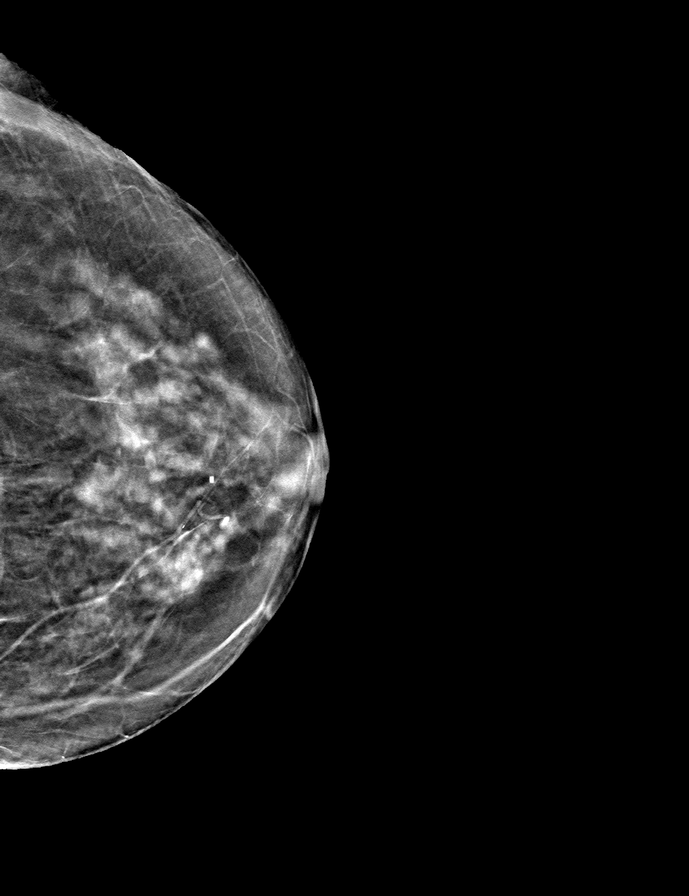

[R MLO tomo · tomo slice 29/56.0]
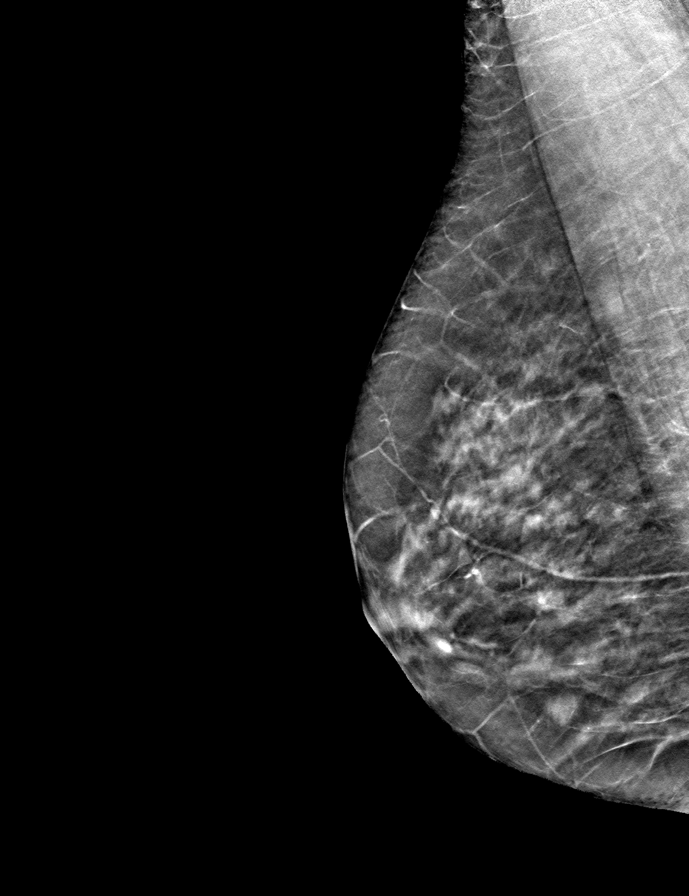

[L MLO tomo · tomo slice 23/44.0]
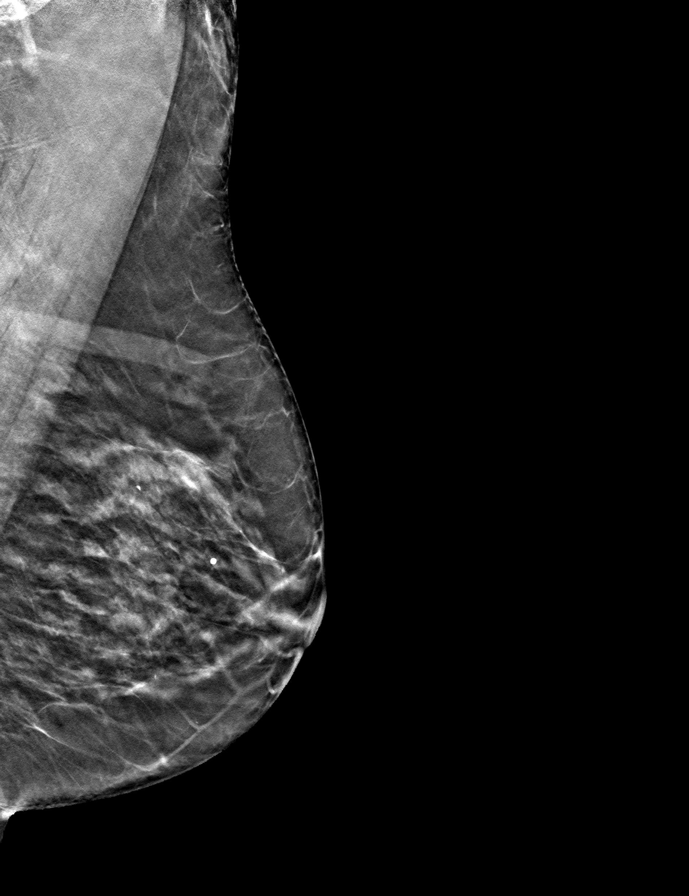

[R CC tomo · tomo slice 31/62.0]
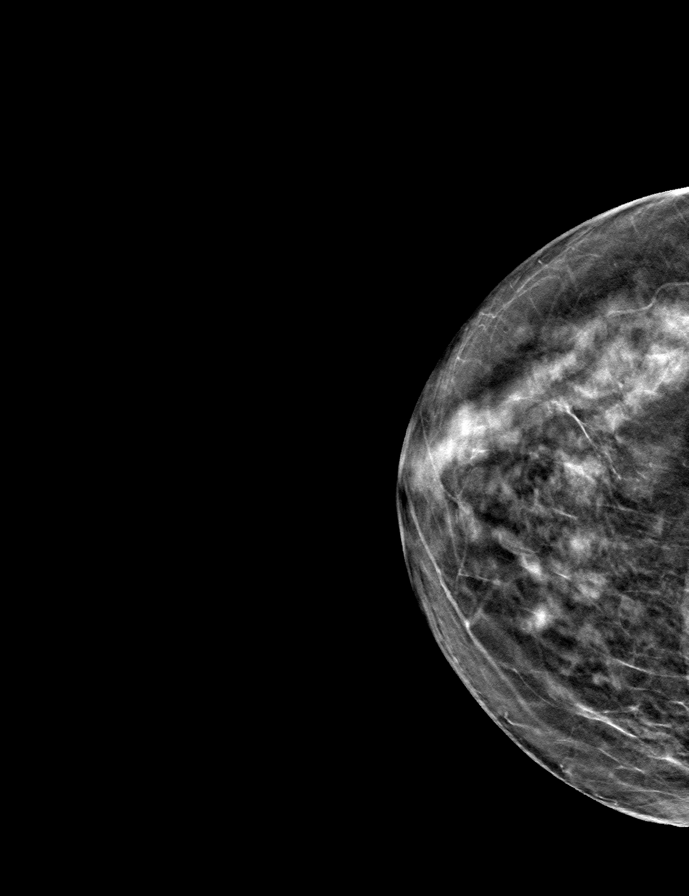

[9 of 24 positions shown; findings below may reference images not displayed]

ACR Breast Density Category c: The breast tissue is heterogeneously
dense, which may obscure small masses.
FINDINGS: There are no findings suspicious for malignancy. The images were
evaluated with computer-aided detection.
IMPRESSION: No mammographic evidence of malignancy. A result letter of this
screening mammogram will be mailed directly to the patient.

RECOMMENDATION:
Screening mammogram in one year. (Code:[6J])

BI-RADS CATEGORY  1: Negative.

## 2020-11-07 ENCOUNTER — Other Ambulatory Visit: Payer: Self-pay | Admitting: Pulmonary Disease

## 2020-11-07 DIAGNOSIS — Z0389 Encounter for observation for other suspected diseases and conditions ruled out: Secondary | ICD-10-CM

## 2020-11-18 ENCOUNTER — Ambulatory Visit: Payer: BC Managed Care – PPO

## 2020-11-19 DIAGNOSIS — R911 Solitary pulmonary nodule: Secondary | ICD-10-CM | POA: Insufficient documentation

## 2020-11-19 DIAGNOSIS — I700 Atherosclerosis of aorta: Secondary | ICD-10-CM | POA: Insufficient documentation

## 2020-11-26 ENCOUNTER — Ambulatory Visit: Payer: BC Managed Care – PPO

## 2020-11-27 ENCOUNTER — Other Ambulatory Visit (HOSPITAL_COMMUNITY): Payer: Self-pay | Admitting: Pulmonary Disease

## 2020-11-27 ENCOUNTER — Other Ambulatory Visit: Payer: Self-pay | Admitting: Pulmonary Disease

## 2020-11-27 DIAGNOSIS — J449 Chronic obstructive pulmonary disease, unspecified: Secondary | ICD-10-CM

## 2020-12-09 ENCOUNTER — Other Ambulatory Visit: Payer: Self-pay

## 2020-12-09 ENCOUNTER — Ambulatory Visit
Admission: RE | Admit: 2020-12-09 | Discharge: 2020-12-09 | Disposition: A | Discharge status: Home / Self Care | Payer: BC Managed Care – PPO | Source: Ambulatory Visit | Attending: Pulmonary Disease | Admitting: Pulmonary Disease

## 2020-12-09 DIAGNOSIS — J449 Chronic obstructive pulmonary disease, unspecified: Secondary | ICD-10-CM | POA: Insufficient documentation

## 2020-12-09 IMAGING — CT CT CHEST W/O CM
2 of 4 series · 15 of 36 positions shown, 18 images · non-contrast
Comparison: Most recent radiograph [DATE]

CLINICAL DATA: Chronic obstructive pulmonary disease. Current
smoker. COVID in [DATE] in [DATE]. Weight loss. Fatigue
and shortness of breath on exertion.

EXAM:
CT CHEST WITHOUT CONTRAST
TECHNIQUE: Multidetector CT imaging of the chest was performed following the
standard protocol without IV contrast.

[Series 2: chest 2.00 · axial · 0.57mm/px · z∈[-1208,-906]mm · 12 of 179 slices shown, 15 images]
[im 14/179  mediastinal]
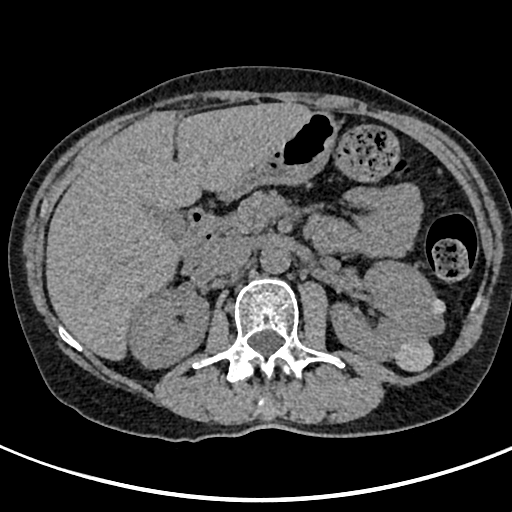
[im 14/179  lung]
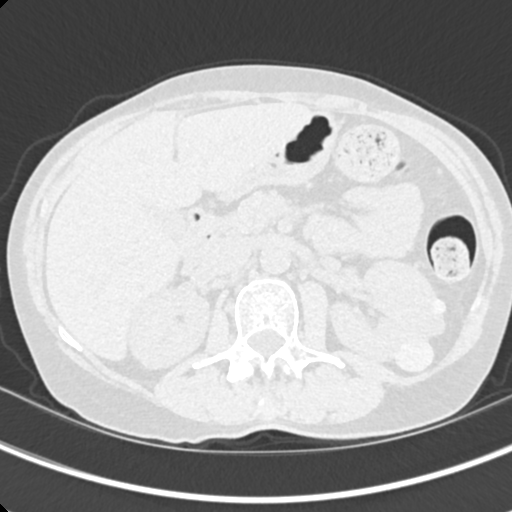
[im 28/179  lung]
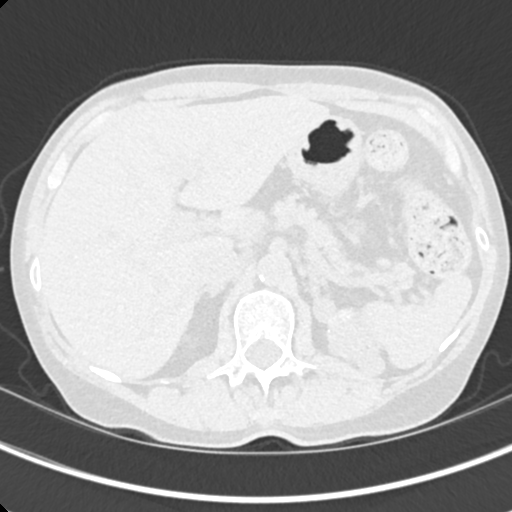
[im 42/179  lung]
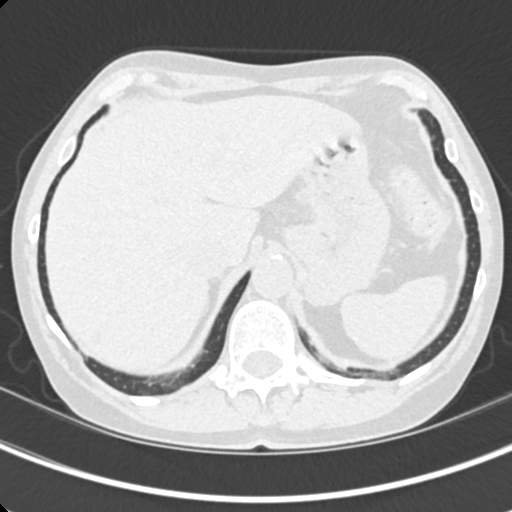
[im 55/179  lung]
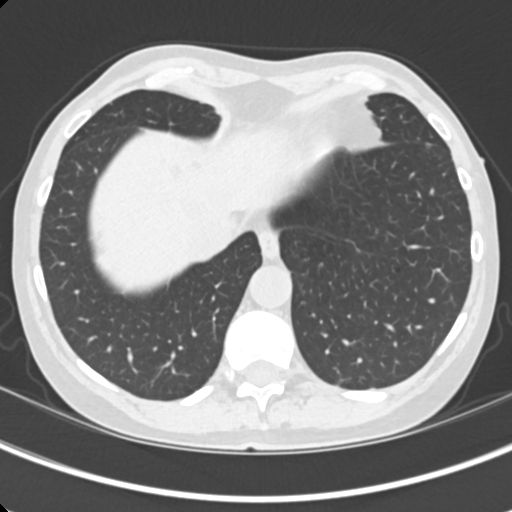
[im 69/179  mediastinal]
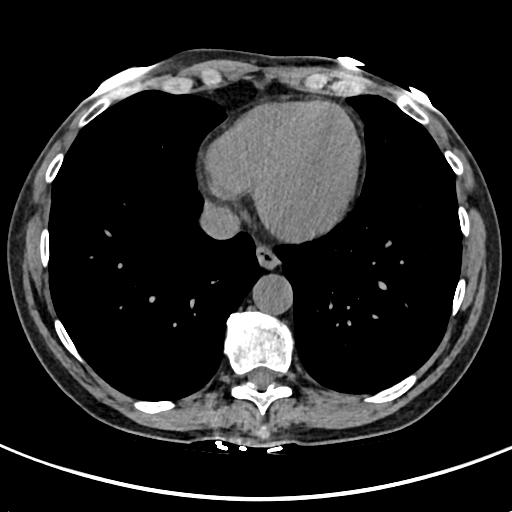
[im 69/179  lung]
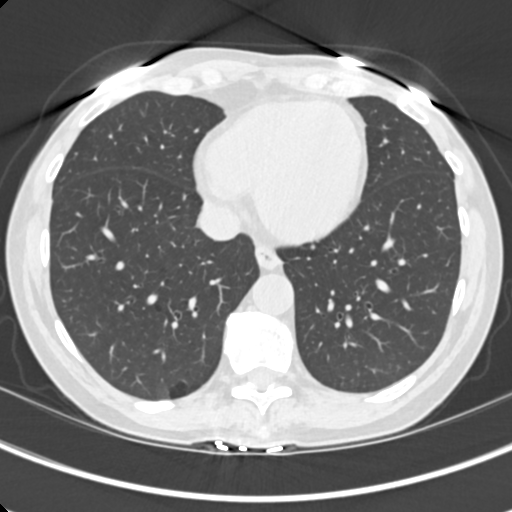
[im 83/179  lung]
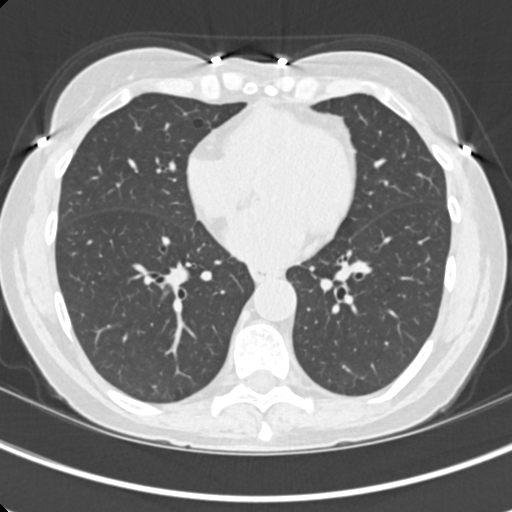
[im 96/179  lung]
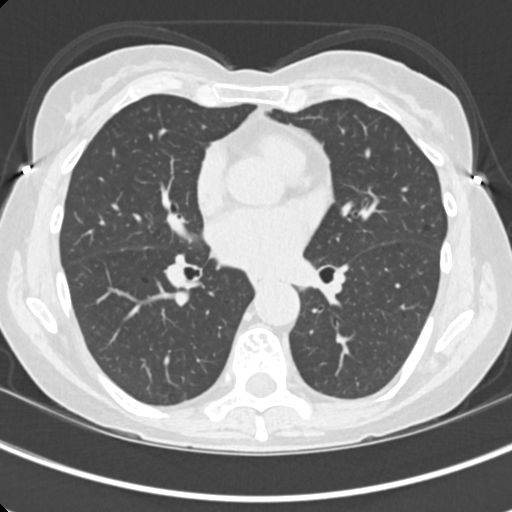
[im 110/179  lung]
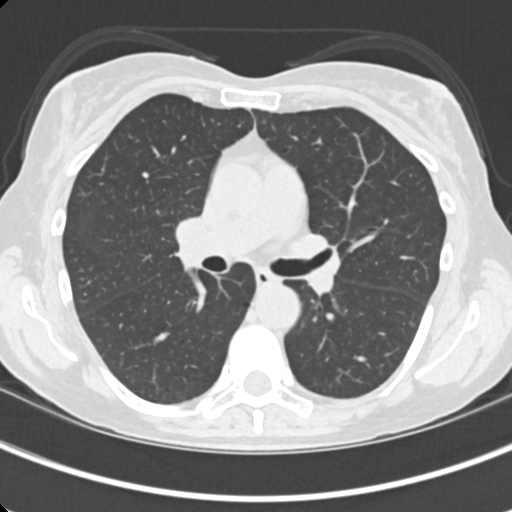
[im 124/179  mediastinal]
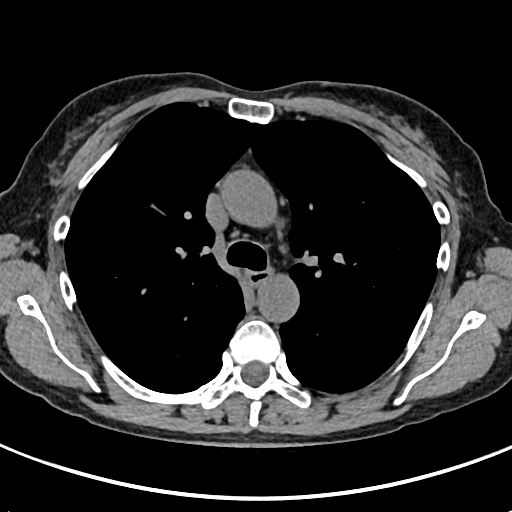
[im 124/179  lung]
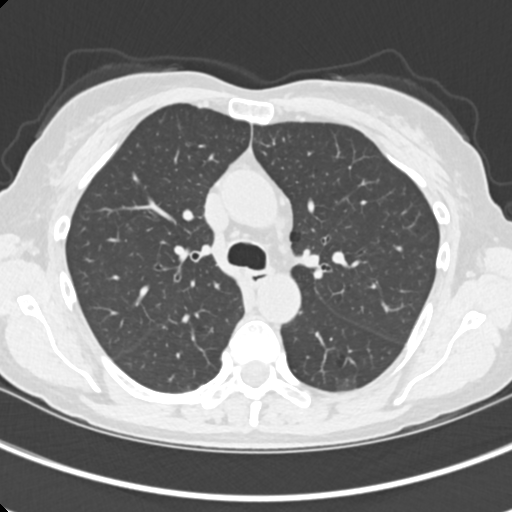
[im 137/179  lung]
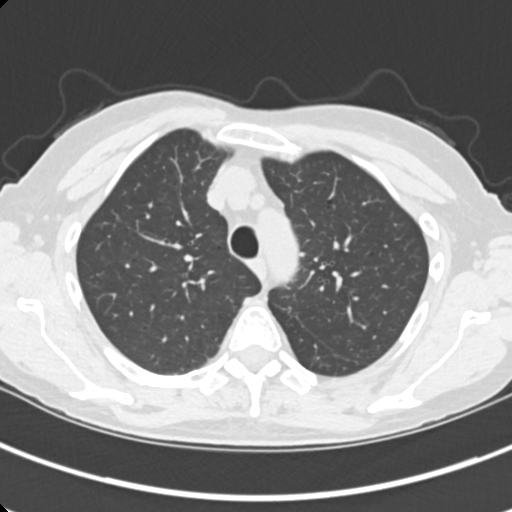
[im 151/179  lung]
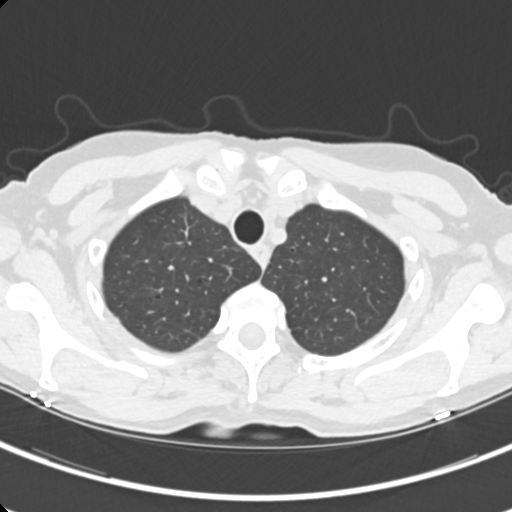
[im 165/179  lung]
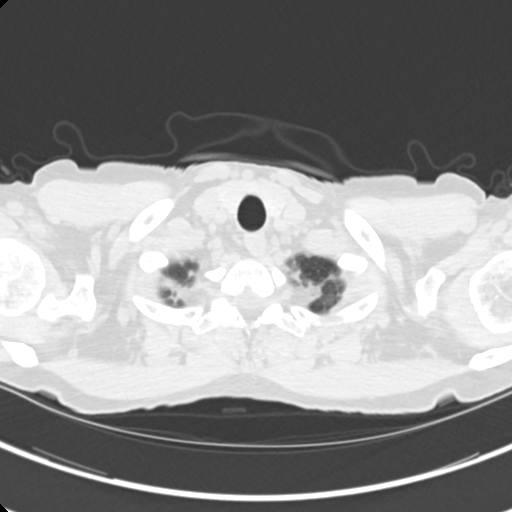

[Series 5: coronals chest 2.00 cor · coronal · 0.57mm/px · 3 of 122 slices shown]
[im 25/122  lung]
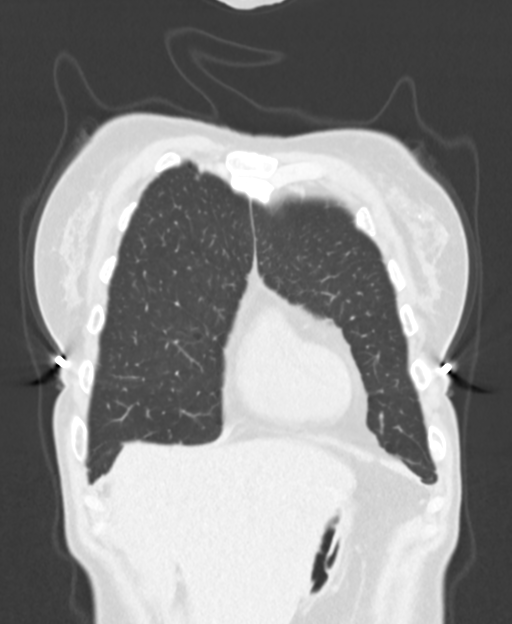
[im 49/122  lung]
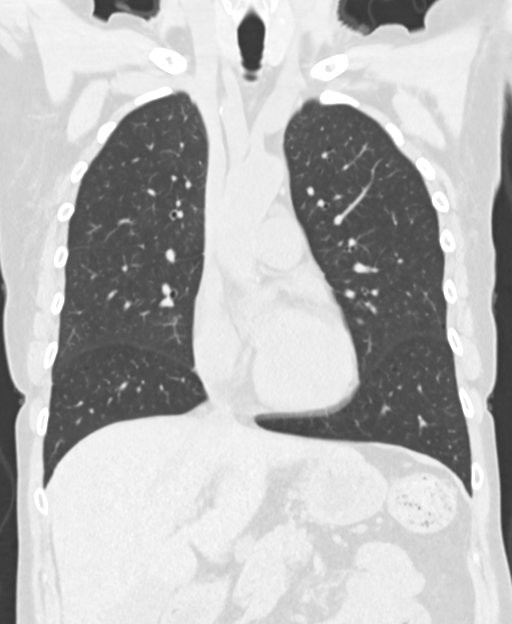
[im 73/122  lung]
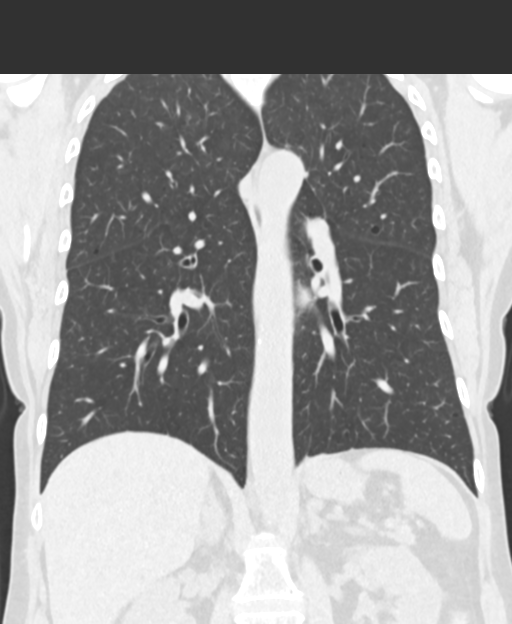

[15 of 36 positions shown; findings below may reference images not displayed]

FINDINGS: Cardiovascular: The thoracic aorta is normal in caliber. Minimal
aortic atherosclerosis. Normal heart size. Occasional coronary
artery calcifications. No pericardial effusion.

Mediastinum/Nodes: No enlarged mediastinal lymph nodes. Limited
assessment for hilar adenopathy on this noncontrast exam. No
suspicious thyroid nodule. There is no esophageal wall thickening.

Lungs/Pleura: Mild apical predominant emphysema. There is mild
biapical pleuroparenchymal scarring. 3 mm right upper lobe pulmonary
nodule, series 3, image 28. No dominant pulmonary mass. Mild central
bronchial thickening. No reticulation or fibrotic change. No pleural
fluid. No confluent consolidation. Minor dependent atelectasis.

Upper Abdomen: Small low-density lesions in the liver, these are
unchanged from prior abdominal MRI and most consistent with cysts.
Multiple left renal cysts that were previously characterized on MRI.
No acute upper abdominal findings.

Musculoskeletal: No focal bone lesion or acute osseous abnormality.
No chest wall soft tissue abnormality.
IMPRESSION: 1. Mild emphysema and bronchial thickening.
2. There is a 3 mm right upper lobe pulmonary nodule. Non-contrast
chest CT can be considered in 12 months if patient is high-risk.
This recommendation follows the consensus statement: Guidelines for
Management of Incidental Pulmonary Nodules Detected on CT Images:
3. Mild aortic atherosclerosis. Minimal coronary artery
calcifications.

Aortic Atherosclerosis ([XP]-[XP]) and Emphysema ([XP]-[XP]).

## 2021-07-14 ENCOUNTER — Other Ambulatory Visit: Payer: Self-pay

## 2021-07-14 DIAGNOSIS — R202 Paresthesia of skin: Secondary | ICD-10-CM

## 2021-07-16 ENCOUNTER — Encounter: Payer: Self-pay | Admitting: Neurology

## 2021-08-13 ENCOUNTER — Other Ambulatory Visit: Payer: Self-pay | Admitting: Internal Medicine

## 2021-08-13 DIAGNOSIS — Z1231 Encounter for screening mammogram for malignant neoplasm of breast: Secondary | ICD-10-CM

## 2021-08-13 DIAGNOSIS — R519 Headache, unspecified: Secondary | ICD-10-CM

## 2021-08-13 DIAGNOSIS — R911 Solitary pulmonary nodule: Secondary | ICD-10-CM

## 2021-08-20 ENCOUNTER — Other Ambulatory Visit: Payer: Self-pay

## 2021-08-20 ENCOUNTER — Ambulatory Visit
Admission: RE | Admit: 2021-08-20 | Discharge: 2021-08-20 | Disposition: A | Discharge status: Home / Self Care | Payer: BC Managed Care – PPO | Source: Ambulatory Visit | Attending: Internal Medicine | Admitting: Internal Medicine

## 2021-08-20 DIAGNOSIS — R519 Headache, unspecified: Secondary | ICD-10-CM | POA: Diagnosis present

## 2021-08-20 IMAGING — MR MR HEAD W/O CM
12 series · 43 of 48 positions shown · non-contrast
Comparison: Head CT [DATE].

CLINICAL DATA: Provided history: New onset of headaches. Additional
history provided by scanning technologist: Patient reports head
"hurts all over." Patient reports "knots popping up all over."
Memory changes. Difficulty concentrating. Patient reports having
COVID 3 times previously.

EXAM:
MRI HEAD WITHOUT CONTRAST
TECHNIQUE: Multiplanar, multiecho pulse sequences of the brain and surrounding
structures were obtained without intravenous contrast.

[Series 5: ax dwi_tracew · axial · 3.0mm · 0.65mm/px · z∈[-87,+65]mm · 3 of 48 slices shown]
[im 1/48]
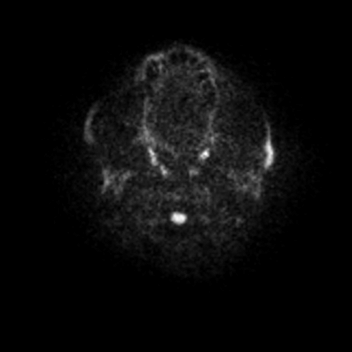
[im 24/48]
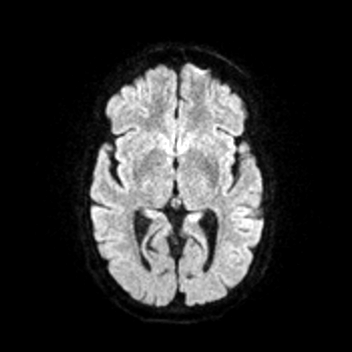
[im 48/48]
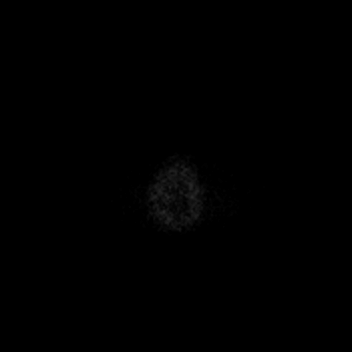

[Series 6: ax dwi_adc · axial · 3.0mm · 0.65mm/px · z∈[-87,+62]mm · 3 of 47 slices shown]
[im 1/47]
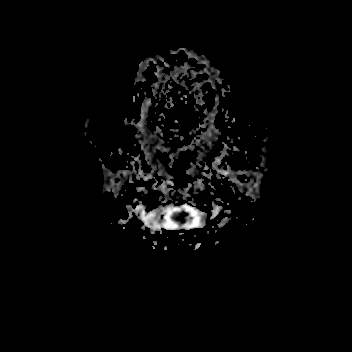
[im 24/47]
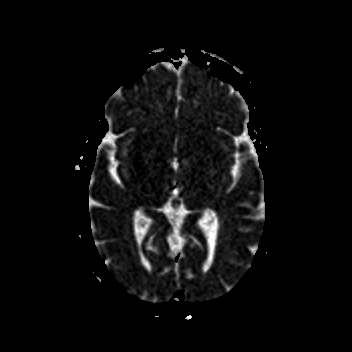
[im 47/47]
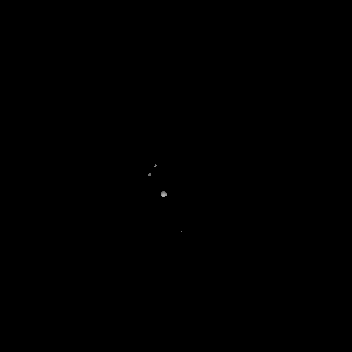

[Series 7: cor dwi_tracew · coronal · 5.0mm · 0.60mm/px · 5 of 76 slices shown]
[im 1/76]
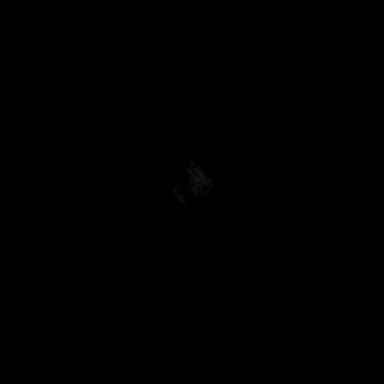
[im 19/76]
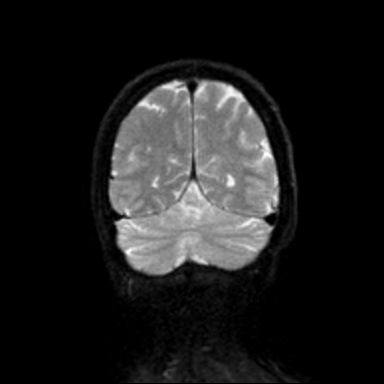
[im 38/76]
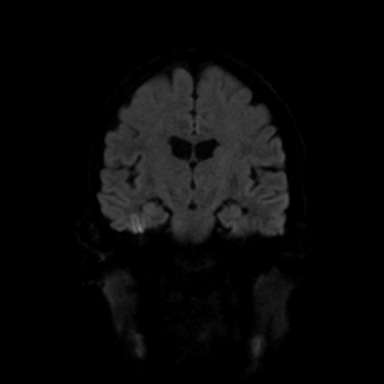
[im 57/76]
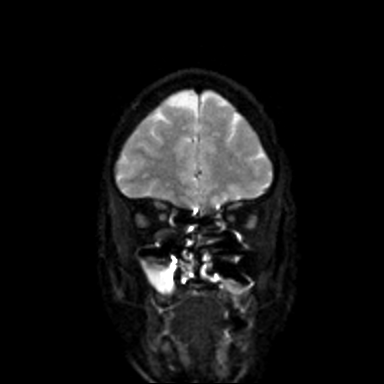
[im 76/76]
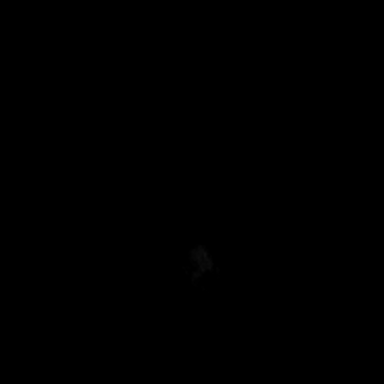

[Series 8: cor dwi_adc · coronal · 5.0mm · 0.60mm/px · 3 of 37 slices shown]
[im 1/37]
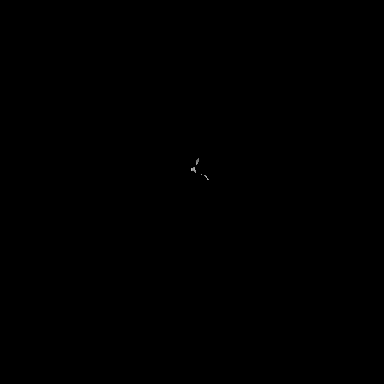
[im 19/37]
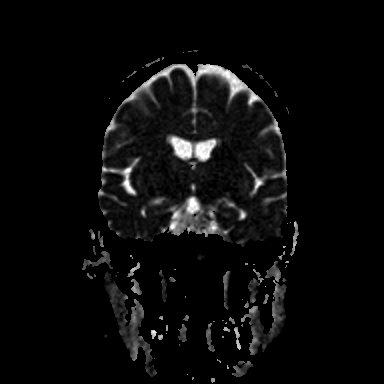
[im 37/37]
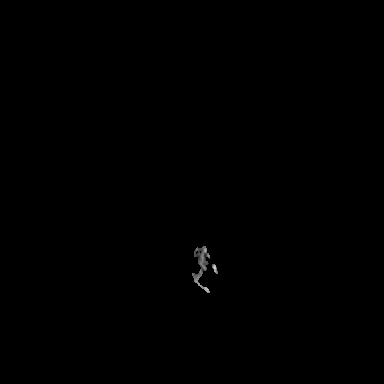

[Series 9: T1 · sagittal · 5.0mm · 0.62mm/px · 2 of 25 slices shown (1 of 2)]
[im 1/25]
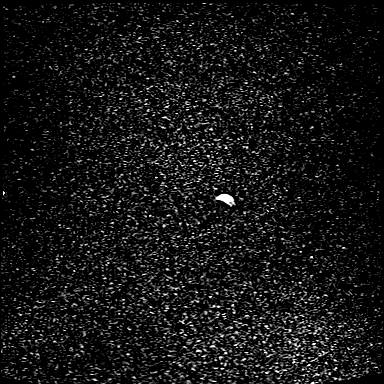
[im 25/25]
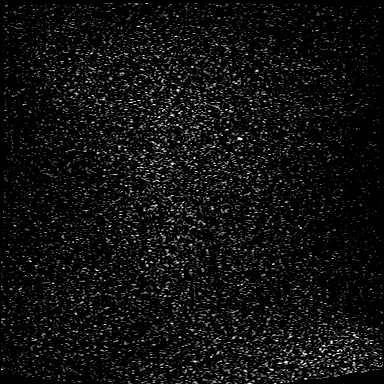

[Series 10: T2 · axial · 5.0mm · 0.53mm/px · z∈[-87,+66]mm · 2 of 27 slices shown (1 of 2)]
[im 1/27]
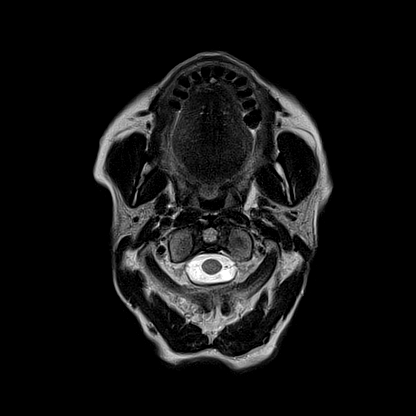
[im 27/27]
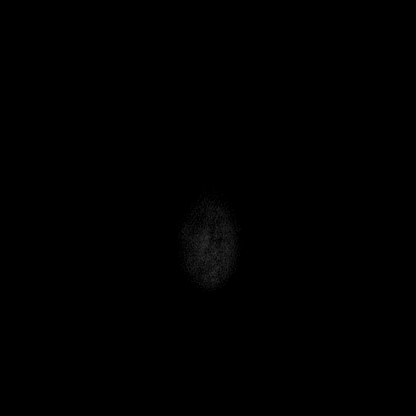

[Series 11: mag_images · axial · 3.0mm · 0.90mm/px · z∈[-97,+77]mm · 4 of 60 slices shown]
[im 1/60]
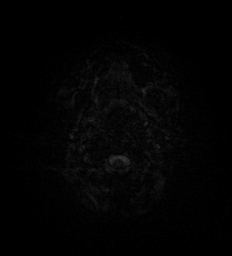
[im 20/60]
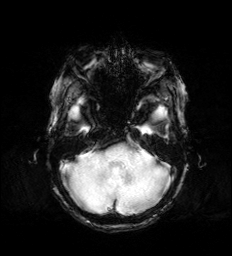
[im 40/60]
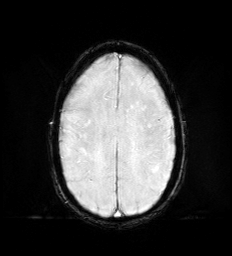
[im 60/60]
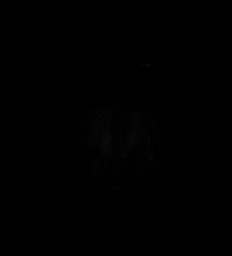

[Series 12: pha_images · axial · 3.0mm · 0.90mm/px · z∈[-97,+77]mm · 4 of 60 slices shown]
[im 1/60]
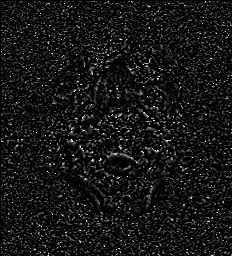
[im 20/60]
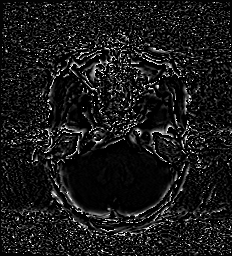
[im 40/60]
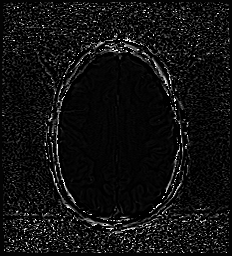
[im 60/60]
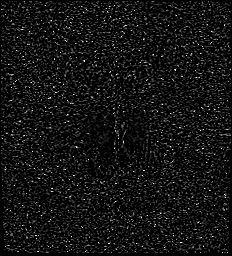

[Series 13: swi_images · axial · 3.0mm · 0.90mm/px · z∈[-97,+18]mm · 3 of 60 slices shown]
[im 1/60]
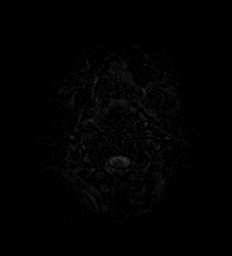
[im 20/60]
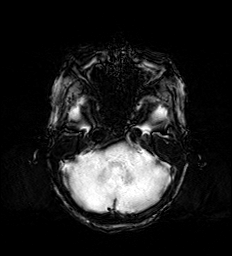
[im 40/60]
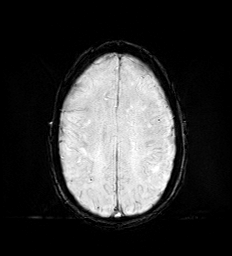

[Series 15: FLAIR · axial · 3.0mm · 0.53mm/px · z∈[-90,+69]mm · 4 of 55 slices shown]
[im 1/55]
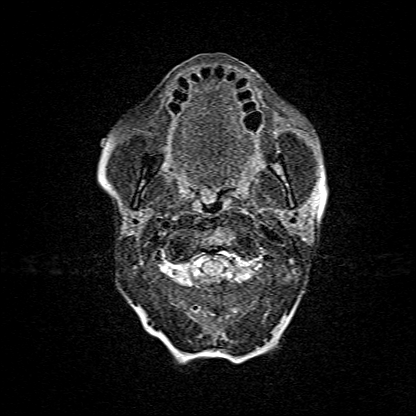
[im 19/55]
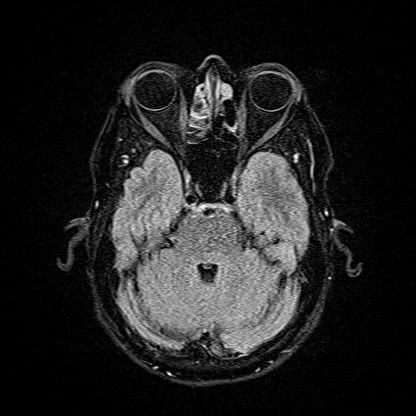
[im 37/55]
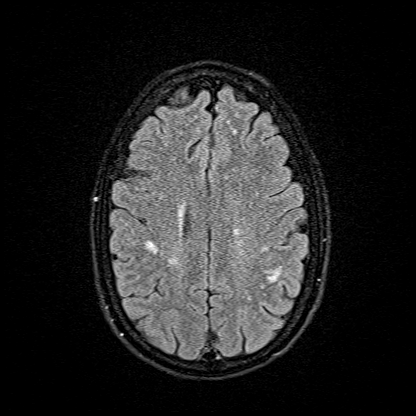
[im 55/55]
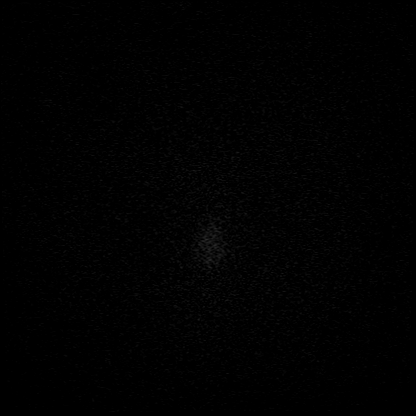

[Series 16: T1 · axial · 1.0mm · 0.98mm/px · z∈[-95,+77]mm · 8 of 176 slices shown (2 of 2)]
[im 1/176]
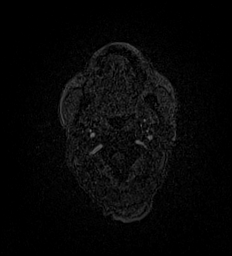
[im 32/176]
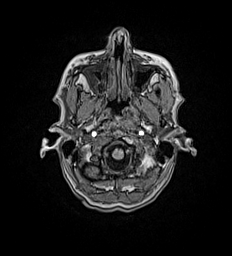
[im 48/176]
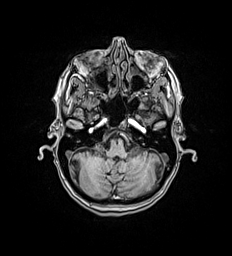
[im 80/176]
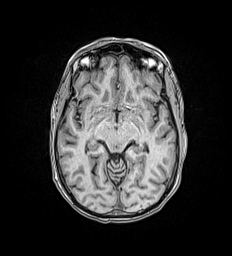
[im 96/176]
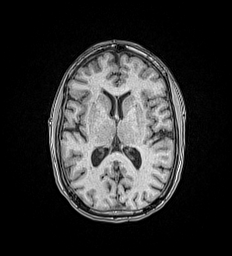
[im 128/176]
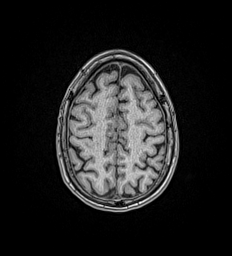
[im 144/176]
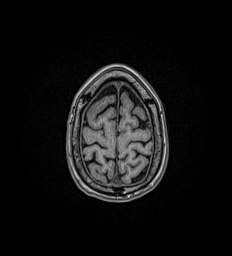
[im 176/176]
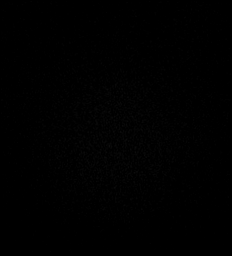

[Series 17: T2 · coronal · 5.0mm · 0.57mm/px · 2 of 29 slices shown (2 of 2)]
[im 1/29]
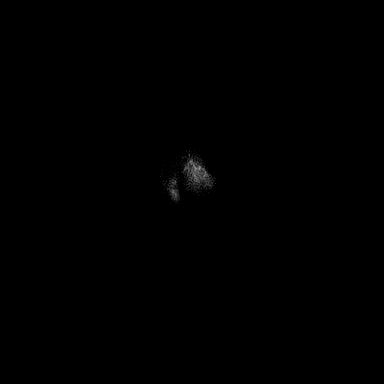
[im 29/29]
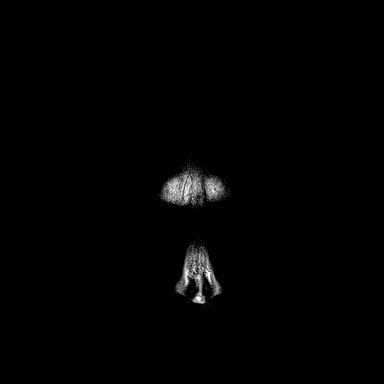

[43 of 48 positions shown; findings below may reference images not displayed]

FINDINGS: Brain:

No age advanced or lobar predominant atrophy.

Mild-to-moderate multifocal T2 FLAIR hyperintense signal abnormality
within the cerebral white matter, nonspecific but compatible with
chronic small vessel ischemic disease.

Punctate chronic microhemorrhage within the left thalamus.

There is no acute infarct.

No evidence of an intracranial mass.

No extra-axial fluid collection.

No midline shift.

Vascular: Maintained flow voids within the proximal large arterial
vessels.

Skull and upper cervical spine: No focal suspicious marrow lesion.

Sinuses/Orbits: Visualized orbits show no acute finding. Mild
mucosal thickening within the bilateral frontal sinuses. Overall
moderate mucosal thickening within the bilateral ethmoid air cells.
Mild mucosal thickening within the right sphenoid sinus. Moderate
mucosal thickening within the bilateral maxillary sinuses.

Other: Small-volume fluid within the right mastoid air cells.
IMPRESSION: No evidence of acute intracranial abnormality.

Mild-to-moderate chronic small vessel ischemic changes within the
cerebral white matter.

Otherwise unremarkable non-contrast MRI appearance of the brain for
age.

Paranasal sinus disease, as described.

Small-volume fluid within the right mastoid air cells.

## 2021-08-25 ENCOUNTER — Other Ambulatory Visit: Payer: Self-pay

## 2021-08-25 ENCOUNTER — Ambulatory Visit (INDEPENDENT_AMBULATORY_CARE_PROVIDER_SITE_OTHER): Payer: BC Managed Care – PPO | Admitting: Neurology

## 2021-08-25 DIAGNOSIS — R202 Paresthesia of skin: Secondary | ICD-10-CM

## 2021-08-25 DIAGNOSIS — G5603 Carpal tunnel syndrome, bilateral upper limbs: Secondary | ICD-10-CM

## 2021-08-25 NOTE — Procedures (Signed)
[facility]  ?[address], [address] ? [place], [place] [address] ?Tel: [phone] ?Fax:  [phone] ?Test Date:  08/25/2021 ? ?Patient: Regina Carroll DOB: 1961-04-08 Physician: Narda Amber, DO  ?Sex: Female Height: '5\' 1"'$  Ref Phys: Faythe Casa, MD  ?ID#: [id number]   Technician:   ? ?Patient Complaints: ?This is a 61 year old female referred for evaluation of bilateral hand paresthesias and pain. ? ?NCV & EMG Findings: ?Extensive electrodiagnostic testing of the right upper extremity and additional studies of the left shows:  ?Bilateral mixed palmar sensory responses show prolonged latency, worse on the right.  Bilateral median and ulnar sensory responses are within normal limits ?Bilateral median and ulnar motor responses are within normal limits.  Of note, there is evidence of a right Martin-Gruber anastomosis, a normal anatomic variant. ?There is no evidence of active or chronic motor axonal loss changes affecting any of the tested muscles.  Motor unit configuration and recruitment pattern is within normal limits. ? ?Impression: ?Bilateral median neuropathy at or distal to the wrist, consistent with a clinical diagnosis of carpal tunnel syndrome.  Overall, these findings are very mild in degree electrically. ?Incidentally, there is a right Martin-Gruber anastomosis, a normal anatomic ? ? ?___________________________ ?Narda Amber, DO ? ? ? ?Nerve Conduction Studies ?Anti Sensory Summary Table ? ? Stim Site NR Peak (ms) Norm Peak (ms) P-T Amp (?V) Norm P-T Amp  ?Left Median Anti Sensory (2nd Digit)  33?C  ?Wrist    3.0 <3.8 30.5 >10  ?Right Median Anti Sensory (2nd Digit)  33?C  ?Wrist    3.5 <3.8 21.4 >10  ?Left Ulnar Anti Sensory (5th Digit)  33?C  ?Wrist    2.7 <3.2 30.8 >5  ?Right Ulnar Anti Sensory (5th Digit)  33?C  ?Wrist    2.7 <3.2 34.5 >5  ? ?Motor Summary Table ? ? Stim Site NR Onset (ms) Norm Onset (ms) O-P Amp (mV) Norm O-P Amp Site1 Site2 Delta-0 (ms) Dist (cm) Vel (m/s) Norm Vel  (m/s)  ?Left Median Motor (Abd Poll Brev)  33?C  ?Wrist    2.7 <4.0 9.2 >5 Elbow Wrist 4.9 26.0 53 >50  ?Elbow    7.6  9.0         ?Right Median Motor (Abd Poll Brev)  33?C  ?Wrist    3.8 <4.0 6.4 >5 Elbow Wrist 4.4 25.0 57 >50  ?Elbow    8.2  6.4  Ulnar-wrist crossover Elbow 4.4 0.0    ?Ulnar-wrist crossover    3.8  5.5         ?Left Ulnar Motor (Abd Dig Minimi)  33?C  ?Wrist    2.4 <3.1 12.5 >7 B Elbow Wrist 3.4 21.0 62 >50  ?B Elbow    5.8  11.9  A Elbow B Elbow 1.6 10.0 62 >50  ?A Elbow    7.4  11.6         ?Right Ulnar Motor (Abd Dig Minimi)  33?C  ?Wrist    2.3 <3.1 10.7 >7 B Elbow Wrist 3.2 21.0 66 >50  ?B Elbow    5.5  9.6  A Elbow B Elbow 1.8 10.0 56 >50  ?A Elbow    7.3  9.1         ? ?Comparison Summary Table ? ? Stim Site NR Peak (ms) Norm Peak (ms) P-T Amp (?V) Site1 Site2 Delta-P (ms) Norm Delta (ms)  ?Left Median/Ulnar Palm Comparison (Wrist - 8cm)  33?C  ?Median Palm    1.8 <  2.2 69.9 Median Palm Ulnar Palm 0.5   ?Ulnar Palm    1.3 <2.2 49.2      ?Right Median/Ulnar Palm Comparison (Wrist - 8cm)  33?C  ?Median Palm    2.4 <2.2 85.6 Median Palm Ulnar Palm 0.8   ?Ulnar Palm    1.6 <2.2 22.3      ? ?EMG ? ? Side Muscle Ins Act Fibs Psw Fasc Number Recrt Dur Dur. Amp Amp. Poly Poly. Comment  ?Right Abd Poll Brev Nml Nml Nml Nml Nml Nml Nml Nml Nml Nml Nml Nml N/A  ?Right 1stDorInt Nml Nml Nml Nml Nml Nml Nml Nml Nml Nml Nml Nml N/A  ?Right PronatorTeres Nml Nml Nml Nml Nml Nml Nml Nml Nml Nml Nml Nml N/A  ?Right Biceps Nml Nml Nml Nml Nml Nml Nml Nml Nml Nml Nml Nml N/A  ?Right Triceps Nml Nml Nml Nml Nml Nml Nml Nml Nml Nml Nml Nml N/A  ?Right Deltoid Nml Nml Nml Nml Nml Nml Nml Nml Nml Nml Nml Nml N/A  ?Left 1stDorInt Nml Nml Nml Nml Nml Nml Nml Nml Nml Nml Nml Nml N/A  ?Left Abd Poll Brev Nml Nml Nml Nml Nml Nml Nml Nml Nml Nml Nml Nml N/A  ?Left PronatorTeres Nml Nml Nml Nml Nml Nml Nml Nml Nml Nml Nml Nml N/A  ?Left Biceps Nml Nml Nml Nml Nml Nml Nml Nml Nml Nml Nml Nml N/A  ?Left Triceps Nml Nml Nml  Nml Nml Nml Nml Nml Nml Nml Nml Nml N/A  ?Left Deltoid Nml Nml Nml Nml Nml Nml Nml Nml Nml Nml Nml Nml N/A  ? ? ? ? ?Waveforms: ?    ? ?    ? ?    ? ?  ? ? ?

## 2021-08-31 DIAGNOSIS — G5602 Carpal tunnel syndrome, left upper limb: Secondary | ICD-10-CM | POA: Insufficient documentation

## 2021-08-31 DIAGNOSIS — M7582 Other shoulder lesions, left shoulder: Secondary | ICD-10-CM | POA: Insufficient documentation

## 2021-08-31 DIAGNOSIS — M47812 Spondylosis without myelopathy or radiculopathy, cervical region: Secondary | ICD-10-CM | POA: Insufficient documentation

## 2021-08-31 DIAGNOSIS — M65331 Trigger finger, right middle finger: Secondary | ICD-10-CM | POA: Insufficient documentation

## 2021-09-23 ENCOUNTER — Ambulatory Visit
Admission: RE | Admit: 2021-09-23 | Discharge: 2021-09-23 | Disposition: A | Discharge status: Home / Self Care | Payer: BC Managed Care – PPO | Source: Ambulatory Visit | Attending: Internal Medicine | Admitting: Internal Medicine

## 2021-09-23 DIAGNOSIS — Z1231 Encounter for screening mammogram for malignant neoplasm of breast: Secondary | ICD-10-CM | POA: Insufficient documentation

## 2021-09-23 IMAGING — MG MM DIGITAL SCREENING BILAT W/ TOMO AND CAD
8 series · 9 of 24 positions shown · non-contrast
Comparison: Previous exam(s).

CLINICAL DATA: Screening.

EXAM:
DIGITAL SCREENING BILATERAL MAMMOGRAM WITH TOMOSYNTHESIS AND CAD
TECHNIQUE: Bilateral screening digital craniocaudal and mediolateral oblique
mammograms were obtained. Bilateral screening digital breast
tomosynthesis was performed. The images were evaluated with
computer-aided detection.

[L MLO synth-2D]
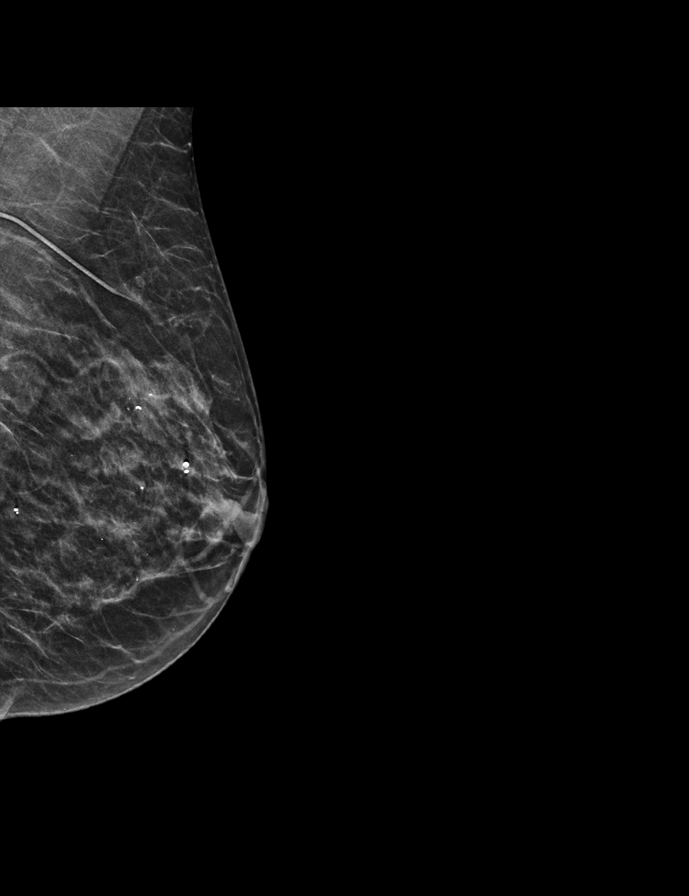

[L CC synth-2D]
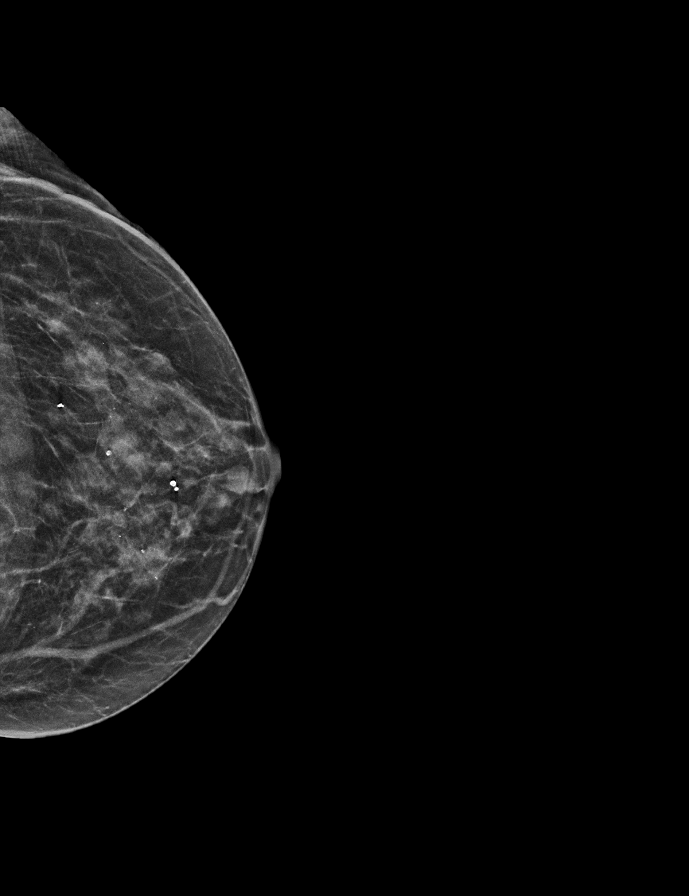

[R CC synth-2D]
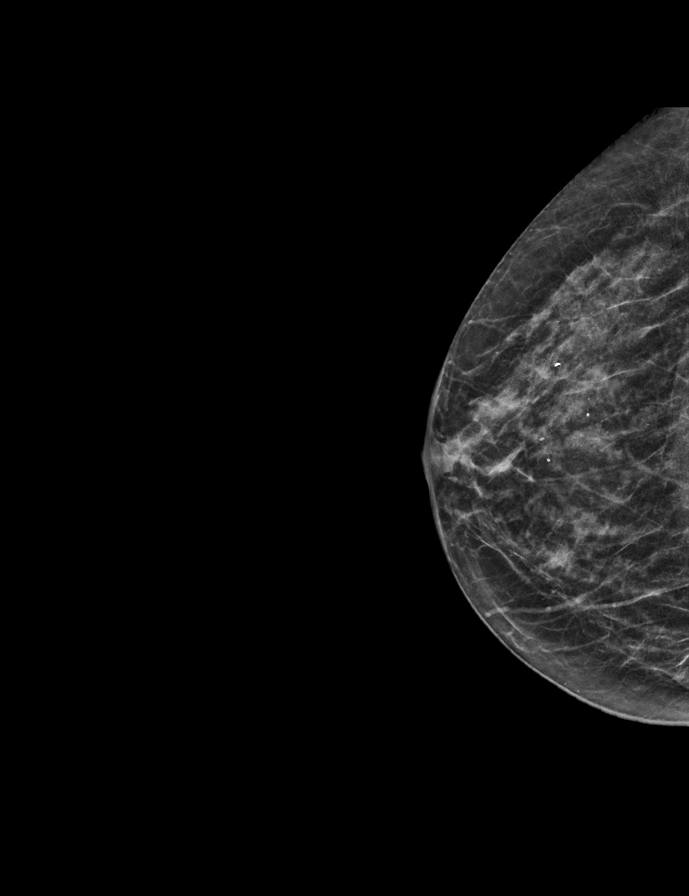

[R MLO synth-2D]
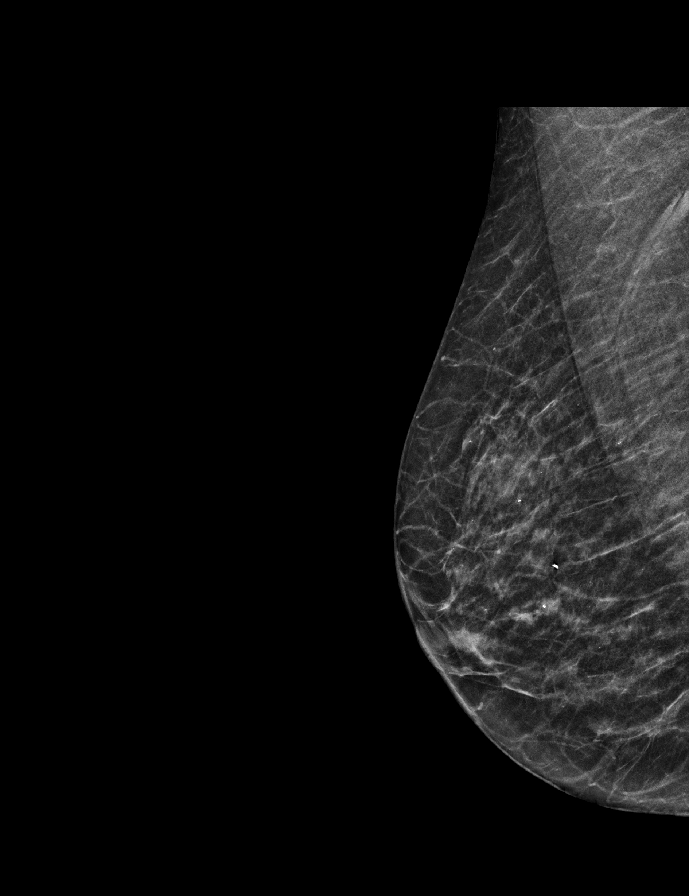

[L MLO tomo · 2 of 34 frames shown]
[frame 12/34]
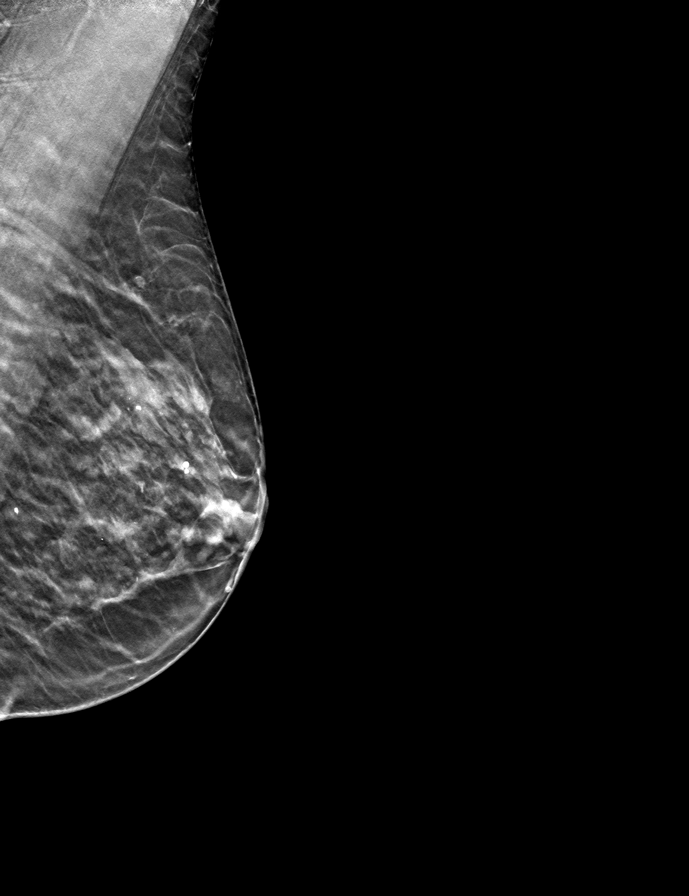
[frame 17/34]
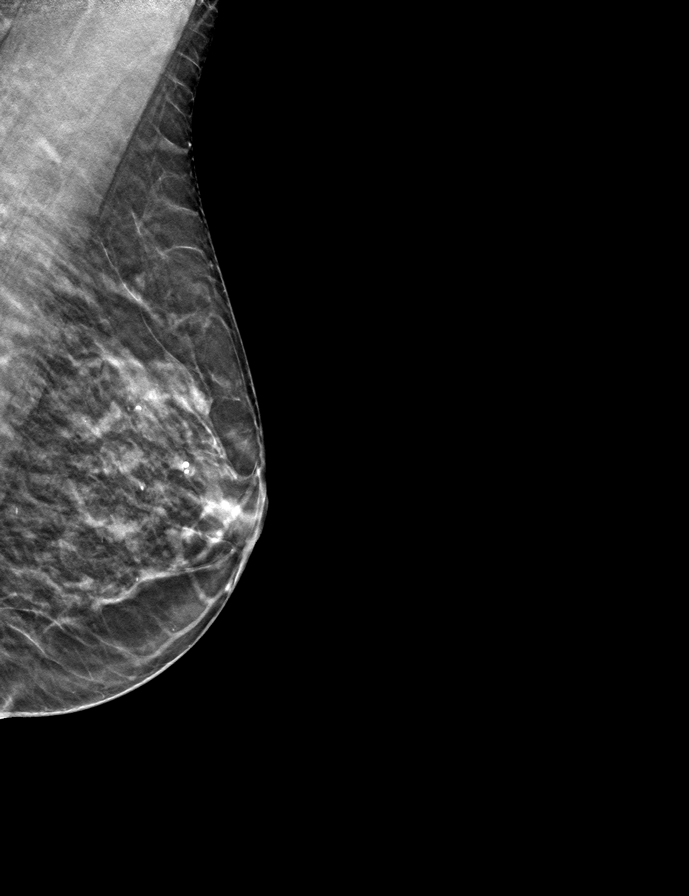

[R CC tomo · tomo slice 19/37.0]
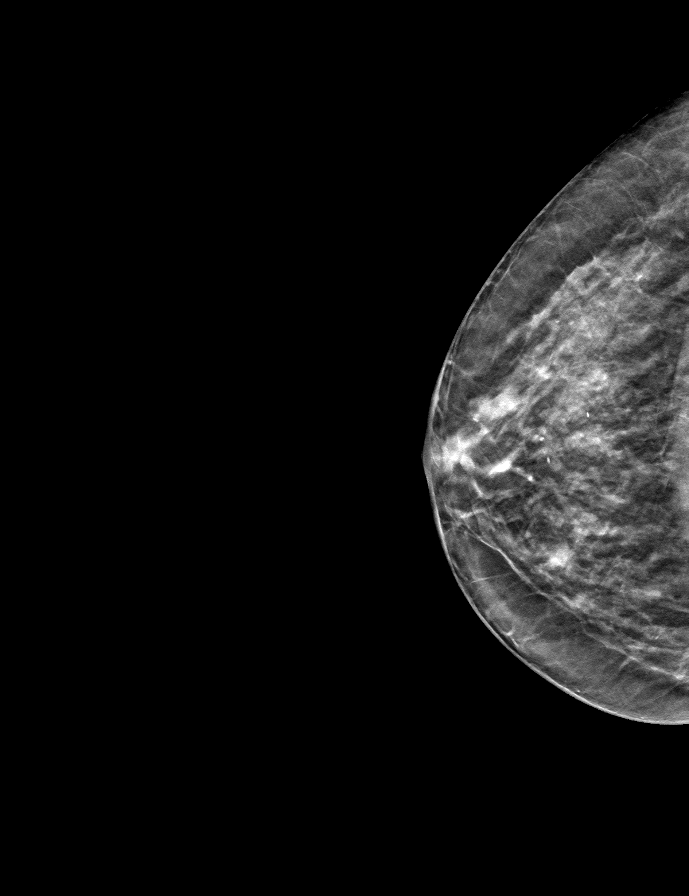

[R MLO tomo · tomo slice 19/36.0]
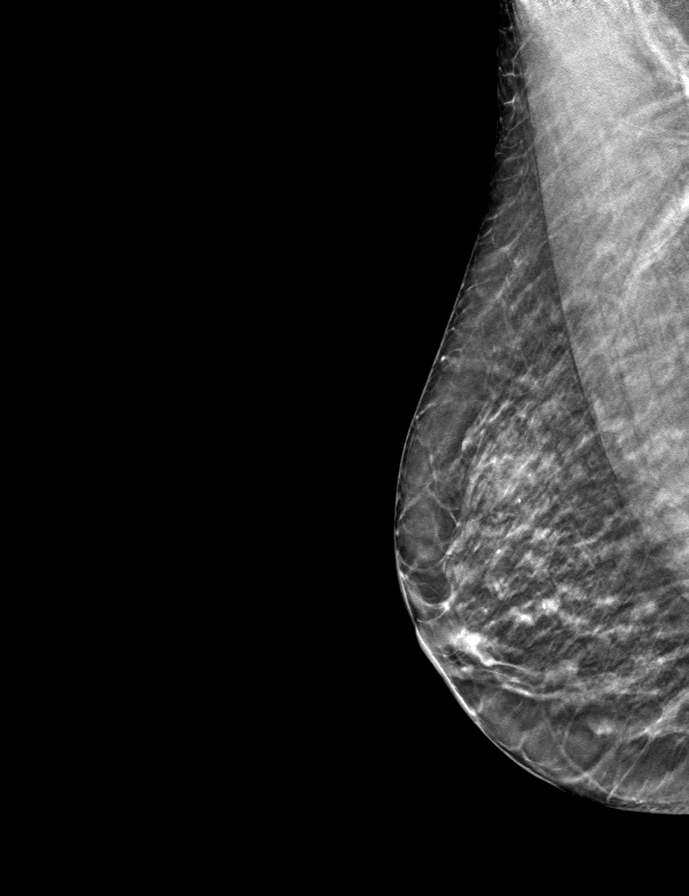

[L CC tomo · tomo slice 19/36.0]
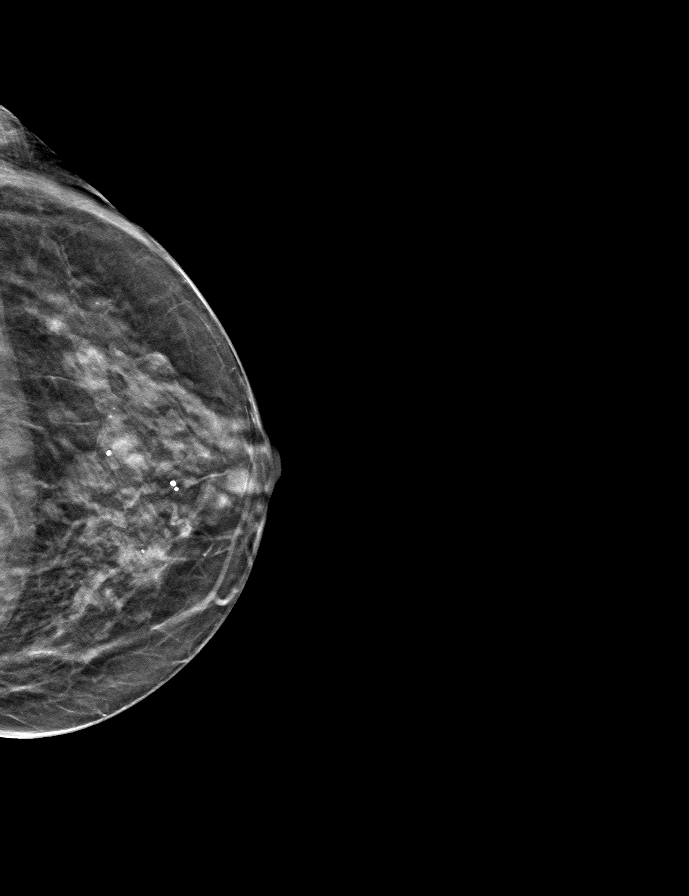

[9 of 24 positions shown; findings below may reference images not displayed]

ACR Breast Density Category c: The breast tissue is heterogeneously
dense, which may obscure small masses.
FINDINGS: There are no findings suspicious for malignancy.
IMPRESSION: No mammographic evidence of malignancy. A result letter of this
screening mammogram will be mailed directly to the patient.

RECOMMENDATION:
Screening mammogram in one year. (Code:[V2])

BI-RADS CATEGORY  1: Negative.

## 2021-12-02 ENCOUNTER — Ambulatory Visit: Payer: BC Managed Care – PPO | Attending: Internal Medicine

## 2021-12-11 ENCOUNTER — Other Ambulatory Visit: Payer: Self-pay | Admitting: Surgery

## 2021-12-18 ENCOUNTER — Ambulatory Visit
Admission: RE | Admit: 2021-12-18 | Discharge: 2021-12-18 | Disposition: A | Discharge status: Home / Self Care | Payer: BC Managed Care – PPO | Source: Ambulatory Visit | Attending: Internal Medicine | Admitting: Internal Medicine

## 2021-12-18 DIAGNOSIS — R911 Solitary pulmonary nodule: Secondary | ICD-10-CM

## 2021-12-28 ENCOUNTER — Other Ambulatory Visit: Payer: Self-pay

## 2021-12-28 ENCOUNTER — Encounter
Admission: RE | Admit: 2021-12-28 | Discharge: 2021-12-28 | Disposition: A | Discharge status: Home / Self Care | Payer: BC Managed Care – PPO | Source: Ambulatory Visit | Attending: Surgery | Admitting: Surgery

## 2021-12-28 VITALS — BP 121/66 | HR 61 | Resp 15 | Ht 61.5 in | Wt 101.0 lb

## 2021-12-28 DIAGNOSIS — Z01818 Encounter for other preprocedural examination: Secondary | ICD-10-CM | POA: Diagnosis present

## 2021-12-28 DIAGNOSIS — Z79899 Other long term (current) drug therapy: Secondary | ICD-10-CM | POA: Diagnosis not present

## 2021-12-28 DIAGNOSIS — Z01810 Encounter for preprocedural cardiovascular examination: Secondary | ICD-10-CM | POA: Diagnosis not present

## 2021-12-28 DIAGNOSIS — I10 Essential (primary) hypertension: Secondary | ICD-10-CM | POA: Diagnosis not present

## 2021-12-28 HISTORY — DX: Family history of other specified conditions: Z84.89

## 2021-12-28 HISTORY — DX: Gastro-esophageal reflux disease without esophagitis: K21.9

## 2021-12-28 LAB — POTASSIUM: Potassium: 3.8 mmol/L (ref 3.5–5.1)

## 2021-12-28 NOTE — Patient Instructions (Signed)
Your procedure is scheduled on: 01/06/22 Report to [place]. To find out your arrival time please call [phone] between 1PM - 3PM on 01/05/22.  Remember: Instructions that are not followed completely may result in serious medical risk, up to and including death, or upon the discretion of your surgeon and anesthesiologist your surgery may need to be rescheduled.     _X__ 1. Do not eat food after midnight the night before your procedure.                 No gum chewing or hard candies. You may drink clear liquids up to 2 hours                 before you are scheduled to arrive for your surgery- DO not drink clear                 liquids within 2 hours of the start of your surgery.                 Clear Liquids include:  water, apple juice without pulp, clear carbohydrate                 drink such as Clearfast or Gatorade, Black Coffee or Tea (Do not add                 anything to coffee or tea). Diabetics water only  __X__2.  On the morning of surgery brush your teeth with toothpaste and water, you                 may rinse your mouth with mouthwash if you wish.  Do not swallow any              toothpaste of mouthwash.     _X__ 3.  No Alcohol for 24 hours before or after surgery.   _X__ 4.  Do Not Smoke or use e-cigarettes For 24 Hours Prior to Your Surgery.                 Do not use any chewable tobacco products for at least 6 hours prior to                 surgery.  ____  5.  Bring all medications with you on the day of surgery if instructed.   __X__  6.  Notify your doctor if there is any change in your medical condition      (cold, fever, infections).     Do not wear jewelry, make-up, hairpins, clips or nail polish. Do not wear lotions, powders, or perfumes.  Do not shave body hair 48 hours prior to surgery. Men may shave face and neck. Do not bring valuables to the hospital.    [facility] is not responsible for any  belongings or valuables.  Contacts, dentures/partials or body piercings may not be worn into surgery. Bring a case for your contacts, glasses or hearing aids, a denture cup will be supplied. Leave your suitcase in the car. After surgery it may be brought to your room. For patients admitted to the hospital, discharge time is determined by your treatment team.   Patients discharged the day of surgery will not be allowed to drive home.   Please read over the following fact sheets that you were given:   MRSA Information, CHG soap, Ensure, Incentive Spirometer  __X__ Take these medicines the morning of surgery  with A SIP OF WATER:    1. citalopram (CELEXA) 20 MG tablet  2. omeprazole (PRILOSEC OTC) 20 MG tablet  3. allopurinol (ZYLOPRIM) 100 MG tablet  4.  5.  6.  ____ Fleet Enema (as directed)   __X__ Use CHG Soap/SAGE wipes as directed  __X__ Use inhalers on the day of surgery  ____ Stop metformin/Janumet/Farxiga 2 days prior to surgery    ____ Take 1/2 of usual insulin dose the night before surgery. No insulin the morning          of surgery.   ____ Stop Blood Thinners Coumadin/Plavix/Xarelto/Pleta/Pradaxa/Eliquis/Effient/Aspirin  on   Or contact your Surgeon, Cardiologist or Medical Doctor regarding  ability to stop your blood thinners  __X__ Stop Anti-inflammatories 7 days before surgery such as Advil, Ibuprofen, Motrin,  BC or Goodies Powder, Naprosyn, Naproxen, Aleve, Aspirin    __X__ Stop all herbals and supplements, fish oil or vitamins for 7 days until after surgery.    ____ Bring C-Pap to the hospital.   YOU MAY USE TYLENOL IF NEEDED FOR PAIN

## 2022-01-06 ENCOUNTER — Other Ambulatory Visit: Payer: Self-pay

## 2022-01-06 ENCOUNTER — Ambulatory Visit: Payer: BC Managed Care – PPO | Admitting: Urgent Care

## 2022-01-06 ENCOUNTER — Encounter: Payer: Self-pay | Admitting: Surgery

## 2022-01-06 ENCOUNTER — Ambulatory Visit
Admission: RE | Admit: 2022-01-06 | Discharge: 2022-01-06 | Disposition: A | Discharge status: Home / Self Care | Payer: BC Managed Care – PPO | Attending: Surgery | Admitting: Surgery

## 2022-01-06 ENCOUNTER — Encounter
Admission: RE | Disposition: A | Discharge status: Home / Self Care | Payer: Self-pay | Source: Home / Self Care | Attending: Surgery

## 2022-01-06 ENCOUNTER — Ambulatory Visit: Payer: BC Managed Care – PPO | Admitting: Certified Registered"

## 2022-01-06 DIAGNOSIS — F32A Depression, unspecified: Secondary | ICD-10-CM | POA: Insufficient documentation

## 2022-01-06 DIAGNOSIS — F17210 Nicotine dependence, cigarettes, uncomplicated: Secondary | ICD-10-CM | POA: Insufficient documentation

## 2022-01-06 DIAGNOSIS — J449 Chronic obstructive pulmonary disease, unspecified: Secondary | ICD-10-CM | POA: Diagnosis not present

## 2022-01-06 DIAGNOSIS — F419 Anxiety disorder, unspecified: Secondary | ICD-10-CM | POA: Diagnosis not present

## 2022-01-06 DIAGNOSIS — K219 Gastro-esophageal reflux disease without esophagitis: Secondary | ICD-10-CM | POA: Diagnosis not present

## 2022-01-06 DIAGNOSIS — M65331 Trigger finger, right middle finger: Secondary | ICD-10-CM | POA: Diagnosis not present

## 2022-01-06 DIAGNOSIS — G5601 Carpal tunnel syndrome, right upper limb: Secondary | ICD-10-CM | POA: Diagnosis present

## 2022-01-06 DIAGNOSIS — I10 Essential (primary) hypertension: Secondary | ICD-10-CM | POA: Diagnosis not present

## 2022-01-06 DIAGNOSIS — M109 Gout, unspecified: Secondary | ICD-10-CM | POA: Diagnosis not present

## 2022-01-06 HISTORY — PX: CARPAL TUNNEL RELEASE: SHX101

## 2022-01-06 SURGERY — RELEASE, CARPAL TUNNEL, ENDOSCOPIC
Anesthesia: General | Site: Wrist | Laterality: Right

## 2022-01-06 MED ORDER — PHENYLEPHRINE 80 MCG/ML (10ML) SYRINGE FOR IV PUSH (FOR BLOOD PRESSURE SUPPORT)
PREFILLED_SYRINGE | INTRAVENOUS | Status: AC
  Filled 2022-01-06: qty 10

## 2022-01-06 MED ORDER — CHLORHEXIDINE GLUCONATE 0.12 % MT SOLN
OROMUCOSAL | Status: AC
  Administered 2022-01-06: 15 mL via OROMUCOSAL
  Filled 2022-01-06: qty 15

## 2022-01-06 MED ORDER — PROPOFOL 10 MG/ML IV BOLUS
INTRAVENOUS | Status: AC
  Filled 2022-01-06: qty 20

## 2022-01-06 MED ORDER — ACETAMINOPHEN 325 MG PO TABS
650.0000 mg | ORAL_TABLET | Freq: Once | ORAL | Status: AC
  Administered 2022-01-06: 650 mg via ORAL

## 2022-01-06 MED ORDER — MIDAZOLAM HCL 2 MG/2ML IJ SOLN
INTRAMUSCULAR | Status: AC
  Filled 2022-01-06: qty 2

## 2022-01-06 MED ORDER — GLYCOPYRROLATE PF 0.2 MG/ML IJ SOSY
PREFILLED_SYRINGE | INTRAMUSCULAR | Status: DC | PRN
  Administered 2022-01-06: .2 mg via INTRAVENOUS

## 2022-01-06 MED ORDER — OXYCODONE HCL 5 MG PO TABS: 5.0000 mg | ORAL_TABLET | Freq: Once | ORAL | Status: DC | PRN

## 2022-01-06 MED ORDER — CHLORHEXIDINE GLUCONATE 0.12 % MT SOLN: 15.0000 mL | Freq: Once | OROMUCOSAL | Status: AC

## 2022-01-06 MED ORDER — CEFAZOLIN SODIUM-DEXTROSE 2-4 GM/100ML-% IV SOLN
INTRAVENOUS | Status: AC
  Filled 2022-01-06: qty 100

## 2022-01-06 MED ORDER — CEFAZOLIN SODIUM-DEXTROSE 2-4 GM/100ML-% IV SOLN
2.0000 g | INTRAVENOUS | Status: AC
  Administered 2022-01-06: 1.5 g via INTRAVENOUS

## 2022-01-06 MED ORDER — DEXAMETHASONE SODIUM PHOSPHATE 10 MG/ML IJ SOLN
INTRAMUSCULAR | Status: DC | PRN
  Administered 2022-01-06: 10 mg via INTRAVENOUS

## 2022-01-06 MED ORDER — LIDOCAINE HCL (CARDIAC) PF 100 MG/5ML IV SOSY
PREFILLED_SYRINGE | INTRAVENOUS | Status: DC | PRN
  Administered 2022-01-06: 40 mg via INTRAVENOUS

## 2022-01-06 MED ORDER — LIDOCAINE HCL (PF) 2 % IJ SOLN
INTRAMUSCULAR | Status: AC
  Filled 2022-01-06: qty 5

## 2022-01-06 MED ORDER — KETOROLAC TROMETHAMINE 30 MG/ML IJ SOLN
INTRAMUSCULAR | Status: AC
  Filled 2022-01-06: qty 1

## 2022-01-06 MED ORDER — OXYCODONE HCL 5 MG/5ML PO SOLN: 5.0000 mg | Freq: Once | ORAL | Status: DC | PRN

## 2022-01-06 MED ORDER — DEXAMETHASONE SODIUM PHOSPHATE 10 MG/ML IJ SOLN
INTRAMUSCULAR | Status: AC
  Filled 2022-01-06: qty 1

## 2022-01-06 MED ORDER — ACETAMINOPHEN 325 MG PO TABS
ORAL_TABLET | ORAL | Status: AC
  Filled 2022-01-06: qty 2

## 2022-01-06 MED ORDER — STERILE WATER FOR IRRIGATION IR SOLN
Status: DC | PRN
  Administered 2022-01-06: 500 mL

## 2022-01-06 MED ORDER — HYDROCODONE-ACETAMINOPHEN 5-325 MG PO TABS: 1.0000 | ORAL_TABLET | Freq: Four times a day (QID) | ORAL | 0 refills | Status: DC | PRN

## 2022-01-06 MED ORDER — PROMETHAZINE HCL 25 MG/ML IJ SOLN: 6.2500 mg | INTRAMUSCULAR | Status: DC | PRN

## 2022-01-06 MED ORDER — MIDAZOLAM HCL 2 MG/2ML IJ SOLN
INTRAMUSCULAR | Status: DC | PRN
  Administered 2022-01-06: 2 mg via INTRAVENOUS

## 2022-01-06 MED ORDER — GLYCOPYRROLATE 0.2 MG/ML IJ SOLN
INTRAMUSCULAR | Status: AC
  Filled 2022-01-06: qty 1

## 2022-01-06 MED ORDER — FENTANYL CITRATE (PF) 100 MCG/2ML IJ SOLN
25.0000 ug | INTRAMUSCULAR | Status: DC | PRN
  Administered 2022-01-06: 25 ug via INTRAVENOUS
  Administered 2022-01-06: 50 ug via INTRAVENOUS

## 2022-01-06 MED ORDER — ONDANSETRON HCL 4 MG/2ML IJ SOLN
INTRAMUSCULAR | Status: DC | PRN
  Administered 2022-01-06: 4 mg via INTRAVENOUS

## 2022-01-06 MED ORDER — FENTANYL CITRATE (PF) 100 MCG/2ML IJ SOLN
INTRAMUSCULAR | Status: AC
  Filled 2022-01-06: qty 2

## 2022-01-06 MED ORDER — PROPOFOL 10 MG/ML IV BOLUS
INTRAVENOUS | Status: DC | PRN
  Administered 2022-01-06: 120 mg via INTRAVENOUS

## 2022-01-06 MED ORDER — FENTANYL CITRATE (PF) 100 MCG/2ML IJ SOLN
INTRAMUSCULAR | Status: AC
  Administered 2022-01-06: 25 ug via INTRAVENOUS
  Filled 2022-01-06: qty 2

## 2022-01-06 MED ORDER — DROPERIDOL 2.5 MG/ML IJ SOLN: 0.6250 mg | Freq: Once | INTRAMUSCULAR | Status: DC | PRN

## 2022-01-06 MED ORDER — BUPIVACAINE HCL (PF) 0.5 % IJ SOLN
INTRAMUSCULAR | Status: DC | PRN
  Administered 2022-01-06: 10 mL

## 2022-01-06 MED ORDER — LACTATED RINGERS IV SOLN: INTRAVENOUS | Status: DC

## 2022-01-06 MED ORDER — PHENYLEPHRINE HCL (PRESSORS) 10 MG/ML IV SOLN
INTRAVENOUS | Status: DC | PRN
  Administered 2022-01-06: 80 ug via INTRAVENOUS

## 2022-01-06 MED ORDER — ORAL CARE MOUTH RINSE: 15.0000 mL | Freq: Once | OROMUCOSAL | Status: AC

## 2022-01-06 MED ORDER — BUPIVACAINE HCL (PF) 0.5 % IJ SOLN
INTRAMUSCULAR | Status: AC
  Filled 2022-01-06: qty 30

## 2022-01-06 MED ORDER — FENTANYL CITRATE (PF) 100 MCG/2ML IJ SOLN
INTRAMUSCULAR | Status: DC | PRN
  Administered 2022-01-06: 50 ug via INTRAVENOUS
  Administered 2022-01-06: 25 ug via INTRAVENOUS

## 2022-01-06 MED ORDER — ONDANSETRON HCL 4 MG/2ML IJ SOLN
INTRAMUSCULAR | Status: AC
  Filled 2022-01-06: qty 2

## 2022-01-06 SURGICAL SUPPLY — 32 items
APL PRP STRL LF DISP 70% ISPRP (MISCELLANEOUS) ×1
BNDG COHESIVE 4X5 TAN ST LF (GAUZE/BANDAGES/DRESSINGS) ×2 IMPLANT
BNDG ELASTIC 2X5.8 VLCR STR LF (GAUZE/BANDAGES/DRESSINGS) ×2 IMPLANT
BNDG ESMARK 4X12 TAN STRL LF (GAUZE/BANDAGES/DRESSINGS) ×2 IMPLANT
CHLORAPREP W/TINT 26 (MISCELLANEOUS) ×2 IMPLANT
CORD BIP STRL DISP 12FT (MISCELLANEOUS) ×2 IMPLANT
CUFF TOURN SGL QUICK 18X4 (TOURNIQUET CUFF) ×1 IMPLANT
DRAPE SURG 17X11 SM STRL (DRAPES) ×2 IMPLANT
FORCEPS JEWEL BIP 4-3/4 STR (INSTRUMENTS) ×2 IMPLANT
GAUZE SPONGE 4X4 12PLY STRL (GAUZE/BANDAGES/DRESSINGS) ×2 IMPLANT
GAUZE XEROFORM 1X8 LF (GAUZE/BANDAGES/DRESSINGS) ×2 IMPLANT
GLOVE BIO SURGEON STRL SZ8 (GLOVE) ×2 IMPLANT
GLOVE SURG UNDER LTX SZ8 (GLOVE) ×2 IMPLANT
GOWN STRL REUS W/ TWL LRG LVL3 (GOWN DISPOSABLE) ×1 IMPLANT
GOWN STRL REUS W/ TWL XL LVL3 (GOWN DISPOSABLE) ×1 IMPLANT
GOWN STRL REUS W/TWL LRG LVL3 (GOWN DISPOSABLE) ×2
GOWN STRL REUS W/TWL XL LVL3 (GOWN DISPOSABLE) ×2
KIT CARPAL TUNNEL (MISCELLANEOUS) ×2
KIT ESCP INSRT D SLOT CANN KN (MISCELLANEOUS) ×1 IMPLANT
KIT TURNOVER KIT A (KITS) ×2 IMPLANT
MANIFOLD NEPTUNE II (INSTRUMENTS) ×2 IMPLANT
NS IRRIG 500ML POUR BTL (IV SOLUTION) ×2 IMPLANT
PACK EXTREMITY ARMC (MISCELLANEOUS) ×2 IMPLANT
SPLINT WRIST LG LT TX990309 (SOFTGOODS) IMPLANT
SPLINT WRIST LG RT TX900304 (SOFTGOODS) IMPLANT
SPLINT WRIST M LT TX990308 (SOFTGOODS) IMPLANT
SPLINT WRIST M RT TX990303 (SOFTGOODS) ×1 IMPLANT
SPLINT WRIST XL LT TX990310 (SOFTGOODS) IMPLANT
SPLINT WRIST XL RT TX990305 (SOFTGOODS) IMPLANT
STOCKINETTE IMPERVIOUS 9X36 MD (GAUZE/BANDAGES/DRESSINGS) ×2 IMPLANT
SUT PROLENE 4 0 PS 2 18 (SUTURE) ×2 IMPLANT
WATER STERILE IRR 500ML POUR (IV SOLUTION) ×2 IMPLANT

## 2022-01-06 NOTE — Discharge Instructions (Addendum)
Orthopedic discharge instructions: Keep dressing dry and intact. Keep hand elevated above heart level. May shower after dressing removed on postop day 4 (Sunday). Cover sutures with Band-Aids after drying off. Apply ice to affected area frequently. Take ibuprofen 600-800 mg TID with meals for 5-7 days, then as necessary. Take ES Tylenol or pain medication as prescribed when needed.  Return for follow-up in 10-14 days or as scheduled.  AMBULATORY SURGERY  DISCHARGE INSTRUCTIONS   The drugs that you were given will stay in your system until tomorrow so for the next 24 hours you should not:  Drive an automobile Make any legal decisions Drink any alcoholic beverage   You may resume regular meals tomorrow.  Today it is better to start with liquids and gradually work up to solid foods.  You may eat anything you prefer, but it is better to start with liquids, then soup and crackers, and gradually work up to solid foods.   Please notify your doctor immediately if you have any unusual bleeding, trouble breathing, redness and pain at the surgery site, drainage, fever, or pain not relieved by medication.    Additional Instructions:        Please contact your physician with any problems or Same Day Surgery at [phone], Monday through Friday 6 am to 4 pm, or [place] [facility] number at [phone].

## 2022-01-06 NOTE — Transfer of Care (Signed)
Immediate Anesthesia Transfer of Care Note  Patient: Regina Carroll  Procedure(s) Performed: ENDOSCOPIC RIGHT CARPAL TUNNEL RELEASE WITH RELEASE OF RIGHT LONG TRIGGER FINGER. (Right: Wrist)  Patient Location: PACU  Anesthesia Type:General  Level of Consciousness: drowsy  Airway & Oxygen Therapy: Patient Spontanous Breathing and Patient connected to face mask oxygen  Post-op Assessment: Report given to RN and Post -op Vital signs reviewed and stable  Post vital signs: Reviewed and stable  Last Vitals:  Vitals Value Taken Time  BP 124/75 01/06/22 1210  Temp    Pulse 70 01/06/22 1211  Resp 11 01/06/22 1211  SpO2 99 % 01/06/22 1211  Vitals shown include unvalidated device data.  Last Pain:  Vitals:   01/06/22 0827  TempSrc: Temporal  PainSc: 0-No pain         Complications: No notable events documented.

## 2022-01-06 NOTE — H&P (Signed)
History of Present Illness:  Regina Carroll is a 61 y.o. female who presents today for her surgical history and physical. The patient is scheduled for a right endoscopic carpal tunnel release procedure in addition to release of right middle trigger finger with Dr. Roland Rack on 01/06/2022. The patient continues report a burning and tingling sensation especially involving the first 3 digits of her right hand. She also continues report a catching and locking feeling in the right middle finger. Pain score today is a 7 out of 10. She denies any recent trauma or injury affecting the right hand or fingers. The patient did undergo a nerve conduction study in the past which demonstrate evidence of mild-moderate bilateral upper extremity carpal tunnel syndrome. The patient denies any personal history of heart attack or stroke. She does have a history of COPD, she does use Combivent respimat in addition to SunGard for COPD. The patient also has a rescue inhaler prescribed for her. No history of DVT for the patient. She does continue to report increased pain at night when attempting to sleep as well.  Current Outpatient Medications: ADDERALL XR 20 mg XR capsule Take 1 capsule (20 mg total) by mouth every morning 90 capsule 0  albuterol 90 mcg/actuation inhaler Inhale 2 inhalations into the lungs every 4 (four) hours as needed for Wheezing or Shortness of Breath (chest tightness) 18 g 1  ascorbic acid (VITAMIN C ORAL) Take 1 tablet by mouth once daily  BIOTIN ORAL Take 1 tablet by mouth once daily  budesonide-glycopyrrolate-formoterol (BREZTRI AEROSPHERE) 160-9-4.8 mcg/actuation inhaler Inhale 2 inhalations into the lungs 2 (two) times daily 10.7 g 11  citalopram (CELEXA) 20 MG tablet TAKE 1 TABLET BY MOUTH ONCE DAILY 90 tablet 1  colchicine (COLCRYS) 0.6 mg tablet Take 1 tablet (0.6 mg total) by mouth once daily as needed Take 2 tabs at start of gout flare, then 1 tab an 1 hour later, then 1 daily. 10 tablet 3   CYANOCOBALAMIN, VITAMIN B-12, ORAL Take 1 tablet by mouth once daily  hydroCHLOROthiazide (HYDRODIURIL) 25 MG tablet TAKE 1 TABLET BY MOUTH ONCE DAILY . APPOINTMENT REQUIRED FOR FUTURE REFILLS 90 tablet 0  hydrOXYzine HCL (ATARAX) 10 MG tablet Take by mouth as needed  ipratropium-albuteroL (COMBIVENT RESPIMAT) 20-100 mcg/actuation inhaler Inhale 2 inhalations into the lungs 4 (four) times daily (Patient taking differently: Inhale 2 inhalations into the lungs 4 (four) times daily as needed) 4 g 0  magnesium oxide (MAG-OX) 400 mg (241.3 mg magnesium) tablet Take 400 mg by mouth once daily  meloxicam (MOBIC) 15 MG tablet Take 15 mg by mouth once as needed  methocarbamoL (ROBAXIN) 500 MG tablet TAKE 1 TABLET BY MOUTH 4 TIMES DAILY FOR 14 DAYS  omeprazole (PRILOSEC OTC) 20 MG tablet Take 20 mg by mouth once daily.  potassium gluconate 595 mg (99 mg) TbER Take 1 tablet by mouth once daily  QUEtiapine (SEROQUEL) 25 MG tablet Take 1 tablet (25 mg total) by mouth at bedtime for 30 days 30 tablet 2  traMADoL (ULTRAM) 50 mg tablet Take 1 tablet (50 mg total) by mouth every 6 (six) hours as needed for Pain for up to 30 doses 30 tablet 0  VITAMIN D3 ORAL Take 1 capsule by mouth once daily   Allergies: Codeine Itching   Past Medical History:  Abnormal cytology 2017  w/ HPV. s/p treatment of unknown type. Tx through [facility] at [facility]. Recommend yearly repeat paps.  Adjustment reaction with anxiety and depression  and chronic insomnia  Aortic atherosclerosis (CMS-HCC) 11/2020  Incidentally noted on CT chest: Mild aortic atherosclerosis. Minimal coronary artery calcifications.  COPD (chronic obstructive pulmonary disease) (CMS-HCC)  COVID-19 07/04/2020  Gonorrhea  Gout  Hemorrhoids  HPV (human papilloma virus) infection 2017  Tx through [facility] at [facility].  Hyperlipidemia  w/ triglyceride predominance.  Hyperplastic colon polyp 09/28/2016  Hypertension  Migraines  PID (pelvic inflammatory  disease)  Pulmonary nodule 11/2020  Approximately 3 mm which requires additional evaluation via surveillance CT scan in 12 months per recommendations via radiologist report.  Recovering alcoholic (CMS-HCC) 2831  Quit drinking in Fall 2018 (had been up to more than a pint of ETOH nightly). Attending [place] meetings regularly.  Trichomoniasis  Tubular adenoma of colon 09/28/2016   Past Surgical History:  COLONOSCOPY 09/28/2016  Multiple Tubular adenoma of colon/Hyperplastic colon polyp/Repeat 2 to 3 yrs/MUS  EGD 09/28/2016  Duodenal hyperplastic polyp/No Repeat/MUS  COLONOSCOPY 11/21/2019  Tubular adenoma of the colon/PHx CP/Repeat 34yr/[facility]  BREAST EXCISIONAL BIOPSY Left  Benign tumor  CESAREAN SECTION  TUBAL LIGATION   Family History:  Heart failure Mother  Alcohol abuse Mother  Other Mother  Colitis & rectal bleeding.  High blood pressure (Hypertension) Mother  Migraines Mother  Osteoporosis (Thinning of bones) Mother  Stroke Mother  High blood pressure (Hypertension) Father  Heart disease Father  Dementia Father  Other Father  Spinal stenosis[facility] Migraines Sister  Alcohol abuse Brother  Drug abuse Brother  Migraines Daughter  Migraines Son  Substance Abuse Son  Alcohol abuse Maternal Uncle  Breast cancer Maternal Grandmother  Post-menopausal  Diabetes type II Maternal Grandmother   Social History:   Socioeconomic History:  Marital status: Married  Spouse name: EPercell Miller Number of children: 2  Occupational History  Occupation: FAmbulance person Comment: [employer]  Tobacco Use  Smoking status: Every Day  Packs/day: 1.00  Years: 25.00  Pack years: 25.00  Types: Cigarettes  Smokeless tobacco: Never  Tobacco comments:  Quit for 5 yrs in the past  Vaping Use  Vaping Use: Never used  Substance and Sexual Activity  Alcohol use: Not Currently  Comment: Previous ETOH addiction.  Drug use: No  Sexual activity: Not Currently  Partners: Male   Birth control/protection: None  Social History Narrative  Has adult son & daughter.   Divorced from first husband. Infidelity issues that lead to pt contracting several STD's.  Remarried to EParker Hannifin also our pt. They split up in 2018.   Review of Systems:  A comprehensive 14 point ROS was performed, reviewed, and the pertinent orthopaedic findings are documented in the HPI.  Physical Exam: There were no vitals filed for this visit.  General/Constitutional: The patient appears to be well-nourished, well-developed, and in no acute distress. Neuro/Psych: Normal mood and affect, oriented to person, place and time. Eyes: Non-icteric. Pupils are equal, round, and reactive to light, and exhibit synchronous movement. ENT: Unremarkable. Lymphatic: No palpable adenopathy. Respiratory: Mild inspiratory wheeze noted, Lungs clear to auscultation, Normal chest excursion, and Non-labored breathing Cardiovascular: Regular rate and rhythm. No murmurs. and No edema, swelling or tenderness, except as noted in detailed exam. Integumentary: No impressive skin lesions present, except as noted in detailed exam. Musculoskeletal: Unremarkable, except as noted in detailed exam.  Right wrist/hand exam: Skin inspection of the right hand is unremarkable. No swelling, erythema, ecchymosis, abrasions, or other skin abnormalities are identified. She has no tenderness to palpation over the dorsal or palmar aspects of her wrist or hand. She  exhibits full active and passive range of motion of the wrist without any pain or catching. She is able to actively flex and extend all digits fully with out any pain, although there is a reproducible catching of her long finger, consistent with a right long trigger finger. She has a mildly tender fullness over the long metacarpal head which appears to move with her flexor tendon, also consistent with a right long trigger finger. She is neurovascularly intact to all digits. She has  an equivocally positive Phalen's test, but a negative Tinel's over the carpal tunnel.  Assessment: 1. Carpal tunnel syndrome, right.  2. Trigger middle finger of right hand.   Plan: 1. Treatment options were discussed today with the patient. 2. The patient is scheduled to undergo a right endoscopic carpal tunnel release procedure in addition to a right middle finger trigger finger release as well with Dr. Roland Rack on 01/06/2022. 3. The patient was instructed on the risk and benefits of surgery at today's appointment and wishes to proceed at this time. 4. This document will serve as a surgical history and physical for the patient. 5. The patient will follow-up per standard postop protocol.  The procedure was discussed with the patient, as were the potential risks (including bleeding, infection, nerve and/or blood vessel injury, persistent or recurrent pain/paresthesias/catching, weakness of grip, need for further surgery, blood clots, strokes, heart attacks and/or arhythmias, pneumonia, etc.) and benefits.   H&P reviewed and patient re-examined. No changes.

## 2022-01-06 NOTE — Anesthesia Postprocedure Evaluation (Signed)
Anesthesia Post Note  Patient: Regina Carroll  Procedure(s) Performed: ENDOSCOPIC RIGHT CARPAL TUNNEL RELEASE WITH RELEASE OF RIGHT LONG TRIGGER FINGER. (Right: Wrist)  Patient location during evaluation: PACU Anesthesia Type: General Level of consciousness: awake and alert Pain management: pain level controlled Vital Signs Assessment: post-procedure vital signs reviewed and stable Respiratory status: spontaneous breathing, nonlabored ventilation and respiratory function stable Cardiovascular status: blood pressure returned to baseline and stable Postop Assessment: no apparent nausea or vomiting Anesthetic complications: no   No notable events documented.   Last Vitals:  Vitals:   01/06/22 1320 01/06/22 1327  BP:  (!) 146/68  Pulse: 64 63  Resp: 16 18  Temp:  (!) 36.2 C  SpO2: 98% 99%    Last Pain:  Vitals:   01/06/22 1327  TempSrc: Temporal  PainSc: 0-No pain                 [place] Ouch

## 2022-01-06 NOTE — Op Note (Signed)
01/06/2022  12:10 PM  Patient:   Regina Carroll  Pre-Op Diagnosis:   1.  Right carpal tunnel syndrome.  2.  Right long trigger finger  Post-Op Diagnosis:   Same  Procedure:   1.  Endoscopic right carpal tunnel release.  2.  Release of right long trigger finger.  Surgeon:   Pascal Lux, MD  Assistant:   Almon Register, PA-S  Anesthesia:   General LMA  Findings:   As above.  Complications:   None  EBL:   2 cc  Fluids:   600 cc crystalloid  TT:   30 minutes at 250 mmHg  Drains:   None  Closure:   4-0 Prolene interrupted sutures  Brief Clinical Note:   The patient is a 60 year old female with a history of progressively worsening pain and paresthesias to her right hand. Her symptoms have progressed despite medications, activity modification, etc. Her history and examination are consistent with carpal tunnel syndrome. The patient presents at this time for an endoscopic right carpal tunnel release.  The patient also notes painful recurrent catching of her right long finger. These symptoms also have persisted despite medications, activity modification, etc. Her history and examination are consistent with a right long trigger finger. She also presents for release of her right long trigger finger.  Procedure:   The patient was brought into the operating room and lain in the supine position. After adequate general laryngeal mask anesthesia was obtained, the right hand and upper extremity were prepped with ChloraPrep solution before being draped sterilely. Preoperative antibiotics were administered. A timeout was performed to verify the appropriate surgical site before the limb was exsanguinated with an Esmarch and the tourniquet inflated to 250 mmHg.   An approximately 1.5-2 cm incision was made over the volar wrist flexion crease, centered over the palmaris longus tendon. The incision was carried down through the subcutaneous tissues with care taken to identify and protect any  neurovascular structures. The distal forearm fascia was penetrated just proximal to the transverse carpal ligament. The soft tissues were released off the superficial and deep surfaces of the distal forearm fascia and this was released proximally for 3-4 cm under direct visualization.  Attention was directed distally. The Soil scientist was passed beneath the transverse carpal ligament along the ulnar aspect of the carpal tunnel and used to release any adhesions as well as to remove any adherent synovial tissue before first the smaller then the larger of the two dilators were passed beneath the transverse carpal ligament along the ulnar margin of the carpal tunnel. The slotted cannula was introduced and the endoscope was placed into the slotted cannula and the undersurface of the transverse carpal ligament visualized. The distal margin of the transverse carpal ligament was marked by placing a 25-gauge needle percutaneously at Hawley cardinal point so that it entered the distal portion of the slotted cannula. Under endoscopic visualization, the transverse carpal ligament was released from proximal to distal using the end-cutting blade. A second pass was performed to ensure complete release of the ligament. The adequacy of release was verified both endoscopically and by palpation using the freer elevator.  Next, the right long trigger finger was addressed. An approximately 1.5-2.0 cm incision was made over the volar aspect of the right long finger at the level of the metacarpal head centered over the flexor sheath. The incision was carried down through the subcutaneous tissues with care taken to identify and protect the digital neurovascular structures. The flexor sheath was entered  just proximal to the A1 pulley. The sheath was released proximally for several centimeters under direct visualization. Distally, a clamp was placed beneath the A1 pulley and used to release any adhesions. The clamp was repositioned  so that one jaw was superficial to and the other jaw deep to the A1 pulley. The A1 pulley was incised on either side of the clamp to remove a 2 mm strip of tissue. Metzenbaum scissors were used to ensure complete release of the A1 pulley more distally. The underlying tendons were carefully inspected and found to be intact.  Each wound was irrigated thoroughly with sterile saline solution before being closed using 4-0 Prolene interrupted sutures. A total of 10 cc of 0.5% plain Sensorcaine was injected in and around the incisions before a sterile bulky dressing was applied to the wound. The patient was placed into a volar wrist splint before being awakened, extubated, and returned to the recovery room in satisfactory condition after tolerating the procedure well.

## 2022-01-06 NOTE — Anesthesia Preprocedure Evaluation (Addendum)
Anesthesia Evaluation  Patient identified by MRN, date of birth, ID band Patient awake    Reviewed: Allergy & Precautions, H&P , NPO status , Patient's Chart, lab work & pertinent test results  History of Anesthesia Complications Negative for: history of anesthetic complications  Airway Mallampati: II  TM Distance: >3 FB Neck ROM: full    Dental  (+) Partial Lower   Pulmonary neg shortness of breath, COPD,  COPD inhaler, Current SmokerPatient did not abstain from smoking.,    Pulmonary exam normal breath sounds clear to auscultation       Cardiovascular Exercise Tolerance: Good hypertension, (-) angina(-) Past MI and (-) DOE Normal cardiovascular exam Rhythm:regular Rate:Normal     Neuro/Psych PSYCHIATRIC DISORDERS Anxiety Depression negative neurological ROS     GI/Hepatic Neg liver ROS, GERD  Medicated and Controlled,  Endo/Other  negative endocrine ROS  Renal/GU negative Renal ROS  negative genitourinary   Musculoskeletal   Abdominal Normal abdominal exam  (+)   Peds  Hematology negative hematology ROS (+)   Anesthesia Other Findings Past Medical History: No date: Abnormal cervical cytology No date: Adjustment reaction with anxiety and depression No date: Anxiety No date: COPD (chronic obstructive pulmonary disease) (* No date: Depression No date: Diarrhea No date: Gonorrhea No date: Gout No date: History of acute PID No date: Hx of trichomoniasis No date: Hypertension  Past Surgical History: 02/17/2009: BREAST EXCISIONAL BIOPSY Left     Comment: neg No date: breast mass removed unk: CESAREAN SECTION Bilateral No date: TUBAL LIGATION  BMI    Body Mass Index:  24.56 kg/m      Reproductive/Obstetrics negative OB ROS                           Anesthesia Physical  Anesthesia Plan  ASA: 3  Anesthesia Plan: General   Post-op Pain Management: Regional block* and Tylenol  PO (pre-op)*   Induction: Intravenous  PONV Risk Score and Plan: 2 and Ondansetron and Dexamethasone  Airway Management Planned: LMA  Additional Equipment: None  Intra-op Plan:   Post-operative Plan: Extubation in OR  Informed Consent: I have reviewed the patients History and Physical, chart, labs and discussed the procedure including the risks, benefits and alternatives for the proposed anesthesia with the patient or authorized representative who has indicated his/her understanding and acceptance.     Dental Advisory Given  Plan Discussed with: Anesthesiologist, CRNA and Surgeon  Anesthesia Plan Comments: (Discussed risks of anesthesia with patient, including possibility of difficulty with spontaneous ventilation under anesthesia necessitating airway intervention, PONV, and rare risks such as cardiac or respiratory or neurological events. Patient understands.)       Anesthesia Quick Evaluation

## 2022-01-06 NOTE — Anesthesia Procedure Notes (Signed)
Procedure Name: LMA Insertion Date/Time: 01/06/2022 11:06 AM  Performed by: Cammie Sickle, CRNAPre-anesthesia Checklist: Patient identified, Patient being monitored, Timeout performed, Emergency Drugs available and Suction available Patient Re-evaluated:Patient Re-evaluated prior to induction Oxygen Delivery Method: Circle system utilized Preoxygenation: Pre-oxygenation with 100% oxygen Induction Type: IV induction Ventilation: Mask ventilation without difficulty LMA: LMA inserted LMA Size: 3.0 Tube type: Oral Number of attempts: 1 Placement Confirmation: positive ETCO2 and breath sounds checked- equal and bilateral Tube secured with: Tape Dental Injury: Teeth and Oropharynx as per pre-operative assessment

## 2022-01-06 NOTE — TOC Initial Note (Signed)
Transition of Care [facility]) - Initial/Assessment Note    Patient Details  Name: TYERRA LORETTO MRN: [id number] Date of Birth: 1961-03-25  Transition of Care [facility]) CM/SW Contact:    Conception Oms, RN Phone Number: 01/06/2022, 11:55 AM  Clinical Narrative:                   Transition of Care [facility]) Screening Note   Patient Details  Name: CELESTE TAVENNER Date of Birth: 1961-03-05   Transition of Care [facility]) CM/SW Contact:    Conception Oms, RN Phone Number: 01/06/2022, 11:55 AM    Transition of Care Department [facility]) has reviewed patient and no TOC needs have been identified at this time. We will continue to monitor patient advancement through interdisciplinary progression rounds. If new patient transition needs arise, please place a TOC consult.         Patient Goals and CMS Choice        Expected Discharge Plan and Services                                                Prior Living Arrangements/Services                       Activities of Daily Living      Permission Sought/Granted                  Emotional Assessment              Admission diagnosis:  Carpal tunnel syndrome, right G56.01 Trigger middle finger of right hand M65.331 Patient Active Problem List   Diagnosis Date Noted   CIN I (cervical intraepithelial neoplasia I) 08/16/2017   HPV in female 02/01/2017   PCP:  Idelle Crouch, MD Pharmacy:   [place], [place] [facility] [place] [address] [place] [address] Phone: [phone] Fax: [place] ([place]), [place]) [place] [address] Phone: [phone] Fax: [phone]     Social Determinants of Health (SDOH) Interventions    Readmission Risk Interventions     No data to display

## 2022-01-07 ENCOUNTER — Encounter: Payer: Self-pay | Admitting: Surgery

## 2022-07-01 DIAGNOSIS — E0590 Thyrotoxicosis, unspecified without thyrotoxic crisis or storm: Secondary | ICD-10-CM | POA: Insufficient documentation

## 2022-07-14 ENCOUNTER — Encounter: Payer: Self-pay | Admitting: Podiatry

## 2022-07-14 ENCOUNTER — Ambulatory Visit (INDEPENDENT_AMBULATORY_CARE_PROVIDER_SITE_OTHER): Payer: BC Managed Care – PPO | Admitting: Podiatry

## 2022-07-14 VITALS — BP 126/71 | HR 59

## 2022-07-14 DIAGNOSIS — J449 Chronic obstructive pulmonary disease, unspecified: Secondary | ICD-10-CM | POA: Insufficient documentation

## 2022-07-14 DIAGNOSIS — M109 Gout, unspecified: Secondary | ICD-10-CM | POA: Insufficient documentation

## 2022-07-14 DIAGNOSIS — G43909 Migraine, unspecified, not intractable, without status migrainosus: Secondary | ICD-10-CM | POA: Insufficient documentation

## 2022-07-14 DIAGNOSIS — D2372 Other benign neoplasm of skin of left lower limb, including hip: Secondary | ICD-10-CM | POA: Diagnosis not present

## 2022-07-14 DIAGNOSIS — F4323 Adjustment disorder with mixed anxiety and depressed mood: Secondary | ICD-10-CM | POA: Insufficient documentation

## 2022-07-14 DIAGNOSIS — E785 Hyperlipidemia, unspecified: Secondary | ICD-10-CM | POA: Insufficient documentation

## 2022-07-14 DIAGNOSIS — I10 Essential (primary) hypertension: Secondary | ICD-10-CM | POA: Insufficient documentation

## 2022-07-14 NOTE — Progress Notes (Signed)
Subjective:  Patient ID: Regina Carroll, female    DOB: 01/24/1961,  MRN: [id number] HPI Chief Complaint  Patient presents with   Toe Pain    2nd toe left - lump medially x few months, Dr. Doy Hutching said it was an ingrown, very tender with shoes and other toe rubbing, very little feeling in feet, could be due to pinch nerve in back and neck   New Patient (Initial Visit)    62 y.o. female presents with the above complaint.   ROS: Denies fever chills nausea vomit muscle aches pains calf pain back pain chest pain shortness of breath.     Past Medical History:  Diagnosis Date   Abnormal cervical cytology    Adjustment reaction with anxiety and depression    Anxiety    COPD (chronic obstructive pulmonary disease) (HCC)    Depression    Diarrhea    Family history of adverse reaction to anesthesia    mother difficulty waking up   GERD (gastroesophageal reflux disease)    Gonorrhea    Gout    History of acute PID    Hx of trichomoniasis    Hypertension    Plantar fasciitis    Past Surgical History:  Procedure Laterality Date   BREAST EXCISIONAL BIOPSY Left 02/17/2009   neg   breast mass removed     CARPAL TUNNEL RELEASE Right 01/06/2022   Procedure: ENDOSCOPIC RIGHT CARPAL TUNNEL RELEASE WITH RELEASE OF RIGHT LONG TRIGGER FINGER.;  Surgeon: Corky Mull, MD;  Location: [facility] ORS;  Service: Orthopedics;  Laterality: Right;   CESAREAN SECTION Bilateral unk   COLONOSCOPY WITH PROPOFOL N/A 09/28/2016   Procedure: COLONOSCOPY WITH PROPOFOL;  Surgeon: Lollie Sails, MD;  Location: [facility] ENDOSCOPY;  Service: Endoscopy;  Laterality: N/A;   COLONOSCOPY WITH PROPOFOL N/A 11/21/2019   Procedure: COLONOSCOPY WITH PROPOFOL;  Surgeon: Robert Bellow, MD;  Location: [facility] ENDOSCOPY;  Service: Endoscopy;  Laterality: N/A;   ESOPHAGOGASTRODUODENOSCOPY (EGD) WITH PROPOFOL N/A 09/28/2016   Procedure: ESOPHAGOGASTRODUODENOSCOPY (EGD) WITH PROPOFOL;  Surgeon: Lollie Sails, MD;  Location: [facility]  ENDOSCOPY;  Service: Endoscopy;  Laterality: N/A;   TUBAL LIGATION      Current Outpatient Medications:    hydrOXYzine (ATARAX) 10 MG tablet, Take 1 tablet by mouth as needed., Disp: , Rfl:    methocarbamol (ROBAXIN) 500 MG tablet, Take 1 tablet by mouth 3 (three) times daily., Disp: , Rfl:    albuterol (PROVENTIL HFA;VENTOLIN HFA) 108 (90 Base) MCG/ACT inhaler, Inhale 2 puffs into the lungs every 6 (six) hours as needed for wheezing or shortness of breath., Disp: 1 Inhaler, Rfl: 0   albuterol (VENTOLIN HFA) 108 (90 Base) MCG/ACT inhaler, Inhale 2 puffs into the lungs every 6 (six) hours as needed., Disp: 6.7 g, Rfl: 0   allopurinol (ZYLOPRIM) 100 MG tablet, Take 100 mg by mouth daily., Disp: , Rfl: 1   amphetamine-dextroamphetamine (ADDERALL) 20 MG tablet, Take 20 mg by mouth daily., Disp: , Rfl:    Budeson-Glycopyrrol-Formoterol (BREZTRI AEROSPHERE) 160-9-4.8 MCG/ACT AERO, Inhale 2 puffs into the lungs 2 (two) times daily., Disp: , Rfl:    citalopram (CELEXA) 20 MG tablet, Take 20 mg by mouth daily., Disp: , Rfl:    colchicine 0.6 MG tablet, Take 0.6 mg by mouth daily as needed., Disp: , Rfl:    hydrochlorothiazide (HYDRODIURIL) 25 MG tablet, Take 25 mg by mouth daily., Disp: , Rfl:    omeprazole (PRILOSEC OTC) 20 MG tablet, Take 20 mg by mouth daily. ,  Disp: , Rfl:    Potassium Gluconate 595 MG TBCR, Take 595 mg by mouth daily. , Disp: , Rfl:    SUMAtriptan (IMITREX) 50 MG tablet, Take 50 mg by mouth every 2 (two) hours as needed for migraine. May repeat in 2 hours if headache persists or recurs., Disp: , Rfl:    vitamin B-12 (CYANOCOBALAMIN) 1000 MCG tablet, Take 1,000 mcg by mouth daily., Disp: , Rfl:   Allergies  Allergen Reactions   Codeine    Penicillins Rash    Has patient had a PCN reaction causing immediate rash, facial/tongue/throat swelling, SOB or lightheadedness with hypotension: Unknown Has patient had a PCN reaction causing severe rash involving mucus membranes or skin  necrosis: Unknown Has patient had a PCN reaction that required hospitalization: Unknown Has patient had a PCN reaction occurring within the last 10 years: Unknown If all of the above answers are "NO", then may proceed with Cephalosporin use.    Review of Systems Objective:   Vitals:   07/14/22 0901  BP: 126/71  Pulse: (!) 59    General: Well developed, nourished, in no acute distress, alert and oriented x3   Dermatological: Skin is warm, dry and supple bilateral. Nails x 10 are well maintained; remaining integument appears unremarkable at this time. There are no open sores, no preulcerative lesions, no rash or signs of infection present.  Porokeratotic lesion medial aspect second toe left foot at the DIPJ.  This is secondary to the juxtaposition of the interphalangeal joint of the hallux and that second toe DIPJ.  Vascular: Dorsalis Pedis artery and Posterior Tibial artery pedal pulses are 2/4 bilateral with immedate capillary fill time. Pedal hair growth present. No varicosities and no lower extremity edema present bilateral.   Neruologic: Grossly intact via light touch bilateral. Vibratory intact via tuning fork bilateral. Protective threshold with Semmes Wienstein monofilament intact to all pedal sites bilateral. Patellar and Achilles deep tendon reflexes 2+ bilateral. No Babinski or clonus noted bilateral.   Musculoskeletal: No gross boney pedal deformities bilateral. No pain, crepitus, or limitation noted with foot and ankle range of motion bilateral. Muscular strength 5/5 in all groups tested bilateral.  Gait: Unassisted, Nonantalgic.    Radiographs:  None taken  Assessment & Plan:   Assessment: Benign skin lesion osteoarthritis DIPJ second digit  Plan: Debrided benign skin lesion today.  Placed silicone padding discussed possible need for surgery will follow-up with her as needed     Lalana Wachter T. [place], [place]

## 2022-08-25 ENCOUNTER — Other Ambulatory Visit: Payer: Self-pay | Admitting: Internal Medicine

## 2022-08-25 DIAGNOSIS — R2689 Other abnormalities of gait and mobility: Secondary | ICD-10-CM

## 2022-08-25 DIAGNOSIS — H539 Unspecified visual disturbance: Secondary | ICD-10-CM

## 2022-08-25 DIAGNOSIS — G44209 Tension-type headache, unspecified, not intractable: Secondary | ICD-10-CM

## 2022-09-07 ENCOUNTER — Ambulatory Visit
Admission: RE | Admit: 2022-09-07 | Discharge: 2022-09-07 | Disposition: A | Discharge status: Home / Self Care | Payer: BC Managed Care – PPO | Source: Ambulatory Visit | Attending: Internal Medicine | Admitting: Internal Medicine

## 2022-09-07 DIAGNOSIS — H539 Unspecified visual disturbance: Secondary | ICD-10-CM | POA: Diagnosis present

## 2022-09-07 DIAGNOSIS — R2689 Other abnormalities of gait and mobility: Secondary | ICD-10-CM | POA: Diagnosis present

## 2022-09-07 DIAGNOSIS — G44209 Tension-type headache, unspecified, not intractable: Secondary | ICD-10-CM | POA: Diagnosis present

## 2022-10-28 ENCOUNTER — Other Ambulatory Visit: Payer: Self-pay | Admitting: Internal Medicine

## 2022-10-28 DIAGNOSIS — Z1231 Encounter for screening mammogram for malignant neoplasm of breast: Secondary | ICD-10-CM

## 2022-11-02 ENCOUNTER — Ambulatory Visit
Admission: RE | Admit: 2022-11-02 | Discharge: 2022-11-02 | Disposition: A | Discharge status: Home / Self Care | Payer: BC Managed Care – PPO | Source: Ambulatory Visit | Attending: Internal Medicine | Admitting: Internal Medicine

## 2022-11-02 DIAGNOSIS — Z1231 Encounter for screening mammogram for malignant neoplasm of breast: Secondary | ICD-10-CM | POA: Diagnosis present

## 2023-01-26 ENCOUNTER — Other Ambulatory Visit: Payer: Self-pay | Admitting: Internal Medicine

## 2023-01-26 DIAGNOSIS — R911 Solitary pulmonary nodule: Secondary | ICD-10-CM

## 2023-01-26 DIAGNOSIS — Z0000 Encounter for general adult medical examination without abnormal findings: Secondary | ICD-10-CM

## 2023-02-08 ENCOUNTER — Ambulatory Visit
Admission: RE | Admit: 2023-02-08 | Discharge: 2023-02-08 | Disposition: A | Discharge status: Home / Self Care | Payer: BC Managed Care – PPO | Source: Ambulatory Visit | Attending: Internal Medicine | Admitting: Internal Medicine

## 2023-02-08 DIAGNOSIS — R911 Solitary pulmonary nodule: Secondary | ICD-10-CM | POA: Diagnosis present

## 2023-02-08 DIAGNOSIS — Z0000 Encounter for general adult medical examination without abnormal findings: Secondary | ICD-10-CM | POA: Insufficient documentation

## 2023-02-13 ENCOUNTER — Emergency Department
Admission: EM | Admit: 2023-02-13 | Discharge: 2023-02-13 | Disposition: A | Discharge status: Home / Self Care | Payer: BC Managed Care – PPO | Attending: Emergency Medicine | Admitting: Emergency Medicine

## 2023-02-13 ENCOUNTER — Other Ambulatory Visit: Payer: Self-pay

## 2023-02-13 ENCOUNTER — Emergency Department: Payer: BC Managed Care – PPO

## 2023-02-13 DIAGNOSIS — S61431A Puncture wound without foreign body of right hand, initial encounter: Secondary | ICD-10-CM | POA: Insufficient documentation

## 2023-02-13 DIAGNOSIS — W268XXA Contact with other sharp object(s), not elsewhere classified, initial encounter: Secondary | ICD-10-CM | POA: Insufficient documentation

## 2023-02-13 DIAGNOSIS — Z79899 Other long term (current) drug therapy: Secondary | ICD-10-CM | POA: Insufficient documentation

## 2023-02-13 DIAGNOSIS — Z23 Encounter for immunization: Secondary | ICD-10-CM | POA: Insufficient documentation

## 2023-02-13 MED ORDER — LIDOCAINE HCL (PF) 1 % IJ SOLN
5.0000 mL | Freq: Once | INTRAMUSCULAR | Status: AC
  Administered 2023-02-13: 5 mL
  Filled 2023-02-13: qty 5

## 2023-02-13 MED ORDER — TETANUS-DIPHTH-ACELL PERTUSSIS 5-2.5-18.5 LF-MCG/0.5 IM SUSY
0.5000 mL | PREFILLED_SYRINGE | Freq: Once | INTRAMUSCULAR | Status: AC
  Administered 2023-02-13: 0.5 mL via INTRAMUSCULAR
  Filled 2023-02-13: qty 0.5

## 2023-02-13 MED ORDER — TRAMADOL HCL 50 MG PO TABS: 50.0000 mg | ORAL_TABLET | Freq: Four times a day (QID) | ORAL | 0 refills | Status: AC | PRN

## 2023-02-13 MED ORDER — AMOXICILLIN-POT CLAVULANATE 875-125 MG PO TABS
1.0000 | ORAL_TABLET | Freq: Once | ORAL | Status: AC
  Administered 2023-02-13: 1 via ORAL
  Filled 2023-02-13: qty 1

## 2023-02-13 MED ORDER — SULFAMETHOXAZOLE-TRIMETHOPRIM 800-160 MG PO TABS
1.0000 | ORAL_TABLET | Freq: Once | ORAL | Status: AC
  Administered 2023-02-13: 1 via ORAL
  Filled 2023-02-13: qty 1

## 2023-02-13 MED ORDER — SULFAMETHOXAZOLE-TRIMETHOPRIM 800-160 MG PO TABS
1.0000 | ORAL_TABLET | Freq: Two times a day (BID) | ORAL | 0 refills | Status: AC
Start: 2023-02-13 — End: 2023-02-23

## 2023-02-13 MED ORDER — AMOXICILLIN-POT CLAVULANATE 875-125 MG PO TABS: 1.0000 | ORAL_TABLET | Freq: Two times a day (BID) | ORAL | 0 refills | Status: AC

## 2023-02-13 NOTE — ED Triage Notes (Signed)
Patient states she was cutting down a tree yesterday and a branch cut the top of her right hand.

## 2023-02-13 NOTE — Discharge Instructions (Addendum)
Please soak wound in half peroxide and half tap water 5 minutes 3 times daily.  Take antibiotics as prescribed.  Keep wound covered during the day with a bandage.  Return to the ER for any increasing pain swelling warmth redness or any urgent changes in health.

## 2023-02-13 NOTE — ED Notes (Signed)
Pt A&O x4, no obvious distress noted, respirations regular/unlabored. Pt verbalizes understanding of discharge instructions. Pt able to ambulate from ED independently.   

## 2023-02-13 NOTE — ED Provider Notes (Signed)
[place] EMERGENCY DEPARTMENT AT [facility] Provider Note   CSN: [id number] Arrival date & time: 02/13/23  1613     History  Chief Complaint  Patient presents with   Hand Pain    Regina Carroll is a 62 y.o. female.  Presents to the emergency department for evaluation of puncture wound to the right hand.  She was cleaning some shrubs and a sharp branch from the shrub penetrated her tissue on the dorsum of her right hand between the third and fourth digit at the MCP region.  She removed the single branch, she does not believe there was any residual foreign body left in the tissue.  Tetanus status unknown.  She comes in today because of having some soreness to the area with pain and slight swelling.  She denies any fevers.  HPI     Home Medications Prior to Admission medications   Medication Sig Start Date End Date Taking? Authorizing Provider  amoxicillin-clavulanate (AUGMENTIN) 875-125 MG tablet Take 1 tablet by mouth 2 (two) times daily for 10 days. 02/13/23 02/23/23 Yes Evon Slack, PA-C  sulfamethoxazole-trimethoprim (BACTRIM DS) 800-160 MG tablet Take 1 tablet by mouth 2 (two) times daily for 10 days. 02/13/23 02/23/23 Yes Evon Slack, PA-C  traMADol (ULTRAM) 50 MG tablet Take 1 tablet (50 mg total) by mouth every 6 (six) hours as needed. 02/13/23  Yes Evon Slack, PA-C  albuterol (PROVENTIL HFA;VENTOLIN HFA) 108 (90 Base) MCG/ACT inhaler Inhale 2 puffs into the lungs every 6 (six) hours as needed for wheezing or shortness of breath. 06/20/15   Menshew, Charlesetta Ivory, PA-C  albuterol (VENTOLIN HFA) 108 (90 Base) MCG/ACT inhaler Inhale 2 puffs into the lungs every 6 (six) hours as needed. 09/27/19   Menshew, Charlesetta Ivory, PA-C  allopurinol (ZYLOPRIM) 100 MG tablet Take 100 mg by mouth daily. 05/10/18   [provider]  amphetamine-dextroamphetamine (ADDERALL) 20 MG tablet Take 20 mg by mouth daily.    [provider]   Budeson-Glycopyrrol-Formoterol (BREZTRI AEROSPHERE) 160-9-4.8 MCG/ACT AERO Inhale 2 puffs into the lungs 2 (two) times daily.    [provider]  citalopram (CELEXA) 20 MG tablet Take 20 mg by mouth daily. 11/29/17   [provider]  colchicine 0.6 MG tablet Take 0.6 mg by mouth daily as needed.    [provider]  hydrochlorothiazide (HYDRODIURIL) 25 MG tablet Take 25 mg by mouth daily.    [provider]  hydrOXYzine (ATARAX) 10 MG tablet Take 1 tablet by mouth as needed. 07/10/22   [provider]  methocarbamol (ROBAXIN) 500 MG tablet Take 1 tablet by mouth 3 (three) times daily. 06/18/22   [provider]  omeprazole (PRILOSEC OTC) 20 MG tablet Take 20 mg by mouth daily.     [provider]  Potassium Gluconate 595 MG TBCR Take 595 mg by mouth daily.     [provider]  SUMAtriptan (IMITREX) 50 MG tablet Take 50 mg by mouth every 2 (two) hours as needed for migraine. May repeat in 2 hours if headache persists or recurs.    [provider]  vitamin B-12 (CYANOCOBALAMIN) 1000 MCG tablet Take 1,000 mcg by mouth daily.    [provider]      Allergies    Codeine and Penicillins    Review of Systems   Review of Systems  Physical Exam Updated Vital Signs BP (!) 149/88   Pulse 64   Temp 97.8 F (36.6 C) (Oral)  Resp 16   Ht 5' (1.524 m)   Wt 46.7 kg   LMP 12/09/2000 (Approximate)   SpO2 99%   BMI 20.12 kg/m  Physical Exam Constitutional:      Appearance: She is well-developed.  HENT:     Head: Normocephalic and atraumatic.     Right Ear: External ear normal.     Left Ear: External ear normal.  Eyes:     Conjunctiva/sclera: Conjunctivae normal.  Cardiovascular:     Rate and Rhythm: Normal rate.     Pulses: Normal pulses.     Heart sounds: Normal heart sounds.  Pulmonary:     Effort: Pulmonary effort is normal. No respiratory distress.  Musculoskeletal:        General: Normal range  of motion.     Cervical back: Normal range of motion.     Comments: Right hand shows a 1.5 cm puncture wound between third and fourth digit on the dorsum of the hand at the MCP region.  She is able to maintain full active extension.  She has full flexion.  No visible or palpable foreign body.  No bleeding.  Wound is slightly erythematous with no drainage.  Skin:    General: Skin is warm.     Capillary Refill: Capillary refill takes less than 2 seconds.     Findings: No rash.  Neurological:     General: No focal deficit present.     Mental Status: She is alert and oriented to person, place, and time.     Cranial Nerves: No cranial nerve deficit.  Psychiatric:        Mood and Affect: Mood normal.        Behavior: Behavior normal.        Thought Content: Thought content normal.     ED Results / Procedures / Treatments   Labs (all labs ordered are listed, but only abnormal results are displayed) Labs Reviewed - No data to display  EKG None  Radiology DG Hand Complete Right  Result Date: 02/13/2023 CLINICAL DATA:  Status post trauma. EXAM: RIGHT HAND - COMPLETE 3+ VIEW COMPARISON:  December 09, 2007 FINDINGS: There is no evidence of fracture or dislocation. There is no evidence of arthropathy or other focal bone abnormality. Very mild dorsal soft tissue swelling is seen along the distal metacarpals. IMPRESSION: No acute osseous abnormality. Electronically Signed   By: Aram Candela M.D.   On: 02/13/2023 17:26    Procedures Procedures    Medications Ordered in ED Medications  Tdap (BOOSTRIX) injection 0.5 mL (0.5 mLs Intramuscular Given 02/13/23 1646)  lidocaine (PF) (XYLOCAINE) 1 % injection 5 mL (5 mLs Infiltration Given by Other 02/13/23 1646)  sulfamethoxazole-trimethoprim (BACTRIM DS) 800-160 MG per tablet 1 tablet (1 tablet Oral Given 02/13/23 1645)  amoxicillin-clavulanate (AUGMENTIN) 875-125 MG per tablet 1 tablet (1 tablet Oral Given 02/13/23 1726)    ED Course/ Medical  Decision Making/ A&P                                 Medical Decision Making Amount and/or Complexity of Data Reviewed Radiology: ordered.  Risk Prescription drug management.   62 year old female with puncture wound to the dorsum of the right hand with a stick.  No visible or palpable foreign body.  Extensor mechanism is intact.  X-ray showed no fracture or residual foreign body on x-ray.  Wound was cleansed, thoroughly irrigated with pulse lavage with Betadine  and saline.  Patient was placed on Bactrim and Augmentin to the wound and is given some pain medication.  She is educated on daily wound care and signs and symptoms return to the ER for.  Tetanus was updated today.  Final Clinical Impression(s) / ED Diagnoses Final diagnoses:  Puncture wound of right hand without foreign body, initial encounter    Rx / DC Orders ED Discharge Orders          Ordered    amoxicillin-clavulanate (AUGMENTIN) 875-125 MG tablet  2 times daily        02/13/23 1729    sulfamethoxazole-trimethoprim (BACTRIM DS) 800-160 MG tablet  2 times daily        02/13/23 1729    traMADol (ULTRAM) 50 MG tablet  Every 6 hours PRN        02/13/23 1729              Ronnette Juniper 02/13/23 1732    Jene Every, MD 02/13/23 1820

## 2023-02-23 ENCOUNTER — Other Ambulatory Visit: Payer: Self-pay

## 2023-02-23 DIAGNOSIS — R1030 Lower abdominal pain, unspecified: Secondary | ICD-10-CM | POA: Diagnosis not present

## 2023-02-23 DIAGNOSIS — J449 Chronic obstructive pulmonary disease, unspecified: Secondary | ICD-10-CM | POA: Diagnosis not present

## 2023-02-23 DIAGNOSIS — I10 Essential (primary) hypertension: Secondary | ICD-10-CM | POA: Diagnosis not present

## 2023-02-23 DIAGNOSIS — M545 Low back pain, unspecified: Secondary | ICD-10-CM | POA: Diagnosis not present

## 2023-02-23 DIAGNOSIS — D72829 Elevated white blood cell count, unspecified: Secondary | ICD-10-CM | POA: Insufficient documentation

## 2023-02-23 DIAGNOSIS — Z87891 Personal history of nicotine dependence: Secondary | ICD-10-CM | POA: Diagnosis not present

## 2023-02-23 DIAGNOSIS — R310 Gross hematuria: Secondary | ICD-10-CM | POA: Insufficient documentation

## 2023-02-23 LAB — URINALYSIS, ROUTINE W REFLEX MICROSCOPIC
Bacteria, UA: NONE SEEN
Bilirubin Urine: NEGATIVE
Glucose, UA: NEGATIVE mg/dL
Ketones, ur: 5 mg/dL — AB
Nitrite: NEGATIVE
Protein, ur: 100 mg/dL — AB
RBC / HPF: >50 RBC/hpf (ref 0–5)
Specific Gravity, Urine: 1.018 (ref 1.005–1.030)
pH: 5 (ref 5.0–8.0)

## 2023-02-23 LAB — COMPREHENSIVE METABOLIC PANEL
ALT: 18 U/L (ref 0–44)
AST: 20 U/L (ref 15–41)
Albumin: 4.5 g/dL (ref 3.5–5.0)
Alkaline Phosphatase: 77 U/L (ref 38–126)
Anion gap: 12 (ref 5–15)
BUN: 16 mg/dL (ref 8–23)
CO2: 23 mmol/L (ref 22–32)
Calcium: 9.3 mg/dL (ref 8.9–10.3)
Chloride: 102 mmol/L (ref 98–111)
Creatinine, Ser: 0.88 mg/dL (ref 0.44–1.00)
GFR, Estimated: >60 mL/min (ref >60)
Glucose, Bld: 88 mg/dL (ref 70–99)
Potassium: 4.4 mmol/L (ref 3.5–5.1)
Sodium: 137 mmol/L (ref 135–145)
Total Bilirubin: 0.8 mg/dL (ref 0.3–1.2)
Total Protein: 7.3 g/dL (ref 6.5–8.1)

## 2023-02-23 LAB — CBC
HCT: 38.3 % (ref 36.0–46.0)
Hemoglobin: 12.7 g/dL (ref 12.0–15.0)
MCH: 28.9 pg (ref 26.0–34.0)
MCHC: 33.2 g/dL (ref 30.0–36.0)
MCV: 87.2 fL (ref 80.0–100.0)
Platelets: 367 10*3/uL (ref 150–400)
RBC: 4.39 MIL/uL (ref 3.87–5.11)
RDW: 12.7 % (ref 11.5–15.5)
WBC: 12.1 10*3/uL — ABNORMAL HIGH (ref 4.0–10.5)
nRBC: 0 % (ref 0.0–0.2)

## 2023-02-23 LAB — LIPASE, BLOOD: Lipase: 37 U/L (ref 11–51)

## 2023-02-23 NOTE — ED Triage Notes (Signed)
Pt to ED via POV c/o blood in urine and lower abd pain. Pt says ;ower back pain and lower abd pain started today, and she also starting urinating blood. Pt has been on antibiotics for 10 days for a puncture wound. Pt denies CP, SOB, fevers, dizziness.

## 2023-02-24 ENCOUNTER — Emergency Department
Admission: EM | Admit: 2023-02-24 | Discharge: 2023-02-24 | Disposition: A | Discharge status: Home / Self Care | Payer: BC Managed Care – PPO | Attending: Emergency Medicine | Admitting: Emergency Medicine

## 2023-02-24 ENCOUNTER — Emergency Department: Payer: BC Managed Care – PPO

## 2023-02-24 DIAGNOSIS — R310 Gross hematuria: Secondary | ICD-10-CM

## 2023-02-24 MED ORDER — ACETAMINOPHEN 500 MG PO TABS
1000.0000 mg | ORAL_TABLET | Freq: Once | ORAL | Status: AC
  Administered 2023-02-24: 1000 mg via ORAL
  Filled 2023-02-24: qty 2

## 2023-02-24 MED ORDER — ONDANSETRON 4 MG PO TBDP
4.0000 mg | ORAL_TABLET | Freq: Once | ORAL | Status: AC
  Administered 2023-02-24: 4 mg via ORAL
  Filled 2023-02-24: qty 1

## 2023-02-24 MED ORDER — OXYCODONE HCL 5 MG PO TABS
5.0000 mg | ORAL_TABLET | Freq: Once | ORAL | Status: AC
  Administered 2023-02-24: 5 mg via ORAL
  Filled 2023-02-24: qty 1

## 2023-02-24 NOTE — Discharge Instructions (Signed)
Please follow-up with the urologist in the clinic to discuss the blood in your urine.  Keep an eye out while you are going to the bathroom to tease out if this is possibly vaginal versus urine related.  If you develop any fevers, severe pain, passing out or other worsening symptoms then please return to the ED

## 2023-02-24 NOTE — ED Provider Notes (Signed)
[facility] Provider Note    Event Date/Time   First MD Initiated Contact with Patient 02/24/23 0103     (approximate)   History   Hematuria   HPI  Regina Carroll is a 62 y.o. female who presents to the ED for evaluation of Hematuria   Review ED visit from 8/25, seen for a puncture wound to her right hand.  Started on Augmentin and Bactrim Also reviewed PCP visit from 8/1.  History of HTN, COPD and smoking, anxiety and depression.  Patient presents to the ED for evaluation of gross hematuria on 2 episodes today.  She reports sitting down to pass a bowel movement and void, after she was finished she noticed seemingly large-volume red blood within the toilet bowl, uncertain source.  Reports going back to the toilet and voiding and "blood just rushed out."  She believes it was urinary source, but uncertain about vaginal source.  Denies any dysuria but does report bilateral lower back pain without falls or trauma.  No fevers, abdominal pain or emesis.  She reports that she just finished 10 days of the Bactrim and Augmentin today   Physical Exam   Triage Vital Signs: ED Triage Vitals  Encounter Vitals Group     BP 02/23/23 2248 137/78     Systolic BP Percentile --      Diastolic BP Percentile --      Pulse Rate 02/23/23 2248 75     Resp 02/23/23 2248 18     Temp 02/23/23 2248 98.4 F (36.9 C)     Temp src --      SpO2 02/23/23 2248 97 %     Weight 02/23/23 2245 103 lb (46.7 kg)     Height 02/23/23 2245 5' (1.524 m)     Head Circumference --      Peak Flow --      Pain Score 02/23/23 2244 8     Pain Loc --      Pain Education --      Exclude from Growth Chart --     Most recent vital signs: Vitals:   02/23/23 2248  BP: 137/78  Pulse: 75  Resp: 18  Temp: 98.4 F (36.9 C)  SpO2: 97%    General: Awake, no distress.  CV:  Good peripheral perfusion.  Resp:  Normal effort.  Abd:  No distention.  Soft, minimal suprapubic tenderness.   No peritoneal features. MSK:  No deformity noted.  Bilateral paraspinal lumbar tenderness to palpation.  Midline spinal tenderness.  No CVA tenderness Neuro:  No focal deficits appreciated. Other:     ED Results / Procedures / Treatments   Labs (all labs ordered are listed, but only abnormal results are displayed) Labs Reviewed  CBC - Abnormal; Notable for the following components:      Result Value   WBC 12.1 (*)    All other components within normal limits  URINALYSIS, ROUTINE W REFLEX MICROSCOPIC - Abnormal; Notable for the following components:   Color, Urine YELLOW (*)    APPearance CLOUDY (*)    Hgb urine dipstick LARGE (*)    Ketones, ur 5 (*)    Protein, ur 100 (*)    Leukocytes,Ua TRACE (*)    All other components within normal limits  URINE CULTURE  LIPASE, BLOOD  COMPREHENSIVE METABOLIC PANEL    EKG   RADIOLOGY CT renal study interpreted by me with intrarenal stones but no evidence of ureteral obstruction  Official radiology  report(s): CT Renal Stone Study  Result Date: 02/24/2023 CLINICAL DATA:  Flank pain and hematuria. Evaluate ureteral stone. Hematuria and lower abdominal pain. EXAM: CT ABDOMEN AND PELVIS WITHOUT CONTRAST TECHNIQUE: Multidetector CT imaging of the abdomen and pelvis was performed following the standard protocol without IV contrast. RADIATION DOSE REDUCTION: This exam was performed according to the departmental dose-optimization program which includes automated exposure control, adjustment of the mA and/or kV according to patient size and/or use of iterative reconstruction technique. COMPARISON:  CT with IV contrast 11/16/2019, MRI abdomen 12/06/2019 FINDINGS: Lower chest: Lung bases again show mild emphysematous and scarring changes. There is a 4 mm chronic nodule in the anterior left lung base on 4:15. The cardiac size is normal. Hepatobiliary: Multiple scattered hepatic cysts are again seen. There are additional tiny hypodensities which are too  small to characterize but no new abnormality of the liver is seen without contrast. The gallbladder bile ducts are unremarkable. Pancreas: Unremarkable without contrast. Spleen: Unremarkable without contrast. Adrenals/Urinary Tract: There is no adrenal mass. Bilateral Bosniak 1 and Bosniak 2 renal cysts are again noted and scattered cortical lesions which are too small to characterize. Some of the cysts bilateral bilaterally are hyperdense. Largest cyst on the left is posteriorly measuring 2.6 cm and 78 Hounsfield units. Largest cyst on the right is off the lower pole measuring 2.7 cm, Hounsfield density is 25, appeared simple on the MRI and is only slightly larger today. Other of the larger cysts are also slightly larger than 3 years ago. There is scattered cortical nephrolithiasis. There is a 7 nonobstructive caliceal stone in the inferior pole of the right kidney. There are a few punctate nonobstructive stones in the right upper pole. No collecting system stones are seen on the left. No ureteral stone is evident. There is no hydronephrosis bilaterally. There is no thickening of the bladder wall. A tiny calcification in the midline anterior bladder wall is noted but it was present previously. No bladder stones are otherwise seen. Stomach/Bowel: No dilatation or wall thickening including the appendix. Moderate retained stool noted ascending and proximal transverse colon, mild retained stool distal transverse and proximal descending colon. Scattered uncomplicated diverticulosis. Vascular/Lymphatic: Aortic atherosclerosis. No enlarged abdominal or pelvic lymph nodes. Multiple pelvic phleboliths. Reproductive: Uterus and bilateral adnexa are unremarkable. Other: No abdominal wall hernia or abnormality. No abdominopelvic ascites. Musculoskeletal: Mild degenerative change lumbar and lower thoracic spine. No acute or other significant osseous findings or suspicious lesions. IMPRESSION: 1. Nonobstructive nephrolithiasis.  No ureteral stones or hydronephrosis. Additional scattered cortical nephrolithiasis. 2. Single tiny calcification in the midline anterior bladder wall, seen on the prior study. No bladder thickening. 3. Bilateral renal cysts, some of which are hyperdense, some of which are slightly larger than 3 years ago. No suspicious lesions. No follow-up imaging recommended. 4. Multiple hepatic cysts and additional tiny hypodensities which are too small to characterize. 5. Constipation and diverticulosis. 6. Aortic atherosclerosis. 7. 4 mm chronic nodule in the left lung base. Aortic Atherosclerosis (ICD10-I70.0). Electronically Signed   By: Almira Bar M.D.   On: 02/24/2023 02:50    PROCEDURES and INTERVENTIONS:  Procedures  Medications  ondansetron (ZOFRAN-ODT) disintegrating tablet 4 mg (4 mg Oral Given 02/24/23 0223)  oxyCODONE (Oxy IR/ROXICODONE) immediate release tablet 5 mg (5 mg Oral Given 02/24/23 0223)  acetaminophen (TYLENOL) tablet 1,000 mg (1,000 mg Oral Given 02/24/23 0223)     IMPRESSION / MDM / ASSESSMENT AND PLAN / ED COURSE  I reviewed the triage vital signs and the  nursing notes.  Differential diagnosis includes, but is not limited to, vaginal bleeding or endometrial cancer, ureteral stone, bleeding hemorrhoid  {Patient presents with symptoms of an acute illness or injury that is potentially life-threatening.  Patient presents with concerns for gross hematuria that sound like could be either urinary or vaginal source.  Look systemically well with a benign abdomen.  Urine with blood, trace leukocytes but she has been on Bactrim and denies any dysuria and I doubt cystitis.  Normal lipase and renal function on metabolic panel.  CT renal study without ureteral obstruction or clear etiology of her symptoms.  Her hemoglobin is normal, symptoms are controlled and suitable for outpatient management.  Referred to urology.  Clinical Course as of 02/24/23 0340  Thu Feb 24, 2023  0329 Reassessed,  patient reports feeling much better and she is appreciative.  She does look improved.  We discussed CT results with intrarenal nephrolithiasis.  She confirms that she has been taking the antibiotics and just finished them today and does not have any dysuria.  We discussed possible etiologies of symptoms.  We discussed urology follow-up.  We discussed keeping an eye out for bleeding to help elucidate urine versus vaginal source.  We discussed ED return precautions. [DS]    Clinical Course User Index [DS] Delton Prairie, MD     FINAL CLINICAL IMPRESSION(S) / ED DIAGNOSES   Final diagnoses:  Gross hematuria     Rx / DC Orders   ED Discharge Orders     None        Note:  This document was prepared using Dragon voice recognition software and may include unintentional dictation errors.   Delton Prairie, MD 02/24/23 [phone]

## 2023-02-25 LAB — URINE CULTURE

## 2023-02-28 NOTE — Group Note (Deleted)

## 2023-03-02 ENCOUNTER — Other Ambulatory Visit: Payer: Self-pay

## 2023-03-02 ENCOUNTER — Emergency Department
Admission: EM | Admit: 2023-03-02 | Discharge: 2023-03-02 | Disposition: A | Discharge status: Home / Self Care | Payer: BC Managed Care – PPO | Attending: Emergency Medicine | Admitting: Emergency Medicine

## 2023-03-02 DIAGNOSIS — R079 Chest pain, unspecified: Secondary | ICD-10-CM | POA: Diagnosis present

## 2023-03-02 DIAGNOSIS — W57XXXA Bitten or stung by nonvenomous insect and other nonvenomous arthropods, initial encounter: Secondary | ICD-10-CM | POA: Insufficient documentation

## 2023-03-02 DIAGNOSIS — J449 Chronic obstructive pulmonary disease, unspecified: Secondary | ICD-10-CM | POA: Insufficient documentation

## 2023-03-02 LAB — BASIC METABOLIC PANEL
Anion gap: 8 (ref 5–15)
BUN: 14 mg/dL (ref 8–23)
CO2: 25 mmol/L (ref 22–32)
Calcium: 8.8 mg/dL — ABNORMAL LOW (ref 8.9–10.3)
Chloride: 106 mmol/L (ref 98–111)
Creatinine, Ser: 0.54 mg/dL (ref 0.44–1.00)
GFR, Estimated: >60 mL/min (ref >60)
Glucose, Bld: 120 mg/dL — ABNORMAL HIGH (ref 70–99)
Potassium: 3.8 mmol/L (ref 3.5–5.1)
Sodium: 139 mmol/L (ref 135–145)

## 2023-03-02 LAB — CBC
HCT: 35.5 % — ABNORMAL LOW (ref 36.0–46.0)
Hemoglobin: 11.9 g/dL — ABNORMAL LOW (ref 12.0–15.0)
MCH: 30.1 pg (ref 26.0–34.0)
MCHC: 33.5 g/dL (ref 30.0–36.0)
MCV: 89.6 fL (ref 80.0–100.0)
Platelets: 337 10*3/uL (ref 150–400)
RBC: 3.96 MIL/uL (ref 3.87–5.11)
RDW: 12.6 % (ref 11.5–15.5)
WBC: 8.1 10*3/uL (ref 4.0–10.5)
nRBC: 0 % (ref 0.0–0.2)

## 2023-03-02 LAB — TROPONIN I (HIGH SENSITIVITY): Troponin I (High Sensitivity): 4 ng/L (ref <18)

## 2023-03-02 MED ORDER — PREDNISONE 20 MG PO TABS
60.0000 mg | ORAL_TABLET | ORAL | Status: AC
  Administered 2023-03-02: 60 mg via ORAL
  Filled 2023-03-02: qty 3

## 2023-03-02 MED ORDER — HYDROXYZINE HCL 25 MG PO TABS: 25.0000 mg | ORAL_TABLET | Freq: Every evening | ORAL | 0 refills | Status: AC | PRN

## 2023-03-02 MED ORDER — LORATADINE 10 MG PO TABS
10.0000 mg | ORAL_TABLET | Freq: Once | ORAL | Status: AC
  Administered 2023-03-02: 10 mg via ORAL
  Filled 2023-03-02: qty 1

## 2023-03-02 MED ORDER — PREDNISONE 10 MG PO TABS
ORAL_TABLET | ORAL | 0 refills | Status: DC
Start: 2023-03-02 — End: 2023-04-08

## 2023-03-02 NOTE — ED Provider Notes (Signed)
[facility] Provider Note    Event Date/Time   First MD Initiated Contact with Patient 03/02/23 1420     (approximate)   History   Chest Pain and Insect Bite   HPI Regina Carroll is a 62 y.o. female  who presents for evaluation of multiple insect bites that occurred yesterday afternoon/evening.  She said she is not sure what she got into, but she got into a nest of something and she got multiple bites to her arms, some on her legs, and even her right ear.  She said they immediately swelled up and turned red.  They have not swelled significantly since then, but they have become increasingly itchy.  They are not particularly painful but there "driving me crazy" because of the degree of itchiness.  She was concerned that they might be spider bites.  She said that when she checked in to the emergency department she was feeling a little bit of tightness in her chest but she thinks that that was probably anxiety because the sooner she got settled and she felt better.  She is not having any trouble breathing.  She has a history of COPD but it is generally well-controlled.  She has had no fever, chest pain, nausea, vomiting, nor abdominal pain.  She has no allergies of which she is aware.     Physical Exam   Triage Vital Signs: ED Triage Vitals  Encounter Vitals Group     BP 03/02/23 1405 128/65     Systolic BP Percentile --      Diastolic BP Percentile --      Pulse Rate 03/02/23 1405 80     Resp 03/02/23 1405 16     Temp 03/02/23 1405 98.6 F (37 C)     Temp src --      SpO2 03/02/23 1405 99 %     Weight 03/02/23 1406 45.8 kg (101 lb)     Height 03/02/23 1406 1.524 m (5')     Head Circumference --      Peak Flow --      Pain Score 03/02/23 1405 5     Pain Loc --      Pain Education --      Exclude from Growth Chart --     Most recent vital signs: Vitals:   03/02/23 1405  BP: 128/65  Pulse: 80  Resp: 16  Temp: 98.6 F (37 C)  SpO2: 99%     General: Awake, generally well-appearing.  Mood and affect are normal and appropriate. CV:  Good peripheral perfusion.  Regular rate and rhythm.  Normal heart sounds. Resp:  Normal effort. Speaking easily and comfortably, no accessory muscle usage nor intercostal retractions.  Lungs are auscultation with no wheezing or coarse breath sounds. Abd:  No distention.  Other:  Patient has multiple edematous and erythematous lesions mostly on her upper extremities but a few on her legs and 1 on her ear consistent with insect bite with localized inflammatory reaction.  No evidence of cellulitis or abscess.  No "holes" to suggest spider bites.   ED Results / Procedures / Treatments   Labs (all labs ordered are listed, but only abnormal results are displayed) Labs Reviewed  BASIC METABOLIC PANEL - Abnormal; Notable for the following components:      Result Value   Glucose, Bld 120 (*)    Calcium 8.8 (*)    All other components within normal limits  CBC - Abnormal; Notable for  the following components:   Hemoglobin 11.9 (*)    HCT 35.5 (*)    All other components within normal limits  TROPONIN I (HIGH SENSITIVITY)     EKG  ED ECG REPORT I, Loleta Rose, the attending physician, personally viewed and interpreted this ECG.  Date: 03/02/2023 EKG Time: 14: 12 Rate: 74 Rhythm: normal sinus rhythm QRS Axis: normal Intervals: normal ST/T Wave abnormalities: normal Narrative Interpretation: no evidence of acute ischemia     PROCEDURES:  Critical Care performed: No  Procedures    IMPRESSION / MDM / ASSESSMENT AND PLAN / ED COURSE  I reviewed the triage vital signs and the nursing notes.                              Differential diagnosis includes, but is not limited to, inflammatory reaction to insect bites, allergic reaction, abscess, cellulitis, much less likely PE or ACS.  Patient's presentation is most consistent with acute presentation with potential threat to life or  bodily function.  Labs/studies ordered: EKG, CBC, BMP, high-sensitivity troponin  Interventions/Medications given:  Medications  loratadine (CLARITIN) tablet 10 mg (has no administration in time range)  predniSONE (DELTASONE) tablet 60 mg (has no administration in time range)    (Note:  hospital course my include additional interventions and/or labs/studies not listed above.)   Patient's vital signs are stable and within normal limits.  Patient initially reported some chest tightness when she was checking in but I am sure based on her history of present illness (including her own report) that this was due to anxiety and frustration over the pruritus.  She is low risk for ACS based on HEAR score and her Wells score for PE is 0.  Labs are all essentially normal and EKG shows no evidence of ischemia.  Patient is having no symptoms of anaphylaxis.  She had a near immediate inflammatory reaction to multiple insect bites which has been essentially stable since yesterday.  I recommended daily cetirizine and wrote a prescription for Atarax to use in the evenings to help her sleep.  I also gave her a dose of prednisone 60 mg and wrote a prescription for a prednisone taper.  She is familiar with this medication due to her history of COPD.  She will follow-up with her primary care doctor.  No indication for antibiotics.  She understands and agrees with the plan.         FINAL CLINICAL IMPRESSION(S) / ED DIAGNOSES   Final diagnoses:  Multiple insect bites     Rx / DC Orders   ED Discharge Orders          Ordered    predniSONE (DELTASONE) 10 MG tablet        03/02/23 1521    hydrOXYzine (ATARAX) 25 MG tablet  At bedtime PRN        03/02/23 1521             Note:  This document was prepared using Dragon voice recognition software and may include unintentional dictation errors.   Loleta Rose, MD 03/02/23 [phone]

## 2023-03-02 NOTE — Discharge Instructions (Addendum)
As we discussed, we do not believe that you have an infection from the multiple insect bites, but you definitely have an inflammatory reaction to them.  The prednisone we gave you in the emergency department as well as the prescription for 5 days worth of prednisone should help a lot.  Additionally, we recommend that you purchase a bottle of over-the-counter cetirizine tablets (Zyrtec) and take one each morning until the symptoms resolve (it might be a week or more).  Additionally, please use the prescribed Atarax according to label instructions, but it may be particularly helpful in the evening to help reduce the itching and help you sleep.  Follow-up with your regular doctor at the next fillable opportunity.  Return to the emergency department if you develop new or worsening symptoms that concern you.

## 2023-03-02 NOTE — ED Triage Notes (Signed)
Pt to ED for possible multiple insect bites yesterday while trimming bushes. States now feels chest pressure.  NAD noted. Ambulatory to triage.

## 2023-03-25 ENCOUNTER — Ambulatory Visit (INDEPENDENT_AMBULATORY_CARE_PROVIDER_SITE_OTHER): Payer: BC Managed Care – PPO | Admitting: Urology

## 2023-03-25 DIAGNOSIS — R310 Gross hematuria: Secondary | ICD-10-CM

## 2023-03-25 LAB — URINALYSIS, COMPLETE
Bilirubin, UA: NEGATIVE
Glucose, UA: NEGATIVE
Ketones, UA: NEGATIVE
Nitrite, UA: NEGATIVE
Specific Gravity, UA: 1.025 (ref 1.005–1.030)
Urobilinogen, Ur: 0.2 mg/dL (ref 0.2–1.0)
pH, UA: 6 (ref 5.0–7.5)

## 2023-03-25 LAB — MICROSCOPIC EXAMINATION: Epithelial Cells (non renal): >10 /[HPF] — AB (ref 0–10)

## 2023-03-25 NOTE — Progress Notes (Signed)
I, Maysun Anabel Bene, acting as a scribe for Riki Altes, MD., have documented all relevant documentation on the behalf of Riki Altes, MD, as directed by Riki Altes, MD while in the presence of Riki Altes, MD.  03/25/2023 2:17 PM   Regina Carroll 11/07/1960 [id number]  Referring provider: Marguarite Arbour, MD [address] [facility] [place],  [place] [address]  Chief Complaint  Patient presents with   Hematuria    HPI: Regina Carroll is a 62 y.o. female referred for evaluation of gross hematuria.   [facility] ED visit 02/24/2023 after 2 episodes of gross hematuria, which she states looked like "pure blood" She has had bilateral low back pain and mild pelvic discomfort. A renal stone CT was ordered in the ED which showed bilateral Bosniak 1/Bosniak 2 cysts which were stable compared with prior imaging. There was a 7mm non-obstructing right lower pole calculus and punctate right upper pole renal calculi. She was discharged with instructions of urology follow-up. On 03/12/23, she had a recurrent episode of gross hematuria associated with left flank pain. Urinalysis at that visit showed moderate leukocytes, 9 RBC/6 WBC and urine culture grew mixed flora. She states she had a CT at that visit, though I do not see that a CT was performed in care everywhere.   PMH: Past Medical History:  Diagnosis Date   Abnormal cervical cytology    Adjustment reaction with anxiety and depression    Anxiety    COPD (chronic obstructive pulmonary disease) (HCC)    Depression    Diarrhea    Family history of adverse reaction to anesthesia    mother difficulty waking up   GERD (gastroesophageal reflux disease)    Gonorrhea    Gout    History of acute PID    Hx of trichomoniasis    Hypertension    Plantar fasciitis     Surgical History: Past Surgical History:  Procedure Laterality Date   BREAST EXCISIONAL BIOPSY Left 02/17/2009   neg   breast mass removed      CARPAL TUNNEL RELEASE Right 01/06/2022   Procedure: ENDOSCOPIC RIGHT CARPAL TUNNEL RELEASE WITH RELEASE OF RIGHT LONG TRIGGER FINGER.;  Surgeon: Christena Flake, MD;  Location: [facility] ORS;  Service: Orthopedics;  Laterality: Right;   CESAREAN SECTION Bilateral unk   COLONOSCOPY WITH PROPOFOL N/A 09/28/2016   Procedure: COLONOSCOPY WITH PROPOFOL;  Surgeon: Christena Deem, MD;  Location: [facility] ENDOSCOPY;  Service: Endoscopy;  Laterality: N/A;   COLONOSCOPY WITH PROPOFOL N/A 11/21/2019   Procedure: COLONOSCOPY WITH PROPOFOL;  Surgeon: Earline Mayotte, MD;  Location: [facility] ENDOSCOPY;  Service: Endoscopy;  Laterality: N/A;   ESOPHAGOGASTRODUODENOSCOPY (EGD) WITH PROPOFOL N/A 09/28/2016   Procedure: ESOPHAGOGASTRODUODENOSCOPY (EGD) WITH PROPOFOL;  Surgeon: Christena Deem, MD;  Location: [facility] ENDOSCOPY;  Service: Endoscopy;  Laterality: N/A;   TUBAL LIGATION      Home Medications:  Allergies as of 03/25/2023       Reactions   Codeine    Penicillins Rash   Has patient had a PCN reaction causing immediate rash, facial/tongue/throat swelling, SOB or lightheadedness with hypotension: Unknown Has patient had a PCN reaction causing severe rash involving mucus membranes or skin necrosis: Unknown Has patient had a PCN reaction that required hospitalization: Unknown Has patient had a PCN reaction occurring within the last 10 years: Unknown If all of the above answers are "NO", then may proceed with Cephalosporin use.  Medication List        Accurate as of March 25, 2023  2:17 PM. If you have any questions, ask your nurse or doctor.          albuterol 108 (90 Base) MCG/ACT inhaler Commonly known as: VENTOLIN HFA Inhale 2 puffs into the lungs every 6 (six) hours as needed for wheezing or shortness of breath.   albuterol 108 (90 Base) MCG/ACT inhaler Commonly known as: VENTOLIN HFA Inhale 2 puffs into the lungs every 6 (six) hours as needed.   allopurinol 100 MG tablet Commonly known  as: ZYLOPRIM Take 100 mg by mouth daily.   amphetamine-dextroamphetamine 20 MG tablet Commonly known as: ADDERALL Take 20 mg by mouth daily.   Breztri Aerosphere 160-9-4.8 MCG/ACT Aero Generic drug: Budeson-Glycopyrrol-Formoterol Inhale 2 puffs into the lungs 2 (two) times daily.   citalopram 20 MG tablet Commonly known as: CELEXA Take 20 mg by mouth daily.   colchicine 0.6 MG tablet Take 0.6 mg by mouth daily as needed.   cyanocobalamin 1000 MCG tablet Commonly known as: VITAMIN B12 Take 1,000 mcg by mouth daily.   hydrochlorothiazide 25 MG tablet Commonly known as: HYDRODIURIL Take 25 mg by mouth daily.   hydrOXYzine 25 MG tablet Commonly known as: ATARAX Take 1 tablet (25 mg total) by mouth at bedtime as needed for itching.   methocarbamol 500 MG tablet Commonly known as: ROBAXIN Take 1 tablet by mouth 3 (three) times daily.   Potassium Gluconate 595 MG Tbcr Take 595 mg by mouth daily.   predniSONE 10 MG tablet Commonly known as: DELTASONE Take 4 tabs (40 mg) PO x 3 days, then take 2 tabs (20 mg) PO x 3 days, then take 1 tab (10 mg) PO x 3 days, then take 1/2 tab (5 mg) PO x 4 days.   PriLOSEC OTC 20 MG tablet Generic drug: omeprazole Take 20 mg by mouth daily.   SUMAtriptan 50 MG tablet Commonly known as: IMITREX Take 50 mg by mouth every 2 (two) hours as needed for migraine. May repeat in 2 hours if headache persists or recurs.   traMADol 50 MG tablet Commonly known as: Ultram Take 1 tablet (50 mg total) by mouth every 6 (six) hours as needed.        Allergies:  Allergies  Allergen Reactions   Codeine    Penicillins Rash    Has patient had a PCN reaction causing immediate rash, facial/tongue/throat swelling, SOB or lightheadedness with hypotension: Unknown Has patient had a PCN reaction causing severe rash involving mucus membranes or skin necrosis: Unknown Has patient had a PCN reaction that required hospitalization: Unknown Has patient had a PCN  reaction occurring within the last 10 years: Unknown If all of the above answers are "NO", then may proceed with Cephalosporin use.     Family History: Family History  Problem Relation Age of Onset   Breast cancer Maternal Grandmother     Social History:  reports that she has been smoking cigarettes. She has a 25 pack-year smoking history. She has never used smokeless tobacco. She reports that she does not drink alcohol and does not use drugs.   Physical Exam: LMP 12/09/2000 (Approximate)   Constitutional:  Alert and oriented, No acute distress. HEENT: [place] AT, moist mucus membranes.  Trachea midline, no masses. Cardiovascular: No clubbing, cyanosis, or edema. Respiratory: Normal respiratory effort, no increased work of breathing. GI: Abdomen is soft, nontender, nondistended, no abdominal masses Skin: No rashes, bruises or suspicious lesions. Neurologic:  Grossly intact, no focal deficits, moving all 4 extremities. Psychiatric: Normal mood and affect.   Urinalysis Pending   Pertinent Imaging: CT was personally reviewed and interpreted.   CT Renal Stone Study  Narrative CLINICAL DATA:  Flank pain and hematuria. Evaluate ureteral stone. Hematuria and lower abdominal pain.  EXAM: CT ABDOMEN AND PELVIS WITHOUT CONTRAST  TECHNIQUE: Multidetector CT imaging of the abdomen and pelvis was performed following the standard protocol without IV contrast.  RADIATION DOSE REDUCTION: This exam was performed according to the departmental dose-optimization program which includes automated exposure control, adjustment of the mA and/or kV according to patient size and/or use of iterative reconstruction technique.  COMPARISON:  CT with IV contrast 11/16/2019, MRI abdomen 12/06/2019  FINDINGS: Lower chest: Lung bases again show mild emphysematous and scarring changes. There is a 4 mm chronic nodule in the anterior left lung base on 4:15. The cardiac size is normal.  Hepatobiliary:  Multiple scattered hepatic cysts are again seen. There are additional tiny hypodensities which are too small to characterize but no new abnormality of the liver is seen without contrast. The gallbladder bile ducts are unremarkable.  Pancreas: Unremarkable without contrast.  Spleen: Unremarkable without contrast.  Adrenals/Urinary Tract: There is no adrenal mass. Bilateral Bosniak 1 and Bosniak 2 renal cysts are again noted and scattered cortical lesions which are too small to characterize. Some of the cysts bilateral bilaterally are hyperdense.  Largest cyst on the left is posteriorly measuring 2.6 cm and 78 Hounsfield units.  Largest cyst on the right is off the lower pole measuring 2.7 cm, Hounsfield density is 25, appeared simple on the MRI and is only slightly larger today.  Other of the larger cysts are also slightly larger than 3 years ago. There is scattered cortical nephrolithiasis.  There is a 7 nonobstructive caliceal stone in the inferior pole of the right kidney. There are a few punctate nonobstructive stones in the right upper pole.  No collecting system stones are seen on the left. No ureteral stone is evident.  There is no hydronephrosis bilaterally. There is no thickening of the bladder wall.  A tiny calcification in the midline anterior bladder wall is noted but it was present previously. No bladder stones are otherwise seen.  Stomach/Bowel: No dilatation or wall thickening including the appendix. Moderate retained stool noted ascending and proximal transverse colon, mild retained stool distal transverse and proximal descending colon. Scattered uncomplicated diverticulosis.  Vascular/Lymphatic: Aortic atherosclerosis. No enlarged abdominal or pelvic lymph nodes. Multiple pelvic phleboliths.  Reproductive: Uterus and bilateral adnexa are unremarkable.  Other: No abdominal wall hernia or abnormality. No abdominopelvic ascites.  Musculoskeletal: Mild  degenerative change lumbar and lower thoracic spine. No acute or other significant osseous findings or suspicious lesions.  IMPRESSION: 1. Nonobstructive nephrolithiasis. No ureteral stones or hydronephrosis. Additional scattered cortical nephrolithiasis. 2. Single tiny calcification in the midline anterior bladder wall, seen on the prior study. No bladder thickening. 3. Bilateral renal cysts, some of which are hyperdense, some of which are slightly larger than 3 years ago. No suspicious lesions. No follow-up imaging recommended. 4. Multiple hepatic cysts and additional tiny hypodensities which are too small to characterize. 5. Constipation and diverticulosis. 6. Aortic atherosclerosis. 7. 4 mm chronic nodule in the left lung base.  Aortic Atherosclerosis (ICD10-I70.0).   Electronically Signed By: Almira Bar M.D. On: 02/24/2023 02:50   Assessment & Plan:    1. Gross hematuria Recurrent gross hematuria Non-contrast CT abdomen pelvis was performed with no obvious upper tract  abnormalities that would account for gross hematuria.  Discussed cystoscopy for evaluation of the lower tract and recommend scheduling, which she was in agreement.  If no obvious abnormalities in lower tract imaging, we'll schedule a CT urogram.  [facility] [address], [address] [place], [place] [address] [phone]

## 2023-03-26 ENCOUNTER — Encounter: Payer: Self-pay | Admitting: Urology

## 2023-03-29 ENCOUNTER — Telehealth: Payer: Self-pay

## 2023-03-29 NOTE — Telephone Encounter (Signed)
Urinalysis showed >10 epithelial cells due to vaginal contamination.  If she is having symptoms recommend repeat UA.  If there is still epithelial contamination will need a catheterized specimen

## 2023-03-29 NOTE — Telephone Encounter (Signed)
Pt informed, voiced understanding. Pt is scheduled to come in on 04/08/2023 for a Cath UA.

## 2023-03-29 NOTE — Telephone Encounter (Signed)
Pt called triage line, asking about UA results from 03/25/2023. There was no culture was sent. Pt is wondering if she needs antibiotics. Please advise.

## 2023-04-08 ENCOUNTER — Encounter: Payer: Self-pay | Admitting: Physician Assistant

## 2023-04-08 ENCOUNTER — Ambulatory Visit (INDEPENDENT_AMBULATORY_CARE_PROVIDER_SITE_OTHER): Payer: BC Managed Care – PPO | Admitting: Physician Assistant

## 2023-04-08 VITALS — BP 144/75 | HR 71 | Ht 60.0 in | Wt 105.0 lb

## 2023-04-08 DIAGNOSIS — R82998 Other abnormal findings in urine: Secondary | ICD-10-CM

## 2023-04-08 DIAGNOSIS — R310 Gross hematuria: Secondary | ICD-10-CM

## 2023-04-08 LAB — MICROSCOPIC EXAMINATION

## 2023-04-08 LAB — URINALYSIS, COMPLETE
Bilirubin, UA: NEGATIVE
Glucose, UA: NEGATIVE
Ketones, UA: NEGATIVE
Leukocytes,UA: NEGATIVE
Nitrite, UA: NEGATIVE
Protein,UA: NEGATIVE
RBC, UA: NEGATIVE
Specific Gravity, UA: 1.015 (ref 1.005–1.030)
Urobilinogen, Ur: 0.2 mg/dL (ref 0.2–1.0)
pH, UA: 7 (ref 5.0–7.5)

## 2023-04-08 NOTE — Progress Notes (Signed)
In and Out Catheterization  Patient is present today for a I & O catheterization due to contaminated specimen on previous urinalysis. Patient was cleaned and prepped in a sterile fashion with betadine . A 14FR cath was inserted no complications were noted, of urine return was noted, urine was yellow in color. A clean urine sample was collected for urinalysis. Bladder was drained and the catheter was removed with out difficulty.    Performed by: Debbe Bales, CMA   Follow up/ Additional notes: Provider to call with results.

## 2023-05-04 ENCOUNTER — Other Ambulatory Visit: Payer: BC Managed Care – PPO | Admitting: Urology

## 2023-05-05 ENCOUNTER — Encounter: Payer: Self-pay | Admitting: Urology

## 2023-07-12 DIAGNOSIS — L821 Other seborrheic keratosis: Secondary | ICD-10-CM | POA: Diagnosis not present

## 2023-07-12 DIAGNOSIS — L700 Acne vulgaris: Secondary | ICD-10-CM | POA: Diagnosis not present

## 2023-07-19 DIAGNOSIS — E0590 Thyrotoxicosis, unspecified without thyrotoxic crisis or storm: Secondary | ICD-10-CM | POA: Diagnosis not present

## 2023-07-19 DIAGNOSIS — J431 Panlobular emphysema: Secondary | ICD-10-CM | POA: Diagnosis not present

## 2023-07-19 DIAGNOSIS — M5412 Radiculopathy, cervical region: Secondary | ICD-10-CM | POA: Diagnosis not present

## 2023-07-19 DIAGNOSIS — F4323 Adjustment disorder with mixed anxiety and depressed mood: Secondary | ICD-10-CM | POA: Diagnosis not present

## 2023-07-19 DIAGNOSIS — I10 Essential (primary) hypertension: Secondary | ICD-10-CM | POA: Diagnosis not present

## 2023-07-19 DIAGNOSIS — E782 Mixed hyperlipidemia: Secondary | ICD-10-CM | POA: Diagnosis not present

## 2023-07-19 DIAGNOSIS — F1021 Alcohol dependence, in remission: Secondary | ICD-10-CM | POA: Diagnosis not present

## 2023-07-25 ENCOUNTER — Emergency Department
Admission: EM | Admit: 2023-07-25 | Discharge: 2023-07-25 | Discharge status: Against Medical Advice | Payer: Self-pay | Attending: Emergency Medicine | Admitting: Emergency Medicine

## 2023-07-25 ENCOUNTER — Other Ambulatory Visit: Payer: Self-pay

## 2023-07-25 ENCOUNTER — Emergency Department: Payer: HMO

## 2023-07-25 ENCOUNTER — Encounter: Payer: Self-pay | Admitting: Emergency Medicine

## 2023-07-25 DIAGNOSIS — K5900 Constipation, unspecified: Secondary | ICD-10-CM | POA: Insufficient documentation

## 2023-07-25 DIAGNOSIS — Z5321 Procedure and treatment not carried out due to patient leaving prior to being seen by health care provider: Secondary | ICD-10-CM | POA: Insufficient documentation

## 2023-07-25 DIAGNOSIS — K5732 Diverticulitis of large intestine without perforation or abscess without bleeding: Secondary | ICD-10-CM | POA: Diagnosis not present

## 2023-07-25 DIAGNOSIS — Z79899 Other long term (current) drug therapy: Secondary | ICD-10-CM | POA: Diagnosis not present

## 2023-07-25 DIAGNOSIS — R109 Unspecified abdominal pain: Secondary | ICD-10-CM | POA: Diagnosis present

## 2023-07-25 LAB — COMPREHENSIVE METABOLIC PANEL
ALT: 15 U/L (ref 0–44)
AST: 20 U/L (ref 15–41)
Albumin: 3.8 g/dL (ref 3.5–5.0)
Alkaline Phosphatase: 78 U/L (ref 38–126)
Anion gap: 11 (ref 5–15)
BUN: 15 mg/dL (ref 8–23)
CO2: 22 mmol/L (ref 22–32)
Calcium: 9.1 mg/dL (ref 8.9–10.3)
Chloride: 106 mmol/L (ref 98–111)
Creatinine, Ser: 0.67 mg/dL (ref 0.44–1.00)
GFR, Estimated: >60 mL/min (ref >60)
Glucose, Bld: 138 mg/dL — ABNORMAL HIGH (ref 70–99)
Potassium: 3.8 mmol/L (ref 3.5–5.1)
Sodium: 139 mmol/L (ref 135–145)
Total Bilirubin: 0.5 mg/dL (ref 0.0–1.2)
Total Protein: 6.5 g/dL (ref 6.5–8.1)

## 2023-07-25 LAB — CBC
HCT: 37.5 % (ref 36.0–46.0)
Hemoglobin: 12.5 g/dL (ref 12.0–15.0)
MCH: 29.7 pg (ref 26.0–34.0)
MCHC: 33.3 g/dL (ref 30.0–36.0)
MCV: 89.1 fL (ref 80.0–100.0)
Platelets: 329 10*3/uL (ref 150–400)
RBC: 4.21 MIL/uL (ref 3.87–5.11)
RDW: 12.4 % (ref 11.5–15.5)
WBC: 8 10*3/uL (ref 4.0–10.5)
nRBC: 0 % (ref 0.0–0.2)

## 2023-07-25 LAB — LIPASE, BLOOD: Lipase: 28 U/L (ref 11–51)

## 2023-07-25 NOTE — ED Triage Notes (Signed)
First nurse.   Amublatory. Says history of diverticulitis and has beenhaving trouble passing stool for > month.  Says goes very small amounts.  Taken multiple meds, lax, and took enema today without relief.

## 2023-07-25 NOTE — ED Triage Notes (Signed)
Pt in via POV, reports ongoing issue w/ constipation, taking stool softeners and laxatives but still having to "dig out feces."  Reports trying to use enema today but unable to get enema to dispense due to being impacted.  States this has been an ongoing problem x 1-2 months.  Endorses abdominal pain and severe bloat; denies N/V.  A/Ox4, ambulatory, NAD noted at this time.

## 2023-07-25 NOTE — ED Provider Triage Note (Signed)
Emergency Medicine Provider Triage Evaluation Note  Regina Carroll , a 63 y.o. female  was evaluated in triage.  Pt complains of constipation on going x 1 month. Took medication and tried to do enema without relief.  No new medication  Review of Systems  Positive: Abd pain, + diverticulitis. Negative: No fever, vomiting   Physical Exam  BP 136/67 (BP Location: Right Arm)   Pulse 67   Temp 98.2 F (36.8 C) (Oral)   Resp 15   LMP 12/09/2000 (Approximate)   SpO2 97%  Gen:   Awake, no distress   Resp:  Normal effort  MSK:   Moves extremities without difficulty  Other:    Medical Decision Making  Medically screening exam initiated at 2:16 PM.  Appropriate orders placed.  JOSI ROEDIGER was informed that the remainder of the evaluation will be completed by another provider, this initial triage assessment does not replace that evaluation, and the importance of remaining in the ED until their evaluation is complete.     Tommi Rumps, PA-C 07/25/23 [phone]

## 2023-07-26 ENCOUNTER — Other Ambulatory Visit: Payer: Self-pay | Admitting: Physician Assistant

## 2023-07-26 DIAGNOSIS — R1032 Left lower quadrant pain: Secondary | ICD-10-CM

## 2023-07-27 ENCOUNTER — Ambulatory Visit: Admission: RE | Admit: 2023-07-27 | Payer: HMO | Source: Ambulatory Visit

## 2023-08-01 ENCOUNTER — Ambulatory Visit: Admission: RE | Admit: 2023-08-01 | Payer: HMO | Source: Ambulatory Visit

## 2023-08-01 ENCOUNTER — Ambulatory Visit
Admission: RE | Admit: 2023-08-01 | Discharge: 2023-08-01 | Disposition: A | Discharge status: Home / Self Care | Payer: HMO | Source: Ambulatory Visit | Attending: Physician Assistant | Admitting: Physician Assistant

## 2023-08-01 DIAGNOSIS — N281 Cyst of kidney, acquired: Secondary | ICD-10-CM | POA: Diagnosis not present

## 2023-08-01 DIAGNOSIS — K5730 Diverticulosis of large intestine without perforation or abscess without bleeding: Secondary | ICD-10-CM | POA: Diagnosis not present

## 2023-08-01 DIAGNOSIS — R1030 Lower abdominal pain, unspecified: Secondary | ICD-10-CM | POA: Diagnosis not present

## 2023-08-01 DIAGNOSIS — R1032 Left lower quadrant pain: Secondary | ICD-10-CM | POA: Insufficient documentation

## 2023-08-01 DIAGNOSIS — N200 Calculus of kidney: Secondary | ICD-10-CM | POA: Diagnosis not present

## 2023-08-01 MED ORDER — IOHEXOL 9 MG/ML PO SOLN
500.0000 mL | ORAL | Status: AC
  Administered 2023-08-01: 500 mL via ORAL

## 2023-08-01 MED ORDER — IOHEXOL 300 MG/ML  SOLN
100.0000 mL | Freq: Once | INTRAMUSCULAR | Status: AC | PRN
  Administered 2023-08-01: 100 mL via INTRAVENOUS

## 2023-09-23 ENCOUNTER — Other Ambulatory Visit: Payer: Self-pay | Admitting: Internal Medicine

## 2023-09-23 DIAGNOSIS — Z1231 Encounter for screening mammogram for malignant neoplasm of breast: Secondary | ICD-10-CM

## 2023-10-10 DIAGNOSIS — E782 Mixed hyperlipidemia: Secondary | ICD-10-CM | POA: Diagnosis not present

## 2023-10-10 DIAGNOSIS — F4323 Adjustment disorder with mixed anxiety and depressed mood: Secondary | ICD-10-CM | POA: Diagnosis not present

## 2023-10-10 DIAGNOSIS — R0789 Other chest pain: Secondary | ICD-10-CM | POA: Diagnosis not present

## 2023-10-10 DIAGNOSIS — R002 Palpitations: Secondary | ICD-10-CM | POA: Diagnosis not present

## 2023-10-10 DIAGNOSIS — I10 Essential (primary) hypertension: Secondary | ICD-10-CM | POA: Diagnosis not present

## 2023-10-10 DIAGNOSIS — E0590 Thyrotoxicosis, unspecified without thyrotoxic crisis or storm: Secondary | ICD-10-CM | POA: Diagnosis not present

## 2023-10-10 DIAGNOSIS — Z79899 Other long term (current) drug therapy: Secondary | ICD-10-CM | POA: Diagnosis not present

## 2023-10-13 DIAGNOSIS — H6123 Impacted cerumen, bilateral: Secondary | ICD-10-CM | POA: Diagnosis not present

## 2023-10-13 DIAGNOSIS — H60393 Other infective otitis externa, bilateral: Secondary | ICD-10-CM | POA: Diagnosis not present

## 2023-10-27 ENCOUNTER — Encounter (HOSPITAL_COMMUNITY): Payer: Self-pay

## 2023-11-03 ENCOUNTER — Encounter

## 2023-11-09 ENCOUNTER — Ambulatory Visit
Admission: RE | Admit: 2023-11-09 | Discharge: 2023-11-09 | Disposition: A | Discharge status: Home / Self Care | Source: Ambulatory Visit | Attending: Internal Medicine | Admitting: Internal Medicine

## 2023-11-09 DIAGNOSIS — Z1231 Encounter for screening mammogram for malignant neoplasm of breast: Secondary | ICD-10-CM | POA: Diagnosis not present

## 2023-12-19 DIAGNOSIS — Z720 Tobacco use: Secondary | ICD-10-CM | POA: Diagnosis not present

## 2023-12-19 DIAGNOSIS — R0789 Other chest pain: Secondary | ICD-10-CM | POA: Diagnosis not present

## 2023-12-19 DIAGNOSIS — R079 Chest pain, unspecified: Secondary | ICD-10-CM | POA: Diagnosis not present

## 2023-12-26 DIAGNOSIS — J431 Panlobular emphysema: Secondary | ICD-10-CM | POA: Diagnosis not present

## 2023-12-26 DIAGNOSIS — H9202 Otalgia, left ear: Secondary | ICD-10-CM | POA: Diagnosis not present

## 2024-01-03 DIAGNOSIS — R0789 Other chest pain: Secondary | ICD-10-CM | POA: Diagnosis not present

## 2024-01-10 DIAGNOSIS — R0789 Other chest pain: Secondary | ICD-10-CM | POA: Diagnosis not present

## 2024-01-10 DIAGNOSIS — Z720 Tobacco use: Secondary | ICD-10-CM | POA: Diagnosis not present

## 2024-01-16 DIAGNOSIS — R0789 Other chest pain: Secondary | ICD-10-CM | POA: Diagnosis not present

## 2024-01-16 DIAGNOSIS — R002 Palpitations: Secondary | ICD-10-CM | POA: Diagnosis not present

## 2024-01-27 DIAGNOSIS — R911 Solitary pulmonary nodule: Secondary | ICD-10-CM | POA: Diagnosis not present

## 2024-01-27 DIAGNOSIS — I10 Essential (primary) hypertension: Secondary | ICD-10-CM | POA: Diagnosis not present

## 2024-01-27 DIAGNOSIS — E782 Mixed hyperlipidemia: Secondary | ICD-10-CM | POA: Diagnosis not present

## 2024-01-27 DIAGNOSIS — E0590 Thyrotoxicosis, unspecified without thyrotoxic crisis or storm: Secondary | ICD-10-CM | POA: Diagnosis not present

## 2024-01-27 DIAGNOSIS — F4323 Adjustment disorder with mixed anxiety and depressed mood: Secondary | ICD-10-CM | POA: Diagnosis not present

## 2024-01-27 DIAGNOSIS — Z79899 Other long term (current) drug therapy: Secondary | ICD-10-CM | POA: Diagnosis not present

## 2024-01-27 DIAGNOSIS — J431 Panlobular emphysema: Secondary | ICD-10-CM | POA: Diagnosis not present

## 2024-01-27 DIAGNOSIS — Z0000 Encounter for general adult medical examination without abnormal findings: Secondary | ICD-10-CM | POA: Diagnosis not present

## 2024-02-01 ENCOUNTER — Other Ambulatory Visit: Payer: Self-pay | Admitting: Internal Medicine

## 2024-02-01 DIAGNOSIS — R911 Solitary pulmonary nodule: Secondary | ICD-10-CM

## 2024-02-01 DIAGNOSIS — Z0000 Encounter for general adult medical examination without abnormal findings: Secondary | ICD-10-CM

## 2024-02-14 ENCOUNTER — Ambulatory Visit
Admission: RE | Admit: 2024-02-14 | Discharge: 2024-02-14 | Disposition: A | Discharge status: Home / Self Care | Source: Ambulatory Visit | Attending: Internal Medicine | Admitting: Internal Medicine

## 2024-02-14 DIAGNOSIS — Z0000 Encounter for general adult medical examination without abnormal findings: Secondary | ICD-10-CM | POA: Diagnosis not present

## 2024-02-14 DIAGNOSIS — J439 Emphysema, unspecified: Secondary | ICD-10-CM | POA: Diagnosis not present

## 2024-02-14 DIAGNOSIS — R911 Solitary pulmonary nodule: Secondary | ICD-10-CM | POA: Insufficient documentation

## 2024-02-14 DIAGNOSIS — R918 Other nonspecific abnormal finding of lung field: Secondary | ICD-10-CM | POA: Diagnosis not present

## 2024-02-27 DIAGNOSIS — S46211A Strain of muscle, fascia and tendon of other parts of biceps, right arm, initial encounter: Secondary | ICD-10-CM | POA: Diagnosis not present

## 2024-03-06 DIAGNOSIS — M25511 Pain in right shoulder: Secondary | ICD-10-CM | POA: Diagnosis not present

## 2024-03-06 DIAGNOSIS — S46211A Strain of muscle, fascia and tendon of other parts of biceps, right arm, initial encounter: Secondary | ICD-10-CM | POA: Diagnosis not present

## 2024-03-07 ENCOUNTER — Other Ambulatory Visit: Payer: Self-pay | Admitting: Student

## 2024-03-07 DIAGNOSIS — S46219A Strain of muscle, fascia and tendon of other parts of biceps, unspecified arm, initial encounter: Secondary | ICD-10-CM

## 2024-03-07 DIAGNOSIS — S46211A Strain of muscle, fascia and tendon of other parts of biceps, right arm, initial encounter: Secondary | ICD-10-CM

## 2024-03-13 ENCOUNTER — Ambulatory Visit
Admission: RE | Admit: 2024-03-13 | Discharge: 2024-03-13 | Disposition: A | Discharge status: Home / Self Care | Source: Ambulatory Visit | Attending: Student | Admitting: Student

## 2024-03-13 DIAGNOSIS — S46211A Strain of muscle, fascia and tendon of other parts of biceps, right arm, initial encounter: Secondary | ICD-10-CM | POA: Diagnosis not present

## 2024-03-13 DIAGNOSIS — S46219A Strain of muscle, fascia and tendon of other parts of biceps, unspecified arm, initial encounter: Secondary | ICD-10-CM | POA: Insufficient documentation

## 2024-03-13 DIAGNOSIS — M19011 Primary osteoarthritis, right shoulder: Secondary | ICD-10-CM | POA: Diagnosis not present

## 2024-03-13 DIAGNOSIS — M7581 Other shoulder lesions, right shoulder: Secondary | ICD-10-CM | POA: Diagnosis not present

## 2024-03-13 DIAGNOSIS — M25411 Effusion, right shoulder: Secondary | ICD-10-CM | POA: Diagnosis not present

## 2024-03-13 DIAGNOSIS — M75121 Complete rotator cuff tear or rupture of right shoulder, not specified as traumatic: Secondary | ICD-10-CM | POA: Diagnosis not present

## 2024-04-05 DIAGNOSIS — M545 Low back pain, unspecified: Secondary | ICD-10-CM | POA: Diagnosis not present

## 2024-04-05 DIAGNOSIS — M461 Sacroiliitis, not elsewhere classified: Secondary | ICD-10-CM | POA: Diagnosis not present

## 2024-04-13 DIAGNOSIS — S46101A Unspecified injury of muscle, fascia and tendon of long head of biceps, right arm, initial encounter: Secondary | ICD-10-CM | POA: Diagnosis not present

## 2024-04-13 DIAGNOSIS — S46011A Strain of muscle(s) and tendon(s) of the rotator cuff of right shoulder, initial encounter: Secondary | ICD-10-CM | POA: Diagnosis not present

## 2024-04-13 DIAGNOSIS — M7581 Other shoulder lesions, right shoulder: Secondary | ICD-10-CM | POA: Diagnosis not present

## 2024-05-03 ENCOUNTER — Other Ambulatory Visit: Payer: Self-pay | Admitting: Internal Medicine

## 2024-05-03 DIAGNOSIS — E782 Mixed hyperlipidemia: Secondary | ICD-10-CM | POA: Diagnosis not present

## 2024-05-03 DIAGNOSIS — F4323 Adjustment disorder with mixed anxiety and depressed mood: Secondary | ICD-10-CM | POA: Diagnosis not present

## 2024-05-03 DIAGNOSIS — Z79899 Other long term (current) drug therapy: Secondary | ICD-10-CM | POA: Diagnosis not present

## 2024-05-03 DIAGNOSIS — I10 Essential (primary) hypertension: Secondary | ICD-10-CM | POA: Diagnosis not present

## 2024-05-03 DIAGNOSIS — R0989 Other specified symptoms and signs involving the circulatory and respiratory systems: Secondary | ICD-10-CM | POA: Diagnosis not present

## 2024-05-03 DIAGNOSIS — R519 Headache, unspecified: Secondary | ICD-10-CM | POA: Diagnosis not present

## 2024-05-03 DIAGNOSIS — E0590 Thyrotoxicosis, unspecified without thyrotoxic crisis or storm: Secondary | ICD-10-CM | POA: Diagnosis not present

## 2024-05-03 DIAGNOSIS — F1021 Alcohol dependence, in remission: Secondary | ICD-10-CM | POA: Diagnosis not present

## 2024-05-03 DIAGNOSIS — J431 Panlobular emphysema: Secondary | ICD-10-CM | POA: Diagnosis not present

## 2024-05-06 ENCOUNTER — Ambulatory Visit
Admission: RE | Admit: 2024-05-06 | Discharge: 2024-05-06 | Disposition: A | Discharge status: Home / Self Care | Source: Ambulatory Visit | Attending: Internal Medicine | Admitting: Internal Medicine

## 2024-05-06 DIAGNOSIS — R519 Headache, unspecified: Secondary | ICD-10-CM | POA: Insufficient documentation

## 2024-05-06 DIAGNOSIS — R930 Abnormal findings on diagnostic imaging of skull and head, not elsewhere classified: Secondary | ICD-10-CM | POA: Diagnosis not present

## 2024-05-29 DIAGNOSIS — R221 Localized swelling, mass and lump, neck: Secondary | ICD-10-CM | POA: Diagnosis not present

## 2024-05-29 DIAGNOSIS — R6889 Other general symptoms and signs: Secondary | ICD-10-CM | POA: Diagnosis not present

## 2024-05-30 ENCOUNTER — Other Ambulatory Visit: Payer: Self-pay | Admitting: Internal Medicine

## 2024-05-30 DIAGNOSIS — R221 Localized swelling, mass and lump, neck: Secondary | ICD-10-CM

## 2024-05-30 DIAGNOSIS — H903 Sensorineural hearing loss, bilateral: Secondary | ICD-10-CM | POA: Diagnosis not present

## 2024-06-06 ENCOUNTER — Ambulatory Visit: Admission: RE | Admit: 2024-06-06 | Discharge: 2024-06-06 | Attending: Internal Medicine | Admitting: Internal Medicine

## 2024-06-06 DIAGNOSIS — R221 Localized swelling, mass and lump, neck: Secondary | ICD-10-CM | POA: Diagnosis present

## 2024-06-26 NOTE — Progress Notes (Signed)
 Regina Carroll                                          MRN: [id number]   06/26/2024   The [facility] Quality Team Specialist reviewed this patient medical record for the purposes of chart review for care gap closure. The following were reviewed: chart review for care gap closure-controlling blood pressure.    [facility] Quality Team
